# Patient Record
Sex: Male | Born: 1950
Health system: Southern US, Community
[De-identification: ages and names within clinical notes are randomized; demographics above are authoritative.]

## PROBLEM LIST (undated history)

## (undated) DIAGNOSIS — I517 Cardiomegaly: Secondary | ICD-10-CM

## (undated) DIAGNOSIS — T7840XA Allergy, unspecified, initial encounter: Secondary | ICD-10-CM

## (undated) DIAGNOSIS — I639 Cerebral infarction, unspecified: Secondary | ICD-10-CM

## (undated) DIAGNOSIS — I10 Essential (primary) hypertension: Secondary | ICD-10-CM

## (undated) DIAGNOSIS — G4700 Insomnia, unspecified: Secondary | ICD-10-CM

## (undated) HISTORY — DX: Essential (primary) hypertension: I10

## (undated) HISTORY — DX: Insomnia, unspecified: G47.00

## (undated) HISTORY — DX: Cerebral infarction, unspecified: I63.9

## (undated) HISTORY — PX: KNEE SURGERY: SHX244

## (undated) HISTORY — DX: Allergy, unspecified, initial encounter: T78.40XA

## (undated) HISTORY — PX: ELBOW SURGERY: SHX618

## (undated) HISTORY — PX: HERNIA REPAIR: SHX51

## (undated) HISTORY — DX: Cardiomegaly: I51.7

## (undated) HISTORY — PX: COLONOSCOPY: SHX174

---

## 2000-02-08 ENCOUNTER — Encounter: Payer: Self-pay | Admitting: Internal Medicine

## 2000-02-08 ENCOUNTER — Encounter: Admission: RE | Admit: 2000-02-08 | Discharge: 2000-02-08 | Payer: Self-pay | Admitting: Internal Medicine

## 2001-09-02 ENCOUNTER — Encounter: Admission: RE | Admit: 2001-09-02 | Discharge: 2001-12-01 | Payer: Self-pay | Admitting: Internal Medicine

## 2002-10-08 LAB — HM COLONOSCOPY

## 2006-08-07 ENCOUNTER — Ambulatory Visit: Payer: Self-pay

## 2007-11-10 ENCOUNTER — Ambulatory Visit: Payer: Self-pay | Admitting: Internal Medicine

## 2008-03-17 ENCOUNTER — Ambulatory Visit: Payer: Self-pay | Admitting: Internal Medicine

## 2008-07-19 ENCOUNTER — Ambulatory Visit: Payer: Self-pay | Admitting: Internal Medicine

## 2008-08-16 ENCOUNTER — Ambulatory Visit: Payer: Self-pay | Admitting: Internal Medicine

## 2008-12-20 ENCOUNTER — Ambulatory Visit: Payer: Self-pay | Admitting: Internal Medicine

## 2009-02-06 ENCOUNTER — Ambulatory Visit: Payer: Self-pay | Admitting: Internal Medicine

## 2009-06-02 ENCOUNTER — Ambulatory Visit: Payer: Self-pay | Admitting: Internal Medicine

## 2009-12-26 ENCOUNTER — Ambulatory Visit: Payer: Self-pay | Admitting: Internal Medicine

## 2010-03-23 ENCOUNTER — Ambulatory Visit: Payer: BC Managed Care – PPO | Admitting: Internal Medicine

## 2010-06-19 ENCOUNTER — Other Ambulatory Visit: Payer: Self-pay | Admitting: Internal Medicine

## 2010-06-19 DIAGNOSIS — I10 Essential (primary) hypertension: Secondary | ICD-10-CM

## 2010-06-29 ENCOUNTER — Encounter: Payer: Self-pay | Admitting: Internal Medicine

## 2010-06-29 ENCOUNTER — Ambulatory Visit (INDEPENDENT_AMBULATORY_CARE_PROVIDER_SITE_OTHER): Payer: PRIVATE HEALTH INSURANCE | Admitting: Internal Medicine

## 2010-06-29 VITALS — BP 142/84 | HR 64 | Temp 97.2°F | Ht 64.25 in | Wt 200.0 lb

## 2010-06-29 DIAGNOSIS — E119 Type 2 diabetes mellitus without complications: Secondary | ICD-10-CM | POA: Insufficient documentation

## 2010-06-29 DIAGNOSIS — I10 Essential (primary) hypertension: Secondary | ICD-10-CM | POA: Insufficient documentation

## 2010-06-29 LAB — HEMOGLOBIN A1C
Hgb A1c MFr Bld: 6.7 % — ABNORMAL HIGH (ref <5.7)
Mean Plasma Glucose: 146 mg/dL — ABNORMAL HIGH (ref <117)

## 2010-06-29 NOTE — Patient Instructions (Signed)
Take meds as directed. Return in 6 months. Diet and exercise

## 2010-06-29 NOTE — Progress Notes (Signed)
  Subjective:    Patient ID: Mike Parker, male    DOB: August 30, 1950, 60 y.o.   MRN: [id number]  HPI For 6 month follow up of AODM and HTN. Has lost 5 lbs in past year. 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 Eating better. Diabetic eye exam Nov.2011.    Review of Systems     Objective:   Physical Exam BP rechecked is 120/70. Chest clear, Cor:RRR, Ext without edema. Diabetic foot exam: No ulcers. Pulses normal.        Assessment & Plan:  1-HTN-continue same meds 2-AODM- HGB AIC pending. 3-Obesity- continue diet and exercise. RTC in 6 months for CPE.

## 2010-07-02 ENCOUNTER — Encounter: Payer: Self-pay | Admitting: Internal Medicine

## 2010-07-12 ENCOUNTER — Telehealth: Payer: Self-pay | Admitting: Internal Medicine

## 2010-07-13 ENCOUNTER — Other Ambulatory Visit: Payer: Self-pay

## 2010-07-13 MED ORDER — GLIPIZIDE 10 MG PO TABS: 10.0000 mg | ORAL_TABLET | Freq: Two times a day (BID) | ORAL | Status: DC

## 2010-07-13 NOTE — Telephone Encounter (Signed)
Please route these requests to [place] or [place]. The chart has to be pulled for me to do his written request.

## 2010-09-16 ENCOUNTER — Other Ambulatory Visit: Payer: Self-pay | Admitting: Internal Medicine

## 2010-09-16 DIAGNOSIS — I10 Essential (primary) hypertension: Secondary | ICD-10-CM

## 2011-01-01 ENCOUNTER — Ambulatory Visit (INDEPENDENT_AMBULATORY_CARE_PROVIDER_SITE_OTHER): Payer: PRIVATE HEALTH INSURANCE | Admitting: Internal Medicine

## 2011-01-01 ENCOUNTER — Encounter: Payer: Self-pay | Admitting: Internal Medicine

## 2011-01-01 DIAGNOSIS — I10 Essential (primary) hypertension: Secondary | ICD-10-CM

## 2011-01-01 DIAGNOSIS — E669 Obesity, unspecified: Secondary | ICD-10-CM

## 2011-01-01 DIAGNOSIS — Z0000 Encounter for general adult medical examination without abnormal findings: Secondary | ICD-10-CM

## 2011-01-01 LAB — LIPID PANEL
Cholesterol: 144 mg/dL (ref 0–200)
Total CHOL/HDL Ratio: 3.4 Ratio
Triglycerides: 107 mg/dL (ref <150)
VLDL: 21 mg/dL (ref 0–40)

## 2011-01-01 LAB — COMPREHENSIVE METABOLIC PANEL
ALT: 30 U/L (ref 0–53)
CO2: 30 mEq/L (ref 19–32)
Creat: 1.08 mg/dL (ref 0.50–1.35)
Glucose, Bld: 87 mg/dL (ref 70–99)
Total Bilirubin: 1.2 mg/dL (ref 0.3–1.2)

## 2011-01-01 LAB — CBC WITH DIFFERENTIAL/PLATELET
Eosinophils Absolute: 0.2 10*3/uL (ref 0.0–0.7)
Eosinophils Relative: 3 % (ref 0–5)
Lymphs Abs: 1.9 10*3/uL (ref 0.7–4.0)
MCH: 26.3 pg (ref 26.0–34.0)
MCHC: 32.6 g/dL (ref 30.0–36.0)
MCV: 80.6 fL (ref 78.0–100.0)
Platelets: 219 10*3/uL (ref 150–400)
RBC: 5.82 MIL/uL — ABNORMAL HIGH (ref 4.22–5.81)

## 2011-01-01 LAB — POCT URINALYSIS DIPSTICK
Bilirubin, UA: NEGATIVE
Blood, UA: NEGATIVE
Glucose, UA: NEGATIVE
Ketones, UA: NEGATIVE
Spec Grav, UA: 1.01

## 2011-01-02 DIAGNOSIS — E669 Obesity, unspecified: Secondary | ICD-10-CM | POA: Insufficient documentation

## 2011-01-02 NOTE — Patient Instructions (Signed)
We will make appointment for you to see urologist. Please continue to control diabetes with diet. Watch her diet. Continue taking same antihypertensive medication. Return in 6 months.

## 2011-01-02 NOTE — Progress Notes (Signed)
  Subjective:    Patient ID: Mike Parker, male    DOB: February 24, 1950, 60 y.o.   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 History of hypertension and diabetes. Is overweight. Has issues losing weight. Gets a fair amount of exercise with his work. Likes to snack at night and watch television. No other complaints or problems. Influenza immunization given today.  He is married. Has 2 adult daughters one of whom attends [employer] and the other one is attending [employer] school. She has a 53-year-old daughter and she and her daughter reside with Mike Parker and his Parker. Mike Parker works for  [employer]. Patient does not smoke or consume alcohol.    Review of Systems  Constitutional: Negative.   HENT: Negative.   Eyes: Negative.   Respiratory: Negative.   Cardiovascular: Negative.   Gastrointestinal: Negative.   Genitourinary: Negative.   Musculoskeletal: Negative.   Neurological: Negative.   Hematological: Negative.   Psychiatric/Behavioral: Negative.        Objective:   Physical Exam  Vitals reviewed. Constitutional: He is oriented to person, place, and time. He appears well-developed and well-nourished.  HENT:  Head: Normocephalic and atraumatic.  Right Ear: External ear normal.  Left Ear: External ear normal.  Mouth/Throat: Oropharynx is clear and moist.  Eyes: Conjunctivae and EOM are normal. Pupils are equal, round, and reactive to light. No scleral icterus.  Neck: Neck supple. No JVD present. No thyromegaly present.  Cardiovascular: Normal rate, regular rhythm, normal heart sounds and intact distal pulses.   No murmur heard. Pulmonary/Chest: Breath sounds normal. He has no wheezes. He has no rales.  Abdominal: Soft. Bowel sounds are normal. He exhibits no distension and no mass. There is no rebound and no guarding.  Genitourinary:       There is a slight ridge right lobe of prostate. No  frank nodule palpated. This seems to be a new finding.  Musculoskeletal: Normal range of motion. He exhibits no edema.       Diabetic foot exam: No ulcers pulses intact in feet  Lymphadenopathy:    He has no cervical adenopathy.  Neurological: He is alert and oriented to person, place, and time. He has normal reflexes. No cranial nerve deficit. Coordination normal.  Skin: Skin is warm and dry. No rash noted. He is not diaphoretic.  Psychiatric: He has a normal mood and affect. His behavior is normal.          Assessment & Plan:  Diet controlled diabetes mellitus  Hypertension  Obesity  New finding of ridge right lobe of prostate  Plan: Urology consult. Fasting labs drawn and are pending. Influenza immunization given. Further recommendations based on lab work findings. Return in 6 months at which time he'll have hemoglobin A1c and blood pressure check along with office visit.

## 2011-01-04 ENCOUNTER — Telehealth: Payer: Self-pay

## 2011-01-04 NOTE — Telephone Encounter (Signed)
Patient scheduled for an appointment with Dr. Retta Diones on February 15, 2011 at 1:30pm. Informed of this appointment.

## 2011-02-12 ENCOUNTER — Ambulatory Visit (INDEPENDENT_AMBULATORY_CARE_PROVIDER_SITE_OTHER): Payer: PRIVATE HEALTH INSURANCE

## 2011-02-12 DIAGNOSIS — H6500 Acute serous otitis media, unspecified ear: Secondary | ICD-10-CM

## 2011-03-18 ENCOUNTER — Ambulatory Visit (INDEPENDENT_AMBULATORY_CARE_PROVIDER_SITE_OTHER): Payer: PRIVATE HEALTH INSURANCE | Admitting: Internal Medicine

## 2011-03-18 ENCOUNTER — Encounter: Payer: Self-pay | Admitting: Internal Medicine

## 2011-03-18 VITALS — BP 162/86 | Temp 97.5°F | Wt 215.0 lb

## 2011-03-18 DIAGNOSIS — H6693 Otitis media, unspecified, bilateral: Secondary | ICD-10-CM

## 2011-03-18 DIAGNOSIS — I10 Essential (primary) hypertension: Secondary | ICD-10-CM

## 2011-03-18 DIAGNOSIS — E119 Type 2 diabetes mellitus without complications: Secondary | ICD-10-CM

## 2011-03-18 DIAGNOSIS — H6690 Otitis media, unspecified, unspecified ear: Secondary | ICD-10-CM

## 2011-03-18 DIAGNOSIS — J069 Acute upper respiratory infection, unspecified: Secondary | ICD-10-CM

## 2011-03-18 MED ORDER — SITAGLIPTIN PHOS-METFORMIN HCL 50-500 MG PO TABS: 1.0000 | ORAL_TABLET | Freq: Two times a day (BID) | ORAL | Status: DC

## 2011-03-20 ENCOUNTER — Encounter: Payer: Self-pay | Admitting: Internal Medicine

## 2011-03-20 NOTE — Patient Instructions (Addendum)
You have been provided with samples of Janumet. Mail order prescription has been filled out. For ear infection and respiratory infection take Biaxin 500 mg twice daily for 10 days. Call if not better in 10 days or sooner if worse.

## 2011-03-20 NOTE — Progress Notes (Signed)
  Subjective:    Patient ID: Mike Parker, male    DOB: Jul 10, 1950, 61 y.o.   MRN: [id number]  HPI  Patient came in today needing a 90 day prescription for Janumet. Complaining of right earache. Was seen on an acute basis. Patient has history of hypertension and diabetes mellitus. Has acute pain right ear. Says about a month ago, he was treated at an urgent care with amoxicillin but symptoms returned over the weekend. No fever or chills.    Review of Systems     Objective:   Physical Exam HEENT exam: Pharynx slightly injected; right TM is red; left TM dull and full; neck is supple without significant adenopathy; chest clear. Blood pressure is elevated presumably due to ear pain.        Assessment & Plan:  Bilateral otitis media  Upper respiratory infection  Hypertension  Diabetes mellitus  Plan: Samples of Janumet given today and prescription for mail order pharmacy provided. Prescribed Biaxin 500 mg by mouth twice daily for 10 days.

## 2011-04-11 ENCOUNTER — Ambulatory Visit (INDEPENDENT_AMBULATORY_CARE_PROVIDER_SITE_OTHER): Payer: PRIVATE HEALTH INSURANCE | Admitting: Family Medicine

## 2011-04-11 VITALS — BP 154/100 | HR 102 | Temp 97.7°F | Resp 18

## 2011-04-11 DIAGNOSIS — H6980 Other specified disorders of Eustachian tube, unspecified ear: Secondary | ICD-10-CM

## 2011-04-11 DIAGNOSIS — H9190 Unspecified hearing loss, unspecified ear: Secondary | ICD-10-CM

## 2011-04-11 DIAGNOSIS — H9120 Sudden idiopathic hearing loss, unspecified ear: Secondary | ICD-10-CM

## 2011-04-11 MED ORDER — FLUTICASONE PROPIONATE 50 MCG/ACT NA SUSP: 2.0000 | Freq: Every day | NASAL | Status: DC

## 2011-04-11 MED ORDER — PREDNISONE 20 MG PO TABS: ORAL_TABLET | ORAL | Status: AC

## 2011-04-11 NOTE — Progress Notes (Signed)
  Subjective:    Patient ID: Mike Parker, male    DOB: [birth year], 61 y.o.   MRN: [id number]  HPI 61 yo male with diabetes and hypertension with ear pain/pressure.  Had ear infection about 4-6 weeks ago.  Pain improved but never fully got hearing back.  Last 4-5 days seems more stopped up again.  Feeling some pressure but no real pain.  No fevers.  No cough or runny nose.   Diabetes under good control - 118 today and A1C 6.2  Review of Systems Negative except as per HPI     Objective:   Physical Exam  Constitutional: He appears well-developed. No distress.  HENT:  Right Ear: Tympanic membrane, external ear and ear canal normal. Tympanic membrane is not injected, not scarred, not perforated, not erythematous, not retracted and not bulging.  Left Ear: Tympanic membrane, external ear and ear canal normal. Tympanic membrane is not injected, not scarred, not perforated, not erythematous, not retracted and not bulging. Decreased hearing is noted.  Nose: No mucosal edema or rhinorrhea. Right sinus exhibits no maxillary sinus tenderness and no frontal sinus tenderness. Left sinus exhibits no maxillary sinus tenderness and no frontal sinus tenderness.  Mouth/Throat: Uvula is midline, oropharynx is clear and moist and mucous membranes are normal. No oropharyngeal exudate or tonsillar abscesses.  Cardiovascular: Normal rate, regular rhythm, normal heart sounds and intact distal pulses.   No murmur heard. Pulmonary/Chest: Effort normal and breath sounds normal. No respiratory distress. He has no wheezes. He has no rales.  Lymphadenopathy:       Head (right side): No submandibular and no preauricular adenopathy present.       Head (left side): No submandibular and no preauricular adenopathy present.       Right cervical: No superficial cervical and no posterior cervical adenopathy present.      Left cervical: No superficial cervical and no posterior cervical adenopathy present.       Right: No  supraclavicular adenopathy present.       Left: No supraclavicular adenopathy present.  Skin: Skin is warm and dry.   Left TM dull with fluid behind drum Right TM slight fluid, otherwise normal       Assessment & Plan:  Ear pain/decreased hearing/ETD pred taper flonase

## 2011-05-09 ENCOUNTER — Encounter: Payer: Self-pay | Admitting: Internal Medicine

## 2011-05-09 ENCOUNTER — Ambulatory Visit (INDEPENDENT_AMBULATORY_CARE_PROVIDER_SITE_OTHER): Payer: PRIVATE HEALTH INSURANCE | Admitting: Internal Medicine

## 2011-05-09 VITALS — BP 136/90 | HR 76 | Temp 98.4°F | Wt 210.0 lb

## 2011-05-09 DIAGNOSIS — H6592 Unspecified nonsuppurative otitis media, left ear: Secondary | ICD-10-CM

## 2011-05-09 DIAGNOSIS — J309 Allergic rhinitis, unspecified: Secondary | ICD-10-CM

## 2011-05-09 DIAGNOSIS — H6590 Unspecified nonsuppurative otitis media, unspecified ear: Secondary | ICD-10-CM

## 2011-05-09 NOTE — Patient Instructions (Addendum)
Take Biaxin 500 mg twice daily for 10 days. Take six-day prednisone tapering course as directed. When she finished prednisone course, start Zyrtec 10 mg at bedtime. Use Flonase nasal spray 2 sprays in each nostril daily. Call back if not better in 2-3 weeks

## 2011-05-09 NOTE — Progress Notes (Signed)
  Subjective:    Patient ID: Mike Parker, male    DOB: [birth year], 61 y.o.   MRN: [id number]  HPI 61 year old black male with history of diabetes mellitus and hypertension in today with recurrent left ear pain. In mid February he was here complaining of left ear pain having had been recently treated at urgent care with amoxicillin which did not clear his symptoms. He was treated here in mid February with Biaxin 500 mg twice daily for 10 days. He says this helped. In mid March he went back to urgent care with left ear pain. He was treated with a short course of prednisone in a dosepak. He said this did not help very much. He is in today complaining of left ear pain once again. 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 Feels that he does have some allergy symptoms. The pollen is very bad right now. No fever. No cough or sore throat. No UTIs.    Review of Systems     Objective:   Physical Exam HEENT exam: He has very boggy nasal mucosa bilaterally. Right TM is clear with good light reflex. Left TM is chronically scarred and dull. Pharynx is clear. Neck is supple without adenopathy. Chest clear.        Assessment & Plan:  Left otitis media  Allergic rhinitis  Hypertension  Diabetes mellitus  Plan: Sterapred DS 10 mg 6 day dosepak. Biaxin 500 mg twice daily for 10 days. Once he finishes Sterapred dosepak he is to start immediately on Zyrtec 10 mg at bedtime. Flonase nasal spray 2 sprays in each nostril daily through the pollen season in late May early June. If symptoms persist, he will be referred for allergy testing/ENT referral.

## 2011-06-03 ENCOUNTER — Telehealth: Payer: Self-pay

## 2011-06-03 NOTE — Telephone Encounter (Signed)
Appointment scheduled with Dr. Lazarus Salines for Tues 06/03/2011 at 10:30. Patient informed, notes faxed.

## 2011-06-03 NOTE — Telephone Encounter (Signed)
Please make appt for patient to see Dr. Lazarus Salines. In serous otitis media refractory to Prednisone and antibiotics. Please send notes in EPIC. There are several including last Hx and PE

## 2011-06-03 NOTE — Telephone Encounter (Signed)
States left ear is no better, would like ref. To ENT

## 2011-06-13 ENCOUNTER — Other Ambulatory Visit: Payer: Self-pay

## 2011-06-13 DIAGNOSIS — I10 Essential (primary) hypertension: Secondary | ICD-10-CM

## 2011-07-04 ENCOUNTER — Ambulatory Visit (INDEPENDENT_AMBULATORY_CARE_PROVIDER_SITE_OTHER): Payer: PRIVATE HEALTH INSURANCE | Admitting: Internal Medicine

## 2011-07-04 ENCOUNTER — Encounter: Payer: Self-pay | Admitting: Internal Medicine

## 2011-07-04 VITALS — BP 132/82 | HR 72 | Temp 97.9°F | Wt 212.0 lb

## 2011-07-04 DIAGNOSIS — H6590 Unspecified nonsuppurative otitis media, unspecified ear: Secondary | ICD-10-CM

## 2011-07-04 DIAGNOSIS — E119 Type 2 diabetes mellitus without complications: Secondary | ICD-10-CM

## 2011-07-04 DIAGNOSIS — I10 Essential (primary) hypertension: Secondary | ICD-10-CM

## 2011-07-04 LAB — BASIC METABOLIC PANEL
BUN: 15 mg/dL (ref 6–23)
Calcium: 9.6 mg/dL (ref 8.4–10.5)
Potassium: 4.1 mEq/L (ref 3.5–5.3)
Sodium: 142 mEq/L (ref 135–145)

## 2011-07-04 NOTE — Patient Instructions (Addendum)
Please check Accu-Cheks at least once daily if not twice daily before breakfast and before supper. Watch diet. Try to lose weight and exercise. Continue same antihypertensive medication. Return in 6 months for physical exam.

## 2011-07-04 NOTE — Progress Notes (Signed)
  Subjective:    Patient ID: Mike Parker, male    DOB: 07-21-1950, 61 y.o.   MRN: [id number]  HPI 61 year old black male with history of hypertension and diabetes for six-month recheck. Under some stress with car repairs for his daughters. He went to see ENT physician regarding otalgia. He may have to have a tube placed in his ear. He has appointment in the near future for followup on that. He's been advised to use Zyrtec and Flonase nasal spray and to clear his ears by holding his nose in blowing several times daily. He has not been checking his Accu-Cheks on her regular basis. Says he ran out of test strips. New prescription today for diabetic test strips with when necessary 1 year refill.    Review of Systems     Objective:   Physical Exam neck is supple without thyromegaly or JVD; chest clear; cardiac exam regular rate and rhythm; extremities without edema. Diabetic foot exam: no ulcers, pulses are normal  HEENT: Slight fullness in both ears      Assessment & Plan:  Type 2 diabetes mellitus-fairly well controlled but needs to be serious about checking Accu-Cheks on a regular basis  Allergic rhinitis-consider allergy testing  Hypertension-stable  Obesity-needs to diet and exercise  Serous otitis media-followup with ENT  Plan: Patient will return in 6 months for physical exam. Fasting labs drawn today and are pending. New prescription for diabetic test strips.

## 2011-07-05 LAB — HEMOGLOBIN A1C: Hgb A1c MFr Bld: 7.2 % — ABNORMAL HIGH (ref <5.7)

## 2011-08-25 ENCOUNTER — Other Ambulatory Visit: Payer: Self-pay | Admitting: Internal Medicine

## 2011-10-18 ENCOUNTER — Other Ambulatory Visit: Payer: Self-pay | Admitting: Internal Medicine

## 2012-01-06 ENCOUNTER — Other Ambulatory Visit: Payer: PRIVATE HEALTH INSURANCE | Admitting: Internal Medicine

## 2012-01-06 DIAGNOSIS — I10 Essential (primary) hypertension: Secondary | ICD-10-CM

## 2012-01-06 DIAGNOSIS — Z125 Encounter for screening for malignant neoplasm of prostate: Secondary | ICD-10-CM

## 2012-01-06 DIAGNOSIS — E119 Type 2 diabetes mellitus without complications: Secondary | ICD-10-CM

## 2012-01-06 LAB — COMPREHENSIVE METABOLIC PANEL
ALT: 36 U/L (ref 0–53)
AST: 21 U/L (ref 0–37)
Albumin: 4.3 g/dL (ref 3.5–5.2)
CO2: 28 mEq/L (ref 19–32)
Calcium: 9.7 mg/dL (ref 8.4–10.5)
Chloride: 102 mEq/L (ref 96–112)
Creat: 1.13 mg/dL (ref 0.50–1.35)
Potassium: 4.1 mEq/L (ref 3.5–5.3)
Sodium: 141 mEq/L (ref 135–145)
Total Protein: 6.8 g/dL (ref 6.0–8.3)

## 2012-01-06 LAB — HEMOGLOBIN A1C
Hgb A1c MFr Bld: 7.2 % — ABNORMAL HIGH (ref <5.7)
Mean Plasma Glucose: 160 mg/dL — ABNORMAL HIGH (ref <117)

## 2012-01-06 LAB — CBC WITH DIFFERENTIAL/PLATELET
Eosinophils Relative: 3 % (ref 0–5)
Lymphocytes Relative: 24 % (ref 12–46)
Lymphs Abs: 1.7 10*3/uL (ref 0.7–4.0)
MCV: 78.1 fL (ref 78.0–100.0)
Neutro Abs: 4.4 10*3/uL (ref 1.7–7.7)
Platelets: 213 10*3/uL (ref 150–400)
RBC: 5.71 MIL/uL (ref 4.22–5.81)
WBC: 7 10*3/uL (ref 4.0–10.5)

## 2012-01-06 LAB — PSA: PSA: 1.05 ng/mL (ref <=4.00)

## 2012-01-06 LAB — LIPID PANEL: Cholesterol: 142 mg/dL (ref 0–200)

## 2012-01-07 ENCOUNTER — Ambulatory Visit (INDEPENDENT_AMBULATORY_CARE_PROVIDER_SITE_OTHER): Payer: PRIVATE HEALTH INSURANCE | Admitting: Internal Medicine

## 2012-01-07 ENCOUNTER — Encounter: Payer: Self-pay | Admitting: Internal Medicine

## 2012-01-07 VITALS — BP 138/84 | HR 72 | Resp 12 | Ht 64.0 in | Wt 213.0 lb

## 2012-01-07 DIAGNOSIS — Z23 Encounter for immunization: Secondary | ICD-10-CM

## 2012-01-07 DIAGNOSIS — E669 Obesity, unspecified: Secondary | ICD-10-CM

## 2012-01-07 DIAGNOSIS — E119 Type 2 diabetes mellitus without complications: Secondary | ICD-10-CM

## 2012-01-07 DIAGNOSIS — I10 Essential (primary) hypertension: Secondary | ICD-10-CM

## 2012-01-07 LAB — POCT URINALYSIS DIPSTICK
Bilirubin, UA: NEGATIVE
Blood, UA: NEGATIVE
Leukocytes, UA: NEGATIVE
Nitrite, UA: NEGATIVE
Protein, UA: NEGATIVE
pH, UA: 6

## 2012-01-07 MED ORDER — GLIPIZIDE ER 10 MG PO TB24: 10.0000 mg | ORAL_TABLET | Freq: Every day | ORAL | Status: DC

## 2012-01-29 DIAGNOSIS — I639 Cerebral infarction, unspecified: Secondary | ICD-10-CM

## 2012-01-29 HISTORY — DX: Cerebral infarction, unspecified: I63.9

## 2012-03-10 ENCOUNTER — Ambulatory Visit (INDEPENDENT_AMBULATORY_CARE_PROVIDER_SITE_OTHER): Payer: PRIVATE HEALTH INSURANCE | Admitting: Emergency Medicine

## 2012-03-10 VITALS — BP 164/95 | HR 81 | Temp 98.5°F | Resp 18 | Wt 221.0 lb

## 2012-03-10 DIAGNOSIS — M67919 Unspecified disorder of synovium and tendon, unspecified shoulder: Secondary | ICD-10-CM

## 2012-03-10 DIAGNOSIS — M7550 Bursitis of unspecified shoulder: Secondary | ICD-10-CM

## 2012-03-10 DIAGNOSIS — M25519 Pain in unspecified shoulder: Secondary | ICD-10-CM

## 2012-03-10 DIAGNOSIS — S139XXA Sprain of joints and ligaments of unspecified parts of neck, initial encounter: Secondary | ICD-10-CM

## 2012-03-10 MED ORDER — CYCLOBENZAPRINE HCL 10 MG PO TABS: 10.0000 mg | ORAL_TABLET | Freq: Three times a day (TID) | ORAL | Status: DC | PRN

## 2012-03-10 MED ORDER — METHYLPREDNISOLONE ACETATE 80 MG/ML IJ SUSP: 80.0000 mg | Freq: Once | INTRAMUSCULAR | Status: DC

## 2012-03-10 MED ORDER — NAPROXEN SODIUM 550 MG PO TABS: 550.0000 mg | ORAL_TABLET | Freq: Two times a day (BID) | ORAL | Status: DC

## 2012-03-10 NOTE — Patient Instructions (Addendum)
Cervical Sprain A cervical sprain is an injury in the neck in which the ligaments are stretched or torn. The ligaments are the tissues that hold the bones of the neck (vertebrae) in place.Cervical sprains can range from very mild to very severe. Most cervical sprains get better in 1 to 3 weeks, but it depends on the cause and extent of the injury. Severe cervical sprains can cause the neck vertebrae to be unstable. This can lead to damage of the spinal cord and can result in serious nervous system problems. Your caregiver will determine whether your cervical sprain is mild or severe. CAUSES  Severe cervical sprains may be caused by:  Contact sport injuries (football, rugby, wrestling, hockey, auto racing, gymnastics, diving, martial arts, boxing).  Motor vehicle collisions.  Whiplash injuries. This means the neck is forcefully whipped backward and forward.  Falls. Mild cervical sprains may be caused by:   Awkward positions, such as cradling a telephone between your ear and shoulder.  Sitting in a chair that does not offer proper support.  Working at a poorly designed computer station.  Activities that require looking up or down for long periods of time. SYMPTOMS   Pain, soreness, stiffness, or a burning sensation in the front, back, or sides of the neck. This discomfort may develop immediately after injury or it may develop slowly and not begin for 24 hours or more after an injury.  Pain or tenderness directly in the middle of the back of the neck.  Shoulder or upper back pain.  Limited ability to move the neck.  Headache.  Dizziness.  Weakness, numbness, or tingling in the hands or arms.  Muscle spasms.  Difficulty swallowing or chewing.  Tenderness and swelling of the neck. DIAGNOSIS  Most of the time, your caregiver can diagnose this problem by taking your history and doing a physical exam. Your caregiver will ask about any known problems, such as arthritis in the neck  or a previous neck injury. X-rays may be taken to find out if there are any other problems, such as problems with the bones of the neck. However, an X-ray often does not reveal the full extent of a cervical sprain. Other tests such as a computed tomography (CT) scan or magnetic resonance imaging (MRI) may be needed. TREATMENT  Treatment depends on the severity of the cervical sprain. Mild sprains can be treated with rest, keeping the neck in place (immobilization), and pain medicines. Severe cervical sprains need immediate immobilization and an appointment with an orthopedist or neurosurgeon. Several treatment options are available to help with pain, muscle spasms, and other symptoms. Your caregiver may prescribe:  Medicines, such as pain relievers, numbing medicines, or muscle relaxants.  Physical therapy. This can include stretching exercises, strengthening exercises, and posture training. Exercises and improved posture can help stabilize the neck, strengthen muscles, and help stop symptoms from returning.  A neck collar to be worn for short periods of time. Often, these collars are worn for comfort. However, certain collars may be worn to protect the neck and prevent further worsening of a serious cervical sprain. HOME CARE INSTRUCTIONS   Put ice on the injured area.  Put ice in a plastic bag.  Place a towel between your skin and the bag.  Leave the ice on for 15 to 20 minutes, 3 to 4 times a day.  Only take over-the-counter or prescription medicines for pain, discomfort, or fever as directed by your caregiver.  Keep all follow-up appointments as directed by your   caregiver.  Keep all physical therapy appointments as directed by your caregiver.  If a neck collar is prescribed, wear it as directed by your caregiver.  Do not drive while wearing a neck collar.  Make any needed adjustments to your work station to promote good posture.  Avoid positions and activities that make your  symptoms worse.  Warm up and stretch before being active to help prevent problems. SEEK MEDICAL CARE IF:   Your pain is not controlled with medicine.  You are unable to decrease your pain medicine over time as planned.  Your activity level is not improving as expected. SEEK IMMEDIATE MEDICAL CARE IF:   You develop any bleeding, stomach upset, or signs of an allergic reaction to your medicine.  Your symptoms get worse.  You develop new, unexplained symptoms.  You have numbness, tingling, weakness, or paralysis in any part of your body. MAKE SURE YOU:   Understand these instructions.  Will watch your condition.  Will get help right away if you are not doing well or get worse. Document Released: 11/11/2006 Document Revised: 04/08/2011 Document Reviewed: 10/17/2010 [facility] Patient Information 2013 [place], [place]. Bursitis Bursitis is a swelling and soreness (inflammation) of a fluid-filled sac (bursa) that overlies and protects a joint. It can be caused by injury, overuse of the joint, arthritis or infection. The joints most likely to be affected are the elbows, shoulders, hips and knees. HOME CARE INSTRUCTIONS   Apply ice to the affected area for 15 to 20 minutes each hour while awake for 2 days. Put the ice in a plastic bag and place a towel between the bag of ice and your skin.  Rest the injured joint as much as possible, but continue to put the joint through a full range of motion, 4 times per day. (The shoulder joint especially becomes rapidly "frozen" if not used.) When the pain lessens, begin normal slow movements and usual activities.  Only take over-the-counter or prescription medicines for pain, discomfort or fever as directed by your caregiver.  Your caregiver may recommend draining the bursa and injecting medicine into the bursa. This may help the healing process.  Follow all instructions for follow-up with your caregiver. This includes any orthopedic referrals,  physical therapy and rehabilitation. Any delay in obtaining necessary care could result in a delay or failure of the bursitis to heal and chronic pain. SEEK IMMEDIATE MEDICAL CARE IF:   Your pain increases even during treatment.  You develop an oral temperature above 102 F (38.9 C) and have heat and inflammation over the involved bursa. MAKE SURE YOU:   Understand these instructions.  Will watch your condition.  Will get help right away if you are not doing well or get worse. Document Released: 01/12/2000 Document Revised: 04/08/2011 Document Reviewed: 12/16/2008 [facility] Patient Information 2013 [place], [place].

## 2012-03-10 NOTE — Progress Notes (Signed)
[facility] [address] [place] [address] [phone]  Date:  03/10/2012   Name:  Mike Parker   DOB:  01-07-1951   MRN:  [id number]  PCP:  Margaree Mackintosh, MD    Chief Complaint: Neck Pain and Shoulder Pain   History of Present Illness:  Mike Parker is a 62 y.o. 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  Says the package slipped and he was unable to hold it with his right arm.  Has persistently been painful.  Also has pain in neck when he awoke a week ago.  Has pain in neck.  No radiation of pain or numbness, weakness or tingling in arm or leg.  No improvement with NSAID.  Can't turn his head to the left.  Patient Active Problem List  Diagnosis  . Hypertension  . Diabetes mellitus  . Obesity  . Serous otitis media    Past Medical History  Diagnosis Date  . Hypertension   . Allergy   . Diabetes mellitus   . Insomnia     Past Surgical History  Procedure Laterality Date  . Hernia repair      left inguinal  . Knee surgery      rt and left  . Elbow surgery      rt elbow    History  Substance Use Topics  . Smoking status: Never Smoker   . Smokeless tobacco: Not on file  . Alcohol Use: No    Family History  Problem Relation Age of Onset  . Stroke Mother   . Hypertension Brother     No Known Allergies  Medication list has been reviewed and updated.  Current Outpatient Prescriptions on File Prior to Visit  Medication Sig Dispense Refill  . amLODipine (NORVASC) 5 MG tablet TAKE ONE TABLET BY MOUTH EVERY DAY  90 tablet  3  . atenolol (TENORMIN) 50 MG tablet Take 50 mg by mouth daily.        Marland Kitchen FREESTYLE LITE test strip       . glipiZIDE (GLUCOTROL XL) 10 MG 24 hr tablet Take 1 tablet (10 mg total) by mouth daily.  90 tablet  3  . Lancets (FREESTYLE) lancets       . lisinopril-hydrochlorothiazide  (PRINZIDE,ZESTORETIC) 20-25 MG per tablet TAKE ONE TABLET BY MOUTH EVERY DAY  90 tablet  3  . sitaGLIPtan-metformin (JANUMET) 50-500 MG per tablet Take 1 tablet by mouth 2 (two) times daily with a meal.  180 tablet  3  . cetirizine (ZYRTEC) 10 MG tablet Take 10 mg by mouth daily as needed.        . fluticasone (FLONASE) 50 MCG/ACT nasal spray Place 2 sprays into the nose daily.  16 g  6   No current facility-administered medications on file prior to visit.    Review of Systems:  As per HPI, otherwise negative.    Physical Examination: Filed Vitals:   03/10/12 0759  BP: 164/95  Pulse: 81  Temp: 98.5 F (36.9 C)  Resp: 18   Filed Vitals:   03/10/12 0759  Weight: 221 lb (100.245 kg)   Body mass index is 37.92 kg/(m^2). Ideal Body Weight:     GEN: WDWN, NAD, Non-toxic, Alert & Oriented x 3 HEENT: Atraumatic, Normocephalic.  Ears and Nose: No external deformity. EXTR: No clubbing/cyanosis/edema NEURO:  Normal gait.  PSYCH: Normally interactive. Conversant. Not depressed or anxious appearing.  Calm demeanor.  Neck:  Tender right trapezius with spasm. RIGHT shoulder:  Tender anterior shoulder bursa.  Limited elevation of arm.  Can't raise overhead  Assessment and Plan: Shoulder bursitis Cervical strain Inject shoulder bursa with 1 ml depo medrol and 1 ml marcaine 2% Anaprox Flexeril Local heat  [name], MD

## 2012-03-20 ENCOUNTER — Other Ambulatory Visit: Payer: Self-pay

## 2012-03-20 DIAGNOSIS — E119 Type 2 diabetes mellitus without complications: Secondary | ICD-10-CM

## 2012-03-20 MED ORDER — ATENOLOL 50 MG PO TABS: 50.0000 mg | ORAL_TABLET | Freq: Every day | ORAL | Status: DC

## 2012-03-20 MED ORDER — GLIPIZIDE ER 10 MG PO TB24: 10.0000 mg | ORAL_TABLET | Freq: Every day | ORAL | Status: DC

## 2012-04-26 NOTE — Patient Instructions (Addendum)
Continue same meds and return in 6 months Try to diet, exercise and lose weight.

## 2012-04-26 NOTE — Progress Notes (Signed)
  Subjective:    Patient ID: Mike Parker, male    DOB: [birth year], 62 y.o.   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 History of HTN and AODM. Is overweight and does not stick to strict diabetic diet. 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 Does not smoke or consume alcohol. Married with 2 adult daughters one of whom attends [employer]. One granddaughter.    Review of Systems  Constitutional: Negative.   HENT: Negative.   Eyes: Negative.   Respiratory: Negative.   Cardiovascular:       HTN  Gastrointestinal: Negative.   Endocrine:       Snacks at night and on weekends- likes potato chips  Allergic/Immunologic: Negative.   Neurological: Negative.   Hematological: Negative.   Psychiatric/Behavioral: Negative.        Objective:   Physical Exam  Vitals reviewed. Constitutional: He is oriented to person, place, and time. He appears well-developed and well-nourished. No distress.  HENT:  Head: Normocephalic and atraumatic.  Right Ear: External ear normal.  Left Ear: External ear normal.  Mouth/Throat: Oropharynx is clear and moist. No oropharyngeal exudate.  Eyes: Conjunctivae and EOM are normal. Pupils are equal, round, and reactive to light. Right eye exhibits no discharge. Left eye exhibits no discharge. No scleral icterus.  Neck: Neck supple. No JVD present.  Cardiovascular: Normal rate, regular rhythm, normal heart sounds and intact distal pulses.   No murmur heard. Pulmonary/Chest: Effort normal and breath sounds normal. No respiratory distress. He has no rales.  Abdominal: Soft. Bowel sounds are normal. He exhibits no distension and no mass. There is no tenderness. There is no rebound and no guarding.  Genitourinary: Prostate normal.  Musculoskeletal: Normal range of motion. He exhibits no edema.  Lymphadenopathy:    He has no cervical adenopathy.  Neurological:  He is alert and oriented to person, place, and time. He has normal reflexes. He displays normal reflexes. No cranial nerve deficit. He exhibits normal muscle tone. Coordination normal.  Skin: No rash noted. He is not diaphoretic.  Psychiatric: He has a normal mood and affect. His behavior is normal. Judgment and thought content normal.          Assessment & Plan:  HTN AODM Obesity Plan: Encourage diet and exercise once again. Needs to lose weight. Return in 6 months

## 2012-05-11 ENCOUNTER — Other Ambulatory Visit: Payer: Self-pay | Admitting: Internal Medicine

## 2012-07-10 ENCOUNTER — Ambulatory Visit (INDEPENDENT_AMBULATORY_CARE_PROVIDER_SITE_OTHER): Payer: PRIVATE HEALTH INSURANCE | Admitting: Internal Medicine

## 2012-07-10 ENCOUNTER — Encounter: Payer: Self-pay | Admitting: Internal Medicine

## 2012-07-10 VITALS — BP 160/84 | HR 82 | Temp 97.7°F | Wt 213.0 lb

## 2012-07-10 DIAGNOSIS — E119 Type 2 diabetes mellitus without complications: Secondary | ICD-10-CM

## 2012-07-10 DIAGNOSIS — Z8673 Personal history of transient ischemic attack (TIA), and cerebral infarction without residual deficits: Secondary | ICD-10-CM

## 2012-07-10 DIAGNOSIS — I10 Essential (primary) hypertension: Secondary | ICD-10-CM

## 2012-07-11 ENCOUNTER — Other Ambulatory Visit: Payer: Self-pay | Admitting: Internal Medicine

## 2012-07-14 ENCOUNTER — Observation Stay (HOSPITAL_COMMUNITY): Payer: 59

## 2012-07-14 ENCOUNTER — Emergency Department (HOSPITAL_COMMUNITY): Payer: 59

## 2012-07-14 ENCOUNTER — Encounter (HOSPITAL_COMMUNITY): Payer: Self-pay | Admitting: *Deleted

## 2012-07-14 ENCOUNTER — Inpatient Hospital Stay (HOSPITAL_COMMUNITY)
Admission: EM | Admit: 2012-07-14 | Discharge: 2012-07-15 | DRG: 066 | Disposition: A | Discharge status: Home / Self Care | Payer: 59 | Attending: Internal Medicine | Admitting: Internal Medicine

## 2012-07-14 DIAGNOSIS — E669 Obesity, unspecified: Secondary | ICD-10-CM

## 2012-07-14 DIAGNOSIS — I10 Essential (primary) hypertension: Secondary | ICD-10-CM | POA: Diagnosis present

## 2012-07-14 DIAGNOSIS — G459 Transient cerebral ischemic attack, unspecified: Secondary | ICD-10-CM

## 2012-07-14 DIAGNOSIS — E119 Type 2 diabetes mellitus without complications: Secondary | ICD-10-CM | POA: Diagnosis present

## 2012-07-14 DIAGNOSIS — I6350 Cerebral infarction due to unspecified occlusion or stenosis of unspecified cerebral artery: Principal | ICD-10-CM | POA: Diagnosis present

## 2012-07-14 DIAGNOSIS — R200 Anesthesia of skin: Secondary | ICD-10-CM | POA: Diagnosis present

## 2012-07-14 DIAGNOSIS — Z79899 Other long term (current) drug therapy: Secondary | ICD-10-CM

## 2012-07-14 DIAGNOSIS — I639 Cerebral infarction, unspecified: Secondary | ICD-10-CM | POA: Diagnosis present

## 2012-07-14 DIAGNOSIS — R209 Unspecified disturbances of skin sensation: Secondary | ICD-10-CM

## 2012-07-14 DIAGNOSIS — I517 Cardiomegaly: Secondary | ICD-10-CM

## 2012-07-14 DIAGNOSIS — R202 Paresthesia of skin: Secondary | ICD-10-CM | POA: Diagnosis present

## 2012-07-14 LAB — CBC WITH DIFFERENTIAL/PLATELET
Basophils Absolute: 0 10*3/uL (ref 0.0–0.1)
Basophils Relative: 0 % (ref 0–1)
Eosinophils Absolute: 0.1 10*3/uL (ref 0.0–0.7)
Eosinophils Relative: 2 % (ref 0–5)
HCT: 46.9 % (ref 39.0–52.0)
Hemoglobin: 15.8 g/dL (ref 13.0–17.0)
Lymphocytes Relative: 20 % (ref 12–46)
Lymphs Abs: 1.4 10*3/uL (ref 0.7–4.0)
MCH: 26.6 pg (ref 26.0–34.0)
MCHC: 33.7 g/dL (ref 30.0–36.0)
MCV: 79 fL (ref 78.0–100.0)
Monocytes Absolute: 0.5 10*3/uL (ref 0.1–1.0)
Monocytes Relative: 7 % (ref 3–12)
Neutro Abs: 5 10*3/uL (ref 1.7–7.7)
Neutrophils Relative %: 71 % (ref 43–77)
Platelets: 201 10*3/uL (ref 150–400)
RBC: 5.94 MIL/uL — ABNORMAL HIGH (ref 4.22–5.81)
RDW: 13.7 % (ref 11.5–15.5)
WBC: 7.1 10*3/uL (ref 4.0–10.5)

## 2012-07-14 LAB — BASIC METABOLIC PANEL
BUN: 19 mg/dL (ref 6–23)
CO2: 22 mEq/L (ref 19–32)
Calcium: 9.7 mg/dL (ref 8.4–10.5)
Chloride: 100 mEq/L (ref 96–112)
Creatinine, Ser: 0.91 mg/dL (ref 0.50–1.35)
GFR calc Af Amer: >90 mL/min (ref >90)
GFR calc non Af Amer: >90 mL/min (ref >90)
Glucose, Bld: 256 mg/dL — ABNORMAL HIGH (ref 70–99)
Potassium: 3.9 mEq/L (ref 3.5–5.1)
Sodium: 135 mEq/L (ref 135–145)

## 2012-07-14 LAB — RAPID URINE DRUG SCREEN, HOSP PERFORMED
Amphetamines: NOT DETECTED
Barbiturates: NOT DETECTED
Benzodiazepines: NOT DETECTED
Cocaine: NOT DETECTED
Opiates: NOT DETECTED
Tetrahydrocannabinol: NOT DETECTED

## 2012-07-14 LAB — GLUCOSE, CAPILLARY
Glucose-Capillary: 125 mg/dL — ABNORMAL HIGH (ref 70–99)
Glucose-Capillary: 197 mg/dL — ABNORMAL HIGH (ref 70–99)

## 2012-07-14 LAB — HEMOGLOBIN A1C
Hgb A1c MFr Bld: 7.5 % — ABNORMAL HIGH (ref <5.7)
Mean Plasma Glucose: 169 mg/dL — ABNORMAL HIGH (ref <117)

## 2012-07-14 LAB — MAGNESIUM: Magnesium: 2 mg/dL (ref 1.5–2.5)

## 2012-07-14 MED ORDER — INSULIN ASPART 100 UNIT/ML ~~LOC~~ SOLN: 0.0000 [IU] | Freq: Every day | SUBCUTANEOUS | Status: DC

## 2012-07-14 MED ORDER — INSULIN ASPART 100 UNIT/ML ~~LOC~~ SOLN
0.0000 [IU] | Freq: Three times a day (TID) | SUBCUTANEOUS | Status: DC
  Administered 2012-07-14: 2 [IU] via SUBCUTANEOUS
  Administered 2012-07-14 – 2012-07-15 (×3): 3 [IU] via SUBCUTANEOUS

## 2012-07-14 MED ORDER — ASPIRIN 325 MG PO TABS
325.0000 mg | ORAL_TABLET | Freq: Every day | ORAL | Status: DC
  Filled 2012-07-14: qty 1

## 2012-07-14 MED ORDER — GLIPIZIDE ER 10 MG PO TB24: 10.0000 mg | ORAL_TABLET | Freq: Every day | ORAL | Status: DC

## 2012-07-14 MED ORDER — ASPIRIN EC 81 MG PO TBEC
81.0000 mg | DELAYED_RELEASE_TABLET | Freq: Every day | ORAL | Status: DC
  Administered 2012-07-14 – 2012-07-15 (×2): 81 mg via ORAL
  Filled 2012-07-14 (×2): qty 1

## 2012-07-14 MED ORDER — ENOXAPARIN SODIUM 40 MG/0.4ML ~~LOC~~ SOLN
40.0000 mg | SUBCUTANEOUS | Status: DC
  Administered 2012-07-14 – 2012-07-15 (×2): 40 mg via SUBCUTANEOUS
  Filled 2012-07-14 (×2): qty 0.4

## 2012-07-14 MED ORDER — ATENOLOL 50 MG PO TABS
50.0000 mg | ORAL_TABLET | Freq: Every day | ORAL | Status: DC
  Administered 2012-07-14 – 2012-07-15 (×2): 50 mg via ORAL
  Filled 2012-07-14 (×2): qty 1

## 2012-07-14 MED ORDER — GLIPIZIDE ER 10 MG PO TB24
10.0000 mg | ORAL_TABLET | Freq: Every day | ORAL | Status: DC
  Administered 2012-07-14 – 2012-07-15 (×2): 10 mg via ORAL
  Filled 2012-07-14 (×3): qty 1

## 2012-07-14 NOTE — Progress Notes (Signed)
*  PRELIMINARY RESULTS* Vascular Ultrasound Carotid Duplex (Doppler) has been completed. There is no obvious evidence of hemodynamically significant internal carotid artery stenosis. Vertebral arteries are patent with antegrade flow.  07/14/2012 12:09 PM Gertie Fey, RVT, RDCS, RDMS

## 2012-07-14 NOTE — Progress Notes (Signed)
Report taken, pt in MRI, pt will come to [id number] when MRI complete.

## 2012-07-14 NOTE — ED Notes (Signed)
Attempted to call report. Receiving nurse with another patient. 

## 2012-07-14 NOTE — Progress Notes (Signed)
  Echocardiogram 2D Echocardiogram has been performed.  Devarious Pavek, [facility] 07/14/2012, 11:58 AM

## 2012-07-14 NOTE — Progress Notes (Signed)
UR completed 

## 2012-07-14 NOTE — ED Notes (Signed)
Pt c/o right sided numbness from face to feet; states symptoms started noon on Sunday; no change in gait; no other symptoms; equal facial grimacing; equal strength

## 2012-07-14 NOTE — ED Provider Notes (Signed)
History    61yM with R sided numbness. Onset Sunday and then resolved. Same symptoms again last night and have been persistent since. Numbness primarily in extremities and lesser extent in face. Denies weakness, visual changes or problems with gait/coordination. No confusion or changed speech per wife. No hx of similar symptoms. Past medical hx significant for HTN and DM.  CSN: [id number]  Arrival date & time 07/14/12  [id number]   First MD Initiated Contact with Patient 07/14/12 [phone]      Chief Complaint  Patient presents with  . Numbness    (Consider location/radiation/quality/duration/timing/severity/associated sxs/prior treatment) HPI  Past Medical History  Diagnosis Date  . Hypertension   . Allergy   . Diabetes mellitus   . Insomnia     Past Surgical History  Procedure Laterality Date  . Hernia repair      left inguinal  . Knee surgery      rt and left  . Elbow surgery      rt elbow    Family History  Problem Relation Age of Onset  . Stroke Mother   . Hypertension Brother     History  Substance Use Topics  . Smoking status: Never Smoker   . Smokeless tobacco: Not on file  . Alcohol Use: No      Review of Systems  All systems reviewed and negative, other than as noted in HPI.   Allergies  Review of patient's allergies indicates no known allergies.  Home Medications   Current Outpatient Rx  Name  Route  Sig  Dispense  Refill  . amLODipine (NORVASC) 5 MG tablet      TAKE 1 TABLET DAILY   90 tablet   3   . atenolol (TENORMIN) 50 MG tablet   Oral   Take 1 tablet (50 mg total) by mouth daily.   90 tablet   3   . cetirizine (ZYRTEC) 10 MG tablet   Oral   Take 10 mg by mouth daily as needed.           . cyclobenzaprine (FLEXERIL) 10 MG tablet   Oral   Take 1 tablet (10 mg total) by mouth 3 (three) times daily as needed for muscle spasms.   30 tablet   0   . FREESTYLE LITE test strip               . glipiZIDE (GLUCOTROL XL) 10 MG 24  hr tablet   Oral   Take 1 tablet (10 mg total) by mouth daily.   90 tablet   3   . JANUMET 50-500 MG per tablet      TAKE 1 TABLET TWICE A DAY   180 tablet   2     Dispense as written.   . Lancets (FREESTYLE) lancets               . lisinopril-hydrochlorothiazide (PRINZIDE,ZESTORETIC) 20-25 MG per tablet      TAKE ONE TABLET BY MOUTH EVERY DAY   90 tablet   3   . naproxen sodium (ANAPROX DS) 550 MG tablet   Oral   Take 1 tablet (550 mg total) by mouth 2 (two) times daily with a meal.   40 tablet   0     BP 176/88  Temp(Src) 97.9 F (36.6 C) (Oral)  Resp 10  Ht 5\' 5"  (1.651 m)  Wt 212 lb (96.163 kg)  BMI 35.28 kg/m2  SpO2 100%  Physical Exam  Nursing note and vitals reviewed.  Constitutional: He appears well-developed and well-nourished. No distress.  HENT:  Head: Normocephalic and atraumatic.  Eyes: Conjunctivae and EOM are normal. Pupils are equal, round, and reactive to light. Right eye exhibits no discharge. Left eye exhibits no discharge.  Neck: Neck supple.  Cardiovascular: Normal rate, regular rhythm and normal heart sounds.  Exam reveals no gallop and no friction rub.   No murmur heard. Pulmonary/Chest: Effort normal and breath sounds normal. No respiratory distress.  Abdominal: Soft. He exhibits no distension. There is no tenderness.  Musculoskeletal: He exhibits no edema and no tenderness.  Neurological: He is alert. No cranial nerve deficit. He exhibits normal muscle tone. Coordination normal.  Speech clear. Content appropriate. Good finger to nose b/l.   Skin: Skin is warm and dry.  Psychiatric: He has a normal mood and affect. His behavior is normal. Thought content normal.    ED Course  Procedures (including critical care time)  Labs Reviewed  BASIC METABOLIC PANEL - Abnormal; Notable for the following:    Glucose, Bld 256 (*)    All other components within normal limits  CBC WITH DIFFERENTIAL - Abnormal; Notable for the following:     RBC 5.94 (*)    All other components within normal limits  MAGNESIUM   Ct Head Wo Contrast  07/14/2012   *RADIOLOGY REPORT*  Clinical Data: Right-sided numbness.  CT HEAD WITHOUT CONTRAST  Technique:  Contiguous axial images were obtained from the base of the skull through the vertex without contrast.  Comparison: None.  Findings: The ventricles are normal.  No extra-axial fluid collections are seen.  The brainstem and cerebellum are unremarkable.  No acute intracranial findings such as infarction or hemorrhage.  No mass lesions.  The bony calvarium is intact.  The visualized paranasal sinuses and mastoid air cells are clear except for a small left mastoid effusion inferiorly.  IMPRESSION: No acute intracranial findings or mass lesions.   Original Report Authenticated By: Rudie Meyer, M.D.    EKG:  Rhythm: normal sinus Vent. rate 80 BPM PR interval 180 ms QRS duration 76 ms QT/QTc 384/443 ms ST segments: NS ST changes. Mild t wave flattening lateral precordial leads  Diagnosis: 1. Numbness 2. Hyperglycemia   MDM  61yM with stroke like symptoms. Plan EKG, basic labs, CT head. Likely admit.         Raeford Razor, MD 07/14/12 [phone]

## 2012-07-14 NOTE — Consult Note (Signed)
Referring Physician: Benjamine Mola    Chief Complaint: Stroke  HPI:                                                                                                                                         Mike Parker is an 62 y.o. male who presented to the ER after multiple episodes of right arm tingling.  Initially it started on Sunday afternoon at 12:45. His arm was tingling but quickly dissipated. This again occurred on Sunday night but this time included his leg and again resolved prior to going to bed.  He woke up on Monday feeling normal.  That night he noted right face, arm and leg tingling which did not go away and he called his insurance 9-1-1 number.  He continue to feel right face, arm and leg numbness but no weakness or other accompanying symptoms. Healso notes over the last week his blood pressure has been higher than normal--he usually is 130's/80 but has been running 160-170/90.     Date last known well: 6.15.14 Time last known well: 12:45 tPA Given: No: out of window  Past Medical History  Diagnosis Date  . Hypertension   . Allergy   . Diabetes mellitus   . Insomnia     Past Surgical History  Procedure Laterality Date  . Hernia repair      left inguinal  . Knee surgery      rt and left  . Elbow surgery      rt elbow    Family History  Problem Relation Age of Onset  . Stroke Mother   . Hypertension Brother    Social History:  reports that he has never smoked. He has never used smokeless tobacco. He reports that he does not drink alcohol or use illicit drugs.  Allergies: No Known Allergies  Medications:                                                                                                                           Prior to Admission:  Facility-administered medications prior to admission  Medication Dose Route Frequency Provider Last Rate Last Dose  . methylPREDNISolone acetate (DEPO-MEDROL) injection 80 mg  80 mg Intramuscular Once Phillips Odor, MD        Prescriptions prior to admission  Medication Sig Dispense Refill  . amLODipine (NORVASC) 5 MG tablet TAKE  1 TABLET DAILY  90 tablet  3  . atenolol (TENORMIN) 50 MG tablet Take 1 tablet (50 mg total) by mouth daily.  90 tablet  3  . cetirizine (ZYRTEC) 10 MG tablet Take 10 mg by mouth daily as needed.        Marland Kitchen FREESTYLE LITE test strip       . glipiZIDE (GLUCOTROL XL) 10 MG 24 hr tablet Take 1 tablet (10 mg total) by mouth daily.  90 tablet  3  . JANUMET 50-500 MG per tablet TAKE 1 TABLET TWICE A DAY  180 tablet  2  . Lancets (FREESTYLE) lancets       . lisinopril-hydrochlorothiazide (PRINZIDE,ZESTORETIC) 20-25 MG per tablet TAKE ONE TABLET BY MOUTH EVERY DAY  90 tablet  3   Scheduled: . aspirin  325 mg Oral Daily  . atenolol  50 mg Oral Daily  . enoxaparin (LOVENOX) injection  40 mg Subcutaneous Q24H  . glipiZIDE  10 mg Oral Q breakfast  . insulin aspart  0-15 Units Subcutaneous TID WC  . insulin aspart  0-5 Units Subcutaneous QHS    ROS:                                                                                                                                       History obtained from the patient  General ROS: negative for - chills, fatigue, fever, night sweats, weight gain or weight loss Psychological ROS: negative for - behavioral disorder, hallucinations, memory difficulties, mood swings or suicidal ideation Ophthalmic ROS: negative for - blurry vision, double vision, eye pain or loss of vision ENT ROS: negative for - epistaxis, nasal discharge, oral lesions, sore throat, tinnitus or vertigo Allergy and Immunology ROS: negative for - hives or itchy/watery eyes Hematological and Lymphatic ROS: negative for - bleeding problems, bruising or swollen lymph nodes Endocrine ROS: negative for - galactorrhea, hair pattern changes, polydipsia/polyuria or temperature intolerance Respiratory ROS: negative for - cough, hemoptysis, shortness of breath or wheezing Cardiovascular ROS:  negative for - chest pain, dyspnea on exertion, edema or irregular heartbeat Gastrointestinal ROS: negative for - abdominal pain, diarrhea, hematemesis, nausea/vomiting or stool incontinence Genito-Urinary ROS: negative for - dysuria, hematuria, incontinence or urinary frequency/urgency Musculoskeletal ROS: negative for - joint swelling or muscular weakness Neurological ROS: as noted in HPI Dermatological ROS: negative for rash and skin lesion changes  Neurologic Examination:  Blood pressure 179/83, pulse 106, temperature 98.4 F (36.9 C), temperature source Oral, resp. rate 18, height 5\' 5"  (1.651 m), weight 94.6 kg (208 lb 8.9 oz), SpO2 100.00%.  Mental Status: Alert, oriented, thought content appropriate.  Speech fluent without evidence of aphasia.  Able to follow 3 step commands without difficulty. Cranial Nerves: II: Discs flat bilaterally; Visual fields grossly normal, pupils equal, round, reactive to light and accommodation III,IV, VI: ptosis not present, extra-ocular motions intact bilaterally V,VII: smile symmetric, facial light touch sensation decreased on the right  IX,X: gag reflex present XI: bilateral shoulder shrug XII: midline tongue extension Motor: Right : Upper extremity   5/5    Left:     Upper extremity   5/5  Lower extremity   5/5     Lower extremity   5/5 Tone and bulk:normal tone throughout; no atrophy noted Sensory: Pinprick and light touch intact but decreased over the right arm and leg Plantars: Right: downgoing   Left: downgoing Cerebellar: normal finger-to-nose,  normal heel-to-shin test Gait: normal CV: pulses palpable throughout    Results for orders placed during the hospital encounter of 07/14/12 (from the past 48 hour(s))  BASIC METABOLIC PANEL     Status: Abnormal   Collection Time    07/14/12  7:25 AM      Result Value Range   Sodium 135  135 -  145 mEq/L   Potassium 3.9  3.5 - 5.1 mEq/L   Chloride 100  96 - 112 mEq/L   CO2 22  19 - 32 mEq/L   Glucose, Bld 256 (*) 70 - 99 mg/dL   BUN 19  6 - 23 mg/dL   Creatinine, Ser 2.13  0.50 - 1.35 mg/dL   Calcium 9.7  8.4 - 08.6 mg/dL   GFR calc non Af Amer >90  >90 mL/min   GFR calc Af Amer >90  >90 mL/min   Comment:            The eGFR has been calculated     using the CKD EPI equation.     This calculation has not been     validated in all clinical     situations.     eGFR's persistently     <90 mL/min signify     possible Chronic Kidney Disease.  CBC WITH DIFFERENTIAL     Status: Abnormal   Collection Time    07/14/12  7:25 AM      Result Value Range   WBC 7.1  4.0 - 10.5 K/uL   RBC 5.94 (*) 4.22 - 5.81 MIL/uL   Hemoglobin 15.8  13.0 - 17.0 g/dL   HCT 57.8  46.9 - 62.9 %   MCV 79.0  78.0 - 100.0 fL   MCH 26.6  26.0 - 34.0 pg   MCHC 33.7  30.0 - 36.0 g/dL   RDW 52.8  41.3 - 24.4 %   Platelets 201  150 - 400 K/uL   Neutrophils Relative % 71  43 - 77 %   Neutro Abs 5.0  1.7 - 7.7 K/uL   Lymphocytes Relative 20  12 - 46 %   Lymphs Abs 1.4  0.7 - 4.0 K/uL   Monocytes Relative 7  3 - 12 %   Monocytes Absolute 0.5  0.1 - 1.0 K/uL   Eosinophils Relative 2  0 - 5 %   Eosinophils Absolute 0.1  0.0 - 0.7 K/uL   Basophils Relative 0  0 - 1 %  Basophils Absolute 0.0  0.0 - 0.1 K/uL  MAGNESIUM     Status: None   Collection Time    07/14/12  7:25 AM      Result Value Range   Magnesium 2.0  1.5 - 2.5 mg/dL  HEMOGLOBIN Z6X     Status: Abnormal   Collection Time    07/14/12  7:25 AM      Result Value Range   Hemoglobin A1C 7.5 (*) <5.7 %   Comment: (NOTE)                                                                               According to the ADA Clinical Practice Recommendations for 2011, when     HbA1c is used as a screening test:      >=6.5%   Diagnostic of Diabetes Mellitus               (if abnormal result is confirmed)     5.7-6.4%   Increased risk of developing  Diabetes Mellitus     References:Diagnosis and Classification of Diabetes Mellitus,Diabetes     Care,2011,34(Suppl 1):S62-S69 and Standards of Medical Care in             Diabetes - 2011,Diabetes Care,2011,34 (Suppl 1):S11-S61.   Mean Plasma Glucose 169 (*) <117 mg/dL  GLUCOSE, CAPILLARY     Status: Abnormal   Collection Time    07/14/12 12:09 PM      Result Value Range   Glucose-Capillary 125 (*) 70 - 99 mg/dL  URINE RAPID DRUG SCREEN (HOSP PERFORMED)     Status: None   Collection Time    07/14/12  1:04 PM      Result Value Range   Opiates NONE DETECTED  NONE DETECTED   Cocaine NONE DETECTED  NONE DETECTED   Benzodiazepines NONE DETECTED  NONE DETECTED   Amphetamines NONE DETECTED  NONE DETECTED   Tetrahydrocannabinol NONE DETECTED  NONE DETECTED   Barbiturates NONE DETECTED  NONE DETECTED   Comment:            DRUG SCREEN FOR MEDICAL PURPOSES     ONLY.  IF CONFIRMATION IS NEEDED     FOR ANY PURPOSE, NOTIFY LAB     WITHIN 5 DAYS.                LOWEST DETECTABLE LIMITS     FOR URINE DRUG SCREEN     Drug Class       Cutoff (ng/mL)     Amphetamine      1000     Barbiturate      200     Benzodiazepine   200     Tricyclics       300     Opiates          300     Cocaine          300     THC              50   Ct Head Wo Contrast  07/14/2012   *RADIOLOGY REPORT*  Clinical Data: Right-sided numbness.  CT HEAD WITHOUT CONTRAST  Technique:  Contiguous axial images were obtained from  the base of the skull through the vertex without contrast.  Comparison: None.  Findings: The ventricles are normal.  No extra-axial fluid collections are seen.  The brainstem and cerebellum are unremarkable.  No acute intracranial findings such as infarction or hemorrhage.  No mass lesions.  The bony calvarium is intact.  The visualized paranasal sinuses and mastoid air cells are clear except for a small left mastoid effusion inferiorly.  IMPRESSION: No acute intracranial findings or mass lesions.   Original  Report Authenticated By: Rudie Meyer, M.D.     Mri Brain Without Contrast  07/14/2012   *RADIOLOGY REPORT*  Clinical Data:  Stroke and diabetes  MRI HEAD WITHOUT CONTRAST MRA HEAD WITHOUT CONTRAST  Technique:  Multiplanar, multiecho pulse sequences of the brain and surrounding structures were obtained without intravenous contrast. Angiographic images of the head were obtained using MRA technique without contrast.  Comparison:  CT 07/14/2012  MRI HEAD  Findings:  Acute infarct left lateral thalamus measuring 1 cm.  No other acute infarct.  Small hyperintensities in the frontal white matter and parietal white matter bilaterally consistent with chronic microvascular ischemia.  No cortical infarct.  Negative for hemorrhage or mass lesion.  Ventricle size is normal and there is no shift to the midline structures.  Mild left mastoid sinus effusion.  Negative for nasal pharyngeal mass.  IMPRESSION: Acute infarct left thalamus.  Mild chronic microvascular ischemic change in the white matter.  MRA HEAD  Findings: Both vertebral arteries are patent to the basilar.  PICA patent bilaterally.  Basilar is widely patent.  The superior cerebellar arteries are patent bilaterally.  Posterior cerebral arteries are patent bilaterally.  There is irregularity and mild to moderate disease in the left posterior cerebral artery.  Right PCA is patent.  Right cavernous carotid is patent with mild stenosis.  Right A1 segment is hypoplastic without significant contribution to the anterior cerebral artery.  Right middle cerebral artery widely patent.  Left cavernous carotid is widely patent.  Left anterior and middle cerebral arteries widely patent.  Both anterior cerebral arteries are supplied from the left A1 segment.  Negative for cerebral aneurysm.  IMPRESSION: Mild to moderate disease in the left posterior cerebral artery.  No large vessel occlusion.   Original Report Authenticated By: Janeece Riggers, M.D.   Vascular Ultrasound   Carotid Duplex (Doppler) has been completed.  There is no obvious evidence of hemodynamically significant internal carotid artery stenosis. Vertebral arteries are patent with antegrade flow.   2 D echo: Study Conclusions  - Left ventricle: The cavity size was normal. There was moderate concentric hypertrophy. Systolic function was normal. The estimated ejection fraction was in the range of 60% to 65%. Wall motion was normal; there were no regional wall motion abnormalities. - Left atrium: The atrium was mildly dilated. Transthoracic echocardiography.  Assessment and plan discussed with with attending physician and they are in agreement.    Felicie Morn PA-C [facility] [phone]  07/14/2012, 2:57 PM   Assessment: 62 y.o. male with acute left thalamic infarct of small vessel etiology. Patient was out of window for tPA at time of arrival to the ED.  Work up thus far has shown no large vessel occlusion, normal carotid doppler, elevated A1c of 7.5 and elevated BP.   Stroke Risk Factors - diabetes mellitus and hypertension  Plan: 1) FLP (goal LDL <100)    2. Prophylactic therapy-Antiplatelet med: Aspirin - dose 81 mg daily 3. Risk factor modification (BP control, Diabetes control, cholesterol) 4. Telemetry  monitoring 5. Frequent neuro checks 6. PT/OT   Felicie Morn PA-C [facility] [phone]  I personally participate in this patient's evaluation and management including a proven the above clinical assessment and management recommendations.  Venetia Maxon M.D. [facility] [phone]

## 2012-07-14 NOTE — ED Notes (Signed)
Patient transported to CT 

## 2012-07-14 NOTE — H&P (Addendum)
[facility] History and Physical  [name] ZOX:[id number] DOB: [birth year] DOA: 07/14/2012  Referring physician: er PCP: Mike Mackintosh, MD  Specialists:   Chief Complaint: numbness/tingling on right side of face/arm/leg  HPI: Mike Parker is a 63 y.o. male  With DM/HTN who comes in with R sided numbness that started Sunday- includes entire face, right arm and right leg.  The symptoms lessened but came back again last night.  His hearing increased when the symptoms started.  No dizziness, no gait problems, no visual changes.  Patient was up walking in room with no problems.  Never had an episode like this before DM and HTN well controlled at home No new medications No sick contacts  In the Er, his CT scan was negative and labs unremarkable minus an elevated glucose.  Hopsitalist were called for admission    Review of Systems: all systems reviewed by me, negative unless stated above    Past Medical History  Diagnosis Date  . Hypertension   . Allergy   . Diabetes mellitus   . Insomnia    Past Surgical History  Procedure Laterality Date  . Hernia repair      left inguinal  . Knee surgery      rt and left  . Elbow surgery      rt elbow   Social History:  reports that he has never smoked. He does not have any smokeless tobacco history on file. He reports that he does not drink alcohol or use illicit drugs.   No Known Allergies  Family History  Problem Relation Age of Onset  . Stroke Mother   . Hypertension Brother     Prior to Admission medications   Medication Sig Start Date End Date Taking? Authorizing Provider  amLODipine (NORVASC) 5 MG tablet TAKE 1 TABLET DAILY 07/11/12  Yes Mike Mackintosh, MD  atenolol (TENORMIN) 50 MG tablet Take 1 tablet (50 mg total) by mouth daily. 03/20/12  Yes Mike Mackintosh, MD  cetirizine (ZYRTEC) 10 MG tablet Take 10 mg by mouth daily as needed.     Yes Historical Provider, MD  FREESTYLE LITE test strip  11/23/11  Yes Historical  Provider, MD  glipiZIDE (GLUCOTROL XL) 10 MG 24 hr tablet Take 1 tablet (10 mg total) by mouth daily. 03/20/12 03/20/13 Yes Mike Mackintosh, MD  JANUMET 50-500 MG per tablet TAKE 1 TABLET TWICE A DAY 05/11/12  Yes Mike Mackintosh, MD  Lancets (FREESTYLE) lancets  11/23/11  Yes Historical Provider, MD  lisinopril-hydrochlorothiazide (PRINZIDE,ZESTORETIC) 20-25 MG per tablet TAKE ONE TABLET BY MOUTH EVERY DAY 10/18/11  Yes Mike Mackintosh, MD   Physical Exam: Filed Vitals:   07/14/12 0702 07/14/12 0736  BP: 176/88 161/88  Temp: 97.9 F (36.6 C)   TempSrc: Oral   Resp: 10 16  Height: 5\' 5"  (1.651 m)   Weight: 96.163 kg (212 lb)   SpO2: 100%      General:  A+Ox3, NAD  Eyes: wnl  ENT: wnl  Neck: supple  Cardiovascular: rrr  Respiratory: clear, no wheezing  Abdomen: +BS, soft, NT  Skin: no rashes or lesions  Musculoskeletal: moves all 4 ext  Psychiatric: normal mood/affect  Neurologic: CN 2-12 intact  Labs on Admission:  Basic Metabolic Panel:  Recent Labs Lab 07/14/12 0725  NA 135  K 3.9  CL 100  CO2 22  GLUCOSE 256*  BUN 19  CREATININE 0.91  CALCIUM 9.7  MG 2.0   Liver Function Tests:  No results found for this basename: AST, ALT, ALKPHOS, BILITOT, PROT, ALBUMIN,  in the last 168 hours No results found for this basename: LIPASE, AMYLASE,  in the last 168 hours No results found for this basename: AMMONIA,  in the last 168 hours CBC:  Recent Labs Lab 07/14/12 0725  WBC 7.1  NEUTROABS 5.0  HGB 15.8  HCT 46.9  MCV 79.0  PLT 201   Cardiac Enzymes: No results found for this basename: CKTOTAL, CKMB, CKMBINDEX, TROPONINI,  in the last 168 hours  BNP (last 3 results) No results found for this basename: PROBNP,  in the last 8760 hours CBG: No results found for this basename: GLUCAP,  in the last 168 hours  Radiological Exams on Admission: Ct Head Wo Contrast  07/14/2012   *RADIOLOGY REPORT*  Clinical Data: Right-sided numbness.  CT HEAD WITHOUT CONTRAST   Technique:  Contiguous axial images were obtained from the base of the skull through the vertex without contrast.  Comparison: None.  Findings: The ventricles are normal.  No extra-axial fluid collections are seen.  The brainstem and cerebellum are unremarkable.  No acute intracranial findings such as infarction or hemorrhage.  No mass lesions.  The bony calvarium is intact.  The visualized paranasal sinuses and mastoid air cells are clear except for a small left mastoid effusion inferiorly.  IMPRESSION: No acute intracranial findings or mass lesions.   Original Report Authenticated By: Rudie Meyer, M.D.      Assessment/Plan Active Problems:   Hypertension   Diabetes mellitus   Numbness and tingling   1. ?TIA- MRI/MRA, echo, carotids- FLP in AM; distribution is atypical but await MRI, no need for PT- patient up walking around, hold on neuro consult unless + results 2. DM- SSI, hold metformin, check HgbA1C 3. HTN- permissive HTN for now- resume some home meds-     Code Status: full Family Communication: wife at bedside Disposition Plan: tele obs  Time spent:  Alonnie Bieker [facility] Pager [phone]  If 7PM-7AM, please contact night-coverage [email or web address] Password [facility] 07/14/2012, 8:56 AM     Addendum: MRI was + for CVA- ASA started, work up in progress  [employer]

## 2012-07-14 NOTE — ED Notes (Signed)
Patient transported to MRI 

## 2012-07-14 NOTE — Progress Notes (Signed)
Called ED for report Misty Stanley states Silvio Pate is busy and will call unit back.

## 2012-07-15 DIAGNOSIS — I6350 Cerebral infarction due to unspecified occlusion or stenosis of unspecified cerebral artery: Principal | ICD-10-CM

## 2012-07-15 LAB — BASIC METABOLIC PANEL
BUN: 16 mg/dL (ref 6–23)
CO2: 30 mEq/L (ref 19–32)
Calcium: 10.3 mg/dL (ref 8.4–10.5)
GFR calc non Af Amer: 70 mL/min — ABNORMAL LOW (ref >90)
Glucose, Bld: 188 mg/dL — ABNORMAL HIGH (ref 70–99)

## 2012-07-15 LAB — CBC
HCT: 48.2 % (ref 39.0–52.0)
Hemoglobin: 15.7 g/dL (ref 13.0–17.0)
MCH: 25.7 pg — ABNORMAL LOW (ref 26.0–34.0)
MCHC: 32.6 g/dL (ref 30.0–36.0)
MCV: 78.8 fL (ref 78.0–100.0)

## 2012-07-15 LAB — LIPID PANEL
Cholesterol: 175 mg/dL (ref 0–200)
Triglycerides: 201 mg/dL — ABNORMAL HIGH (ref <150)

## 2012-07-15 LAB — GLUCOSE, CAPILLARY

## 2012-07-15 MED ORDER — GEMFIBROZIL 600 MG PO TABS: 600.0000 mg | ORAL_TABLET | Freq: Two times a day (BID) | ORAL | Status: DC

## 2012-07-15 MED ORDER — METFORMIN HCL 500 MG PO TABS: 500.0000 mg | ORAL_TABLET | Freq: Two times a day (BID) | ORAL | Status: DC

## 2012-07-15 MED ORDER — JANUMET 50-500 MG PO TABS: 2.0000 | ORAL_TABLET | Freq: Two times a day (BID) | ORAL | Status: DC

## 2012-07-15 MED ORDER — ASPIRIN 81 MG PO TBEC: 81.0000 mg | DELAYED_RELEASE_TABLET | Freq: Every day | ORAL | Status: AC

## 2012-07-15 NOTE — Plan of Care (Signed)
Problem: Phase I Progression Outcomes Goal: Strict NPO til swallow screen done Outcome: Completed/Met Date Met:  07/15/12 Passed swallow evaluation

## 2012-07-15 NOTE — Discharge Summary (Signed)
Physician Discharge Summary  Mike Parker ZOX:[id number] DOB: 12-23-1950 DOA: 07/14/2012  PCP: Margaree Mackintosh, MD  Admit date: 07/14/2012 Discharge date: 07/15/2012  Time spent: 40 minutes  Recommendations for Outpatient Follow-up:  1. Home with outpt PCP and neurology follow up  Discharge Diagnoses:   Principal Problem:   CVA (cerebral infarction)  Active Problems:   Hypertension   Diabetes mellitus   Numbness and tingling   Discharge Condition: fair  Diet recommendation: diabetic  Filed Weights   07/14/12 0702 07/14/12 1058  Weight: 96.163 kg (212 lb) 94.6 kg (208 lb 8.9 oz)    History of present illness:  Please refer to admission H&P for details but in brief, 62 y/o male with hx fo HTN, DM who presented with rt sided numbness involving the face, rt arm and leg for past 2 days. He did not have any weakness, dizziness, gait disturbances, chest pain, palpitations, nausea, vomiting or change in mental status. A CT head done in ED was unremarkable. Patient admitted for possible TIA/ stroke.   Hospital Course:  Acute CVA Patient admitted to medical floor on telemetry. Given full dose aspirin initially and placed on low-dose aspirin. Allowed permissive hypertension.  MRI of the brain showed acute left thalamic infarct measuring 1 cm. 2-D echo and carotid upper was normal. Continued by neurology and appreciate recommendations.  Patient counseled on the lifestyle modifications, adherence to diet and exercise. Lipid panel was normal except for a mildly deviated triglycerides. Hemoglobin A1c was deviated to 7.5. Patient's right-sided numbness has not resolved except for a persistent right face and numbness. He does not have any residual weakness and can be discharged home on baby aspirin.  Hypertension Blood pressure is intubated and the line for permissive hypertension. I will hold his blood pressure medications upon discharge which he can resume his next 3 days.  Diabetes  mellitus Patient on glipizide and Janumet. A1c 7.5. I will double the dose of his Janumet upon discharge.     Procedures:  None  Consultations:  Neurology  Discharge Exam: Filed Vitals:   07/14/12 1058 07/14/12 1349 07/14/12 2100 07/15/12 0550  BP: 172/92 179/83 173/85 177/92  Pulse:  106 60 63  Temp: 97.8 F (36.6 C) 98.4 F (36.9 C) 98.1 F (36.7 C) 98.3 F (36.8 C)  TempSrc:  Oral Oral Oral  Resp: 18 18 16 12   Height: 5\' 5"  (1.651 m)     Weight: 94.6 kg (208 lb 8.9 oz)     SpO2: 100% 100% 99% 98%    General: Middle aged male in no acute distress HEENT: No pallor, moist oral mucosa Chest: Clear to auscultation bilaterally, no added sounds CVS: Normal S1 and S2, no murmurs rub or gallop Abdomen: Soft, nontender, nondistended, bowel sounds present Extremities: Warm, no edema CNS: AAO x3,  no focal deficit   Discharge Instructions   Future Appointments Provider Department Dept Phone   08/11/2012 9:05 AM Margaree Mackintosh, MD Sharlet Salina, MD [phone]   08/13/2012 4:30 PM Margaree Mackintosh, MD Sharlet Salina, MD [phone]       Medication List    TAKE these medications       amLODipine 5 MG tablet  Commonly known as:  NORVASC  TAKE 1 TABLET DAILY ( resume from 6/21)     aspirin 81 MG EC tablet  Take 1 tablet (81 mg total) by mouth daily.     atenolol 50 MG tablet  Commonly known as:  TENORMIN  Take 1 tablet (  50 mg total) by mouth daily.( resume from 6/21)     cetirizine 10 MG tablet  Commonly known as:  ZYRTEC  Take 10 mg by mouth daily as needed.     freestyle lancets     FREESTYLE LITE test strip  Generic drug:  glucose blood     glipiZIDE 10 MG 24 hr tablet  Commonly known as:  GLUCOTROL XL  Take 1 tablet (10 mg total) by mouth daily.     JANUMET 50-500 MG per tablet  Generic drug:  sitaGLIPtan-metformin  Take 2 tablets by mouth 2 (two) times daily with a meal.     lisinopril-hydrochlorothiazide 20-25 MG per tablet  Commonly known  as:  PRINZIDE,ZESTORETIC  TAKE ONE TABLET BY MOUTH EVERY DAY ( resume from 6/21)       No Known Allergies     Follow-up Information   Follow up with Margaree Mackintosh, MD In 1 week.   Contact information:   [address] [place] [address] [phone]       Follow up with [facility]. Schedule an appointment as soon as possible for a visit in 2 months.   Contact information:   [address] [place] [address] [phone]       The results of significant diagnostics from this hospitalization (including imaging, microbiology, ancillary and laboratory) are listed below for reference.    Significant Diagnostic Studies: Ct Head Wo Contrast  07/14/2012   *RADIOLOGY REPORT*  Clinical Data: Right-sided numbness.  CT HEAD WITHOUT CONTRAST  Technique:  Contiguous axial images were obtained from the base of the skull through the vertex without contrast.  Comparison: None.  Findings: The ventricles are normal.  No extra-axial fluid collections are seen.  The brainstem and cerebellum are unremarkable.  No acute intracranial findings such as infarction or hemorrhage.  No mass lesions.  The bony calvarium is intact.  The visualized paranasal sinuses and mastoid air cells are clear except for a small left mastoid effusion inferiorly.  IMPRESSION: No acute intracranial findings or mass lesions.   Original Report Authenticated By: Rudie Meyer, M.D.   [name] Head Wo Contrast  07/14/2012   *RADIOLOGY REPORT*  Clinical Data:  Stroke and diabetes  MRI HEAD WITHOUT CONTRAST MRA HEAD WITHOUT CONTRAST  Technique:  Multiplanar, multiecho pulse sequences of the brain and surrounding structures were obtained without intravenous contrast. Angiographic images of the head were obtained using MRA technique without contrast.  Comparison:  CT 07/14/2012  MRI HEAD  Findings:  Acute infarct left lateral thalamus measuring 1 cm.  No other acute infarct.  Small hyperintensities  in the frontal white matter and parietal white matter bilaterally consistent with chronic microvascular ischemia.  No cortical infarct.  Negative for hemorrhage or mass lesion.  Ventricle size is normal and there is no shift to the midline structures.  Mild left mastoid sinus effusion.  Negative for nasal pharyngeal mass.  IMPRESSION: Acute infarct left thalamus.  Mild chronic microvascular ischemic change in the white matter.  MRA HEAD  Findings: Both vertebral arteries are patent to the basilar.  PICA patent bilaterally.  Basilar is widely patent.  The superior cerebellar arteries are patent bilaterally.  Posterior cerebral arteries are patent bilaterally.  There is irregularity and mild to moderate disease in the left posterior cerebral artery.  Right PCA is patent.  Right cavernous carotid is patent with mild stenosis.  Right A1 segment is hypoplastic without significant contribution to the anterior cerebral artery.  Right  middle cerebral artery widely patent.  Left cavernous carotid is widely patent.  Left anterior and middle cerebral arteries widely patent.  Both anterior cerebral arteries are supplied from the left A1 segment.  Negative for cerebral aneurysm.  IMPRESSION: Mild to moderate disease in the left posterior cerebral artery.  No large vessel occlusion.   Original Report Authenticated By: Janeece Riggers, M.D.   Mri Brain Without Contrast  07/14/2012   *RADIOLOGY REPORT*  Clinical Data:  Stroke and diabetes  MRI HEAD WITHOUT CONTRAST MRA HEAD WITHOUT CONTRAST  Technique:  Multiplanar, multiecho pulse sequences of the brain and surrounding structures were obtained without intravenous contrast. Angiographic images of the head were obtained using MRA technique without contrast.  Comparison:  CT 07/14/2012  MRI HEAD  Findings:  Acute infarct left lateral thalamus measuring 1 cm.  No other acute infarct.  Small hyperintensities in the frontal white matter and parietal white matter bilaterally consistent  with chronic microvascular ischemia.  No cortical infarct.  Negative for hemorrhage or mass lesion.  Ventricle size is normal and there is no shift to the midline structures.  Mild left mastoid sinus effusion.  Negative for nasal pharyngeal mass.  IMPRESSION: Acute infarct left thalamus.  Mild chronic microvascular ischemic change in the white matter.  MRA HEAD  Findings: Both vertebral arteries are patent to the basilar.  PICA patent bilaterally.  Basilar is widely patent.  The superior cerebellar arteries are patent bilaterally.  Posterior cerebral arteries are patent bilaterally.  There is irregularity and mild to moderate disease in the left posterior cerebral artery.  Right PCA is patent.  Right cavernous carotid is patent with mild stenosis.  Right A1 segment is hypoplastic without significant contribution to the anterior cerebral artery.  Right middle cerebral artery widely patent.  Left cavernous carotid is widely patent.  Left anterior and middle cerebral arteries widely patent.  Both anterior cerebral arteries are supplied from the left A1 segment.  Negative for cerebral aneurysm.  IMPRESSION: Mild to moderate disease in the left posterior cerebral artery.  No large vessel occlusion.   Original Report Authenticated By: Janeece Riggers, M.D.    Microbiology: No results found for this or any previous visit (from the past 240 hour(s)).   Labs: Basic Metabolic Panel:  Recent Labs Lab 07/14/12 0725 07/15/12 0525  NA 135 137  K 3.9 3.7  CL 100 99  CO2 22 30  GLUCOSE 256* 188*  BUN 19 16  CREATININE 0.91 1.11  CALCIUM 9.7 10.3  MG 2.0  --    Liver Function Tests: No results found for this basename: AST, ALT, ALKPHOS, BILITOT, PROT, ALBUMIN,  in the last 168 hours No results found for this basename: LIPASE, AMYLASE,  in the last 168 hours No results found for this basename: AMMONIA,  in the last 168 hours CBC:  Recent Labs Lab 07/14/12 0725 07/15/12 0525  WBC 7.1 7.9  NEUTROABS 5.0   --   HGB 15.8 15.7  HCT 46.9 48.2  MCV 79.0 78.8  PLT 201 220   Cardiac Enzymes: No results found for this basename: CKTOTAL, CKMB, CKMBINDEX, TROPONINI,  in the last 168 hours BNP: BNP (last 3 results) No results found for this basename: PROBNP,  in the last 8760 hours CBG:  Recent Labs Lab 07/14/12 1209 07/14/12 1640 07/14/12 2114 07/15/12 0726  GLUCAP 125* 197* 172* 172*       Signed:  Heavin Sebree  [facility] 07/15/2012, 10:38 AM

## 2012-07-23 ENCOUNTER — Other Ambulatory Visit: Payer: PRIVATE HEALTH INSURANCE | Admitting: Internal Medicine

## 2012-07-23 DIAGNOSIS — Z79899 Other long term (current) drug therapy: Secondary | ICD-10-CM

## 2012-07-23 DIAGNOSIS — E785 Hyperlipidemia, unspecified: Secondary | ICD-10-CM

## 2012-07-23 LAB — LIPID PANEL
Cholesterol: 148 mg/dL (ref 0–200)
Total CHOL/HDL Ratio: 3.9 Ratio
Triglycerides: 101 mg/dL (ref <150)
VLDL: 20 mg/dL (ref 0–40)

## 2012-07-23 LAB — HEPATIC FUNCTION PANEL
ALT: 36 U/L (ref 0–53)
AST: 21 U/L (ref 0–37)
Indirect Bilirubin: 0.6 mg/dL (ref 0.0–0.9)
Total Protein: 6.7 g/dL (ref 6.0–8.3)

## 2012-07-24 ENCOUNTER — Ambulatory Visit (INDEPENDENT_AMBULATORY_CARE_PROVIDER_SITE_OTHER): Payer: PRIVATE HEALTH INSURANCE | Admitting: Internal Medicine

## 2012-07-24 VITALS — BP 150/84 | HR 76 | Temp 97.2°F | Wt 209.0 lb

## 2012-07-24 DIAGNOSIS — I6350 Cerebral infarction due to unspecified occlusion or stenosis of unspecified cerebral artery: Secondary | ICD-10-CM

## 2012-07-24 DIAGNOSIS — I639 Cerebral infarction, unspecified: Secondary | ICD-10-CM

## 2012-07-24 DIAGNOSIS — I10 Essential (primary) hypertension: Secondary | ICD-10-CM

## 2012-07-24 DIAGNOSIS — E119 Type 2 diabetes mellitus without complications: Secondary | ICD-10-CM

## 2012-07-25 LAB — MICROALBUMIN, URINE: Microalb, Ur: 6.22 mg/dL — ABNORMAL HIGH (ref 0.00–1.89)

## 2012-07-26 ENCOUNTER — Encounter: Payer: Self-pay | Admitting: Internal Medicine

## 2012-07-26 NOTE — Patient Instructions (Addendum)
Return in 4 weeks for hemoglobin A1c, blood pressure check and office visit

## 2012-07-26 NOTE — Progress Notes (Signed)
  Subjective:    Patient ID: Mike Parker, male    DOB: 02-27-1950, 62 y.o.   MRN: [id number]  HPI He is in today for six-month recheck on diabetes and hypertension. His blood pressure is elevated today. Says he has "not been doing right". Suspect he's not been following a diet. He works as a Retail banker. Long-standing history of hypertension. Is on Glucotrol and Janumet for diabetes. Is on Zestoretic 20/25 and amlodipine for hypertension. Hemoglobin A1c in December 2013 was 7.2%. Fasting glucose was 133. Lipid panel was normal. Says he is a bit stressed because he doesn't really one of the results of his lab work. We have decided to defer lab work today and give him a month to get on a better track. He's been having some issues with shoulder pain.    Review of Systems     Objective:   Physical Exam neck is supple without JVD thyromegaly or carotid bruits. Chest clear to auscultation. Cardiac exam regular rate and rhythm normal S1 and S2.        Assessment & Plan:  Hypertension-blood pressure is elevated today. Not sure if he has run out of some this medication her has not been taking it correctly. Return in 4 weeks for repeat blood pressure check.  Controlled type 2 diabetes mellitus with medication-defer hemoglobin A1c for 4 weeks.  Plan: Return in 4 weeks for hemoglobin A1c and blood pressure check and office visit.

## 2012-07-27 ENCOUNTER — Encounter: Payer: Self-pay | Admitting: Internal Medicine

## 2012-07-27 NOTE — Patient Instructions (Addendum)
Return next week for blood pressure check on new regimen

## 2012-07-27 NOTE — Progress Notes (Addendum)
  Subjective:    Patient ID: Mike Parker, male    DOB: 1951-01-04, 62 y.o.   MRN: [id number]  HPI Recently admitted on Father's Day with a left thalamic ischemic stroke with apparent disease in left posterior cerebral artery. Patient is experiencing slowly resolving paresthesias right face right arm right leg. He tried to return to work this week but found it very fatiguing.  Patient says blood pressure is remaining high. Says he's been on a strict diet since being discharged from hospital. Says his daughters and his wife are watching him carefully. Is having issues with insomnia. Unable to sleep well. I think he is worried about his health. We need to get him an appointment to followup with stroke specialist. We need to make sure his diabetes is under strict control as well as his blood pressure. Now, he needs to be a lipid-lowering medication although his lipids have always been normal.    Review of Systems     Objective:   Physical Exam skin is warm and dry. No decreased muscle strength in right upper and lower extremities. No facial weakness. Alert and oriented x3. Affect is slightly depressed. Chest clear to auscultation. Cardiac exam regular rate and rhythm.        Assessment & Plan:  Insomnia-try Ativan 1 mg at bedtime  Left thalamic stroke-currently on aspirin daily. Also needs to start statin medication in view of diabetes and stroke.  Hypertension-needs better control. Return in one week for blood pressure check on the regimen  Type 2 diabetes mellitus  controlled with medication-needs better control  Plan: New regimen of blood pressure control as follows:  Lasix 40 mg daily. Amlodipine 5 mg daily. Bystolic 5 mg daily. Cozaar 100 mg daily. Discontinue Tenormin. Discontinue Zestoretic. Patient will monitor blood pressure at home and call me if any problems develop before next visit. Will need b- met at next visit.  Time spent with patient 25 minutes  FMLA form completed.  Patient will be out of work for 6 weeks. He will see dietitian regarding diabetic diet.

## 2012-07-30 ENCOUNTER — Encounter: Payer: Self-pay | Admitting: Internal Medicine

## 2012-07-30 ENCOUNTER — Ambulatory Visit (INDEPENDENT_AMBULATORY_CARE_PROVIDER_SITE_OTHER): Payer: PRIVATE HEALTH INSURANCE | Admitting: Internal Medicine

## 2012-07-30 VITALS — BP 118/76 | HR 76 | Temp 97.7°F | Wt 209.0 lb

## 2012-07-30 DIAGNOSIS — I63212 Cerebral infarction due to unspecified occlusion or stenosis of left vertebral arteries: Secondary | ICD-10-CM

## 2012-07-30 DIAGNOSIS — I10 Essential (primary) hypertension: Secondary | ICD-10-CM

## 2012-07-30 DIAGNOSIS — E119 Type 2 diabetes mellitus without complications: Secondary | ICD-10-CM

## 2012-07-30 DIAGNOSIS — I6350 Cerebral infarction due to unspecified occlusion or stenosis of unspecified cerebral artery: Secondary | ICD-10-CM

## 2012-07-31 LAB — BASIC METABOLIC PANEL
CO2: 27 mEq/L (ref 19–32)
Calcium: 9.5 mg/dL (ref 8.4–10.5)
Creat: 1.22 mg/dL (ref 0.50–1.35)
Glucose, Bld: 93 mg/dL (ref 70–99)
Sodium: 140 mEq/L (ref 135–145)

## 2012-08-11 ENCOUNTER — Other Ambulatory Visit: Payer: PRIVATE HEALTH INSURANCE | Admitting: Internal Medicine

## 2012-08-13 ENCOUNTER — Ambulatory Visit: Payer: PRIVATE HEALTH INSURANCE | Admitting: Internal Medicine

## 2012-08-18 ENCOUNTER — Other Ambulatory Visit: Payer: Self-pay | Admitting: Internal Medicine

## 2012-08-31 ENCOUNTER — Other Ambulatory Visit: Payer: PRIVATE HEALTH INSURANCE | Admitting: Internal Medicine

## 2012-08-31 DIAGNOSIS — E119 Type 2 diabetes mellitus without complications: Secondary | ICD-10-CM

## 2012-08-31 DIAGNOSIS — E785 Hyperlipidemia, unspecified: Secondary | ICD-10-CM

## 2012-08-31 DIAGNOSIS — Z79899 Other long term (current) drug therapy: Secondary | ICD-10-CM

## 2012-08-31 LAB — LIPID PANEL
Cholesterol: 84 mg/dL (ref 0–200)
Total CHOL/HDL Ratio: 2.2 Ratio

## 2012-08-31 LAB — HEPATIC FUNCTION PANEL
ALT: 24 U/L (ref 0–53)
AST: 14 U/L (ref 0–37)
Alkaline Phosphatase: 53 U/L (ref 39–117)
Bilirubin, Direct: 0.2 mg/dL (ref 0.0–0.3)
Total Bilirubin: 0.7 mg/dL (ref 0.3–1.2)

## 2012-08-31 LAB — HEMOGLOBIN A1C: Hgb A1c MFr Bld: 7.1 % — ABNORMAL HIGH (ref <5.7)

## 2012-09-01 ENCOUNTER — Encounter: Payer: Self-pay | Admitting: Internal Medicine

## 2012-09-01 ENCOUNTER — Ambulatory Visit (INDEPENDENT_AMBULATORY_CARE_PROVIDER_SITE_OTHER): Payer: PRIVATE HEALTH INSURANCE | Admitting: Internal Medicine

## 2012-09-01 VITALS — BP 128/84 | HR 74 | Temp 97.0°F | Wt 201.0 lb

## 2012-09-01 DIAGNOSIS — I6350 Cerebral infarction due to unspecified occlusion or stenosis of unspecified cerebral artery: Secondary | ICD-10-CM

## 2012-09-01 DIAGNOSIS — E119 Type 2 diabetes mellitus without complications: Secondary | ICD-10-CM

## 2012-09-01 DIAGNOSIS — I63212 Cerebral infarction due to unspecified occlusion or stenosis of left vertebral arteries: Secondary | ICD-10-CM

## 2012-09-01 DIAGNOSIS — E669 Obesity, unspecified: Secondary | ICD-10-CM

## 2012-09-01 DIAGNOSIS — I10 Essential (primary) hypertension: Secondary | ICD-10-CM

## 2012-09-04 ENCOUNTER — Other Ambulatory Visit: Payer: PRIVATE HEALTH INSURANCE | Admitting: Internal Medicine

## 2012-09-14 ENCOUNTER — Other Ambulatory Visit: Payer: Self-pay

## 2012-09-14 MED ORDER — FUROSEMIDE 40 MG PO TABS: 40.0000 mg | ORAL_TABLET | Freq: Every day | ORAL | Status: DC

## 2012-09-14 MED ORDER — LOSARTAN POTASSIUM 100 MG PO TABS: 100.0000 mg | ORAL_TABLET | Freq: Every day | ORAL | Status: DC

## 2012-09-27 NOTE — Patient Instructions (Addendum)
Continue same medications. Return to work part time.

## 2012-09-27 NOTE — Progress Notes (Signed)
  Subjective:    Patient ID: Mike Parker, male    DOB: 08/25/1950, 62 y.o.   MRN: [id number]  HPI Was taken out of work for 6 weeks on June 27 after having suffered a left thalamic stroke affecting his right face right leg and right arm on Father's Day June 2014 and was discharged from hospital June 18.  He tried to return to work full-time but found it very fatiguing. He's been out of work since that time and is now ready to return to work part time. His blood pressure is under excellent control. He is on statin medication. Says his diabetes is doing much better.    Review of Systems     Objective:   Physical Exam chest clear to auscultation. Cardiac exam regular rate and rhythm normal S1 and S2. Extremities without edema. Still has paresthesias right arm and right leg which have not resolved. He is alert and oriented x3        Assessment & Plan:  History of left thalamic stroke June 2014. Patient now on statin therapy. Lipid panel liver functions are within normal limits.  Hypertension-stable on current regimen.  Obesity-needs to diet and exercise  Adult onset diabetes mellitus-well controlled-hemoglobin A1c improved from 7.5% in June to 7.1%  Plan: Form completed for him to return to work part-time.  Time spent with patient including completing returned to work form-25 minutes

## 2012-09-29 ENCOUNTER — Other Ambulatory Visit: Payer: Self-pay

## 2012-09-29 MED ORDER — NEBIVOLOL HCL 5 MG PO TABS: 5.0000 mg | ORAL_TABLET | Freq: Every day | ORAL | Status: DC

## 2012-11-23 ENCOUNTER — Ambulatory Visit (INDEPENDENT_AMBULATORY_CARE_PROVIDER_SITE_OTHER): Payer: PRIVATE HEALTH INSURANCE | Admitting: Neurology

## 2012-11-23 ENCOUNTER — Encounter: Payer: Self-pay | Admitting: Neurology

## 2012-11-23 VITALS — BP 178/91 | HR 75 | Temp 98.0°F | Ht 66.0 in | Wt 212.0 lb

## 2012-11-23 DIAGNOSIS — I6381 Other cerebral infarction due to occlusion or stenosis of small artery: Secondary | ICD-10-CM

## 2012-11-23 DIAGNOSIS — I6350 Cerebral infarction due to unspecified occlusion or stenosis of unspecified cerebral artery: Secondary | ICD-10-CM

## 2012-11-23 NOTE — Patient Instructions (Signed)
Continue aspirin for stroke prevention and strict control of diabetes with hemoglobin A1c goal below 6.5%, hypertension with blood pressure goal below 120/80 and lipids with LDL cholesterol goal below 70 mg percent. I encouraged him to exercise regularly, watch his diet and lose weight. I suggested getting a sleep study for sleep apnoea but patient is refusing it at the present time. Return for followup in 2 months with Heide Guile, NP. call earlier if necessary.

## 2012-11-23 NOTE — Progress Notes (Signed)
[facility] [address] [place]. [place] [address] [phone]       OFFICE CONSULT NOTE  [name] Date of Birth:  09-Jan-1951 Medical Record Number:  [id number]   Referring MD:  Elizbeth Squires  Reason for Referral:  Stroke  HPI: Mr Mike Parker is a 62 year male who presented on 07/14/12  With 2 day history of right face, arm and leg tingling and numbness. He denied any focal weakness, dizziness, gait disturbance, headache, vertigo or vision problems. CT scan of the head on admission was unremarkable. MRI scan of the brain showed a small 1 cm left thalamic acute infarct. Transthoracic echo and carotid ultrasound were normal. Hemoglobin A1c was elevated at 7.5. Lipid profile was normal. MRA of the brain showed no large vessel occlusion. There is mild acoustical changes of the left posterior cerebral artery. He was started on aspirin for stroke prevention and advise aggressive control of hypertension and diabetes. He states it done well since discharge but still has some intermittent he denies any new focal neurological symptoms. She states her blood pressure at home is in the 130s but today it is elevated at 178/91 in office. He had followup hemoglobin A1c checked which was down to 7.1 now. He does snore a little but denies excessive daytime sleepiness tiredness and does not want to be evaluated with a sleep study for sleep apnea at the present time. There is a family history of stroke in a sister. He is currently not on any special diet or exercising regularly to lose weight.  ROS:   14 system review of systems is positive for numbness and tingling only PMH:  Past Medical History  Diagnosis Date  . Hypertension   . Allergy   . Diabetes mellitus   . Insomnia     Social History:  History   Social History  . Marital Status: Married    Spouse Name: N/A    Number of Children: 2  . Years of Education: BS   Occupational History  . [employer]    Social History Main  Topics  . Smoking status: Never Smoker   . Smokeless tobacco: Never Used  . Alcohol Use: No  . Drug Use: No  . Sexual Activity: Yes   Other Topics Concern  . Not on file   Social History Narrative  . No narrative on file    Medications:   Current Outpatient Prescriptions on File Prior to Visit  Medication Sig Dispense Refill  . amLODipine (NORVASC) 5 MG tablet TAKE 1 TABLET DAILY  90 tablet  3  . aspirin EC 81 MG EC tablet Take 1 tablet (81 mg total) by mouth daily.  30 tablet  3  . atorvastatin (LIPITOR) 10 MG tablet       . cetirizine (ZYRTEC) 10 MG tablet Take 10 mg by mouth daily as needed.        Marland Kitchen FREESTYLE LITE test strip       . furosemide (LASIX) 40 MG tablet Take 1 tablet (40 mg total) by mouth daily.  90 tablet  3  . glipiZIDE (GLUCOTROL XL) 10 MG 24 hr tablet Take 1 tablet (10 mg total) by mouth daily.  90 tablet  3  . JANUMET 50-500 MG per tablet Take 2 tablets by mouth 2 (two) times daily with a meal.  180 tablet  2  . Lancets (FREESTYLE) lancets       . LORazepam (ATIVAN) 1 MG tablet       .  losartan (COZAAR) 100 MG tablet Take 1 tablet (100 mg total) by mouth daily.  90 tablet  3  . nebivolol (BYSTOLIC) 5 MG tablet Take 1 tablet (5 mg total) by mouth daily.  90 tablet  4   No current facility-administered medications on file prior to visit.    Allergies:  No Known Allergies  Physical Exam General: Mildly obese middle-age Caucasian male, seated, in no evident distress Head: head normocephalic and atraumatic. Orohparynx benign Neck: supple with no carotid or supraclavicular bruits Cardiovascular: regular rate and rhythm, no murmurs Musculoskeletal: no deformity Skin:  no rash/petichiae Vascular:  Normal pulses all extremities Filed Vitals:   11/23/12 1415  BP: 178/91  Pulse: 75  Temp: 98 F (36.7 C)    Neurologic Exam Mental Status: Awake and fully alert. Oriented to place and time. Recent and remote memory intact. Attention span, concentration and  fund of knowledge appropriate. Mood and affect appropriate.  Cranial Nerves: Fundoscopic exam reveals sharp disc margins. Pupils equal, briskly reactive to light. Extraocular movements full without nystagmus. Visual fields full to confrontation. Hearing intact. Facial sensation intact. Face, tongue, palate moves normally and symmetrically.  Motor: Normal bulk and tone. Normal strength in all tested extremity muscles. Sensory.: intact to touch and pinprick and vibratory sensation but mild subjective paresthesias in the right arm..  Coordination: Rapid alternating movements normal in all extremities. Finger-to-nose and heel-to-shin performed accurately bilaterally. Gait and Station: Arises from chair without difficulty. Stance is normal. Gait demonstrates normal stride length and balance . Able to heel, toe and tandem walk without difficulty.  Reflexes: 1+ and symmetric. Toes downgoing.   NIHSS  0 Modified Rankin  1   ASSESSMENT: 62 year male with left thalamic infarct in June 2014 secondary to small vessel disease with vascular risk factors of diabetes, hypertension and mild obesity.    PLAN: I had a long discussion the patient with regards to his stroke, mechanism, treatment options, risks factors and reduction and answered questions Continue aspirin for stroke prevention and strict control of diabetes with hemoglobin A1c goal below 6.5%, hypertension with blood pressure goal below 120/80 and lipids with LDL cholesterol goal below 70 mg percent. I encouraged him to exercise regularly, watch his diet and lose weight. I suggested getting a sleep study for sleep apnoea but patient is refusing it at the present time. Return for followup in 2 months with Heide Guile, NP. call earlier if necessary.

## 2012-12-03 ENCOUNTER — Other Ambulatory Visit: Payer: Self-pay

## 2013-01-04 ENCOUNTER — Other Ambulatory Visit: Payer: PRIVATE HEALTH INSURANCE | Admitting: Internal Medicine

## 2013-01-04 DIAGNOSIS — I10 Essential (primary) hypertension: Secondary | ICD-10-CM

## 2013-01-04 DIAGNOSIS — Z79899 Other long term (current) drug therapy: Secondary | ICD-10-CM

## 2013-01-04 DIAGNOSIS — E119 Type 2 diabetes mellitus without complications: Secondary | ICD-10-CM

## 2013-01-04 LAB — HEPATIC FUNCTION PANEL
Albumin: 4.4 g/dL (ref 3.5–5.2)
Total Bilirubin: 1 mg/dL (ref 0.3–1.2)
Total Protein: 6.9 g/dL (ref 6.0–8.3)

## 2013-01-04 LAB — LIPID PANEL
HDL: 36 mg/dL — ABNORMAL LOW (ref >39)
LDL Cholesterol: 59 mg/dL (ref 0–99)
Total CHOL/HDL Ratio: 3.1 Ratio
Triglycerides: 78 mg/dL (ref <150)
VLDL: 16 mg/dL (ref 0–40)

## 2013-01-04 LAB — HEMOGLOBIN A1C
Hgb A1c MFr Bld: 6.9 % — ABNORMAL HIGH (ref <5.7)
Mean Plasma Glucose: 151 mg/dL — ABNORMAL HIGH (ref <117)

## 2013-01-05 ENCOUNTER — Encounter: Payer: Self-pay | Admitting: Internal Medicine

## 2013-01-05 ENCOUNTER — Ambulatory Visit (INDEPENDENT_AMBULATORY_CARE_PROVIDER_SITE_OTHER): Payer: PRIVATE HEALTH INSURANCE | Admitting: Internal Medicine

## 2013-01-05 VITALS — BP 176/98 | HR 64 | Temp 97.5°F | Ht 65.25 in | Wt 205.0 lb

## 2013-01-05 DIAGNOSIS — I10 Essential (primary) hypertension: Secondary | ICD-10-CM

## 2013-01-05 DIAGNOSIS — I63212 Cerebral infarction due to unspecified occlusion or stenosis of left vertebral arteries: Secondary | ICD-10-CM

## 2013-01-05 DIAGNOSIS — Z23 Encounter for immunization: Secondary | ICD-10-CM

## 2013-01-05 DIAGNOSIS — E119 Type 2 diabetes mellitus without complications: Secondary | ICD-10-CM

## 2013-01-05 DIAGNOSIS — I6350 Cerebral infarction due to unspecified occlusion or stenosis of unspecified cerebral artery: Secondary | ICD-10-CM

## 2013-01-05 MED ORDER — NEBIVOLOL HCL 10 MG PO TABS: 10.0000 mg | ORAL_TABLET | Freq: Every day | ORAL | Status: DC

## 2013-01-05 MED ORDER — FLUTICASONE PROPIONATE 50 MCG/ACT NA SUSP: 2.0000 | Freq: Every day | NASAL | Status: DC

## 2013-01-14 ENCOUNTER — Encounter: Payer: Self-pay | Admitting: Internal Medicine

## 2013-01-14 ENCOUNTER — Ambulatory Visit (INDEPENDENT_AMBULATORY_CARE_PROVIDER_SITE_OTHER): Payer: PRIVATE HEALTH INSURANCE | Admitting: Internal Medicine

## 2013-01-14 VITALS — BP 140/82 | HR 72 | Temp 98.1°F | Ht 65.25 in | Wt 205.0 lb

## 2013-01-14 DIAGNOSIS — I10 Essential (primary) hypertension: Secondary | ICD-10-CM

## 2013-01-14 NOTE — Patient Instructions (Signed)
Take medications earlier in the day and returned just after Christmas for followup.

## 2013-01-14 NOTE — Progress Notes (Addendum)
   Subjective:    Patient ID: Mike Parker, male    DOB: 1950-04-17, 62 y.o.   MRN: [id number]  HPI Pt here today for followup of hypertension. At last visit, had increased diastolic from 5-10 mg daily. Generally he's not been taking that until noon and it is now noon and he hasn't had that medication. He takes amlodipine around 10 AM and losartan and Lasix at 8 AM. I want him to start taking amlodipine, Lasix, losartan at 8 AM and diastolic at 10 AM. He had a cortisone injection yesterday by Dr. Teressa Senter for frozen shoulder and will be see receiving physical therapy.    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       Assessment & Plan:  Plan: Patient is to take blood pressure medications earlier in the day and return after Christmas:  Hypertension  History of stroke  Left frozen shoulder-see above

## 2013-01-24 ENCOUNTER — Encounter: Payer: Self-pay | Admitting: Internal Medicine

## 2013-01-24 NOTE — Progress Notes (Signed)
   Subjective:    Patient ID: Mike Parker, male    DOB: 09/06/1950, 62 y.o.   MRN: [id number]  HPI For followup of hypertension, diabetes mellitus and left thalamic stroke which occurred mid June. Unfortunately his blood pressure is elevated today. I think he did not take his medication until just coming to the office. 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 Currently working full-time. Says blood pressure has generally been running pretty well. He is surprised that is high today. Had been very good at last office visit. We had changed his regimen around.    Review of Systems     Objective:   Physical Exam  Chest clear to auscultation, Cardiac exam: regular rate and rhythm normal S1 and S2, extremities without edema        Assessment & Plan:  Hypertension  Diabetes mellitus  Left thalamic stroke-June 2014  Plan: Increase Bystolic to 10 mg daily and return in 2 weeks. Continue other blood pressure medications as previously prescribed.

## 2013-01-24 NOTE — Patient Instructions (Signed)
Continue current blood pressure regimen. Return in 4 weeks. Remain out of work.

## 2013-01-24 NOTE — Patient Instructions (Signed)
Increase Bystolic to 10 mg daily and return in 2 weeks

## 2013-01-24 NOTE — Progress Notes (Signed)
   Subjective:    Patient ID: BROEDY OSBOURNE, male    DOB: Mar 04, 1950, 62 y.o.   MRN: [id number]  HPI Patient hospitalized on June 17th with an acute left thalamic ischemic stroke. At last visit here June 27 blood pressure regimen was altered. He is now on Lasix 40 mg daily, amlodipine 5 mg daily, diastolic 5 mg daily, Cozaar 100 mg daily. Tenormin was discontinued. He is pleased that his blood pressure has improved. He remains out of work. Patient still fatigued.    Review of Systems     Objective:   Physical Exam Neck is supple without JVD thyromegaly or carotid bruits. Chest clear to auscultation. Cardiac exam regular rate and rhythm. Extremities without edema.       Assessment & Plan:  Hypertension-blood pressure control much better on the regimen basic metabolic panel drawn  Left thalamic ischemic stroke  History of diabetes mellitus  Plan: Patient to remain out of work and return in 4 weeks.  25 minutes spent with patient

## 2013-01-29 ENCOUNTER — Encounter: Payer: Self-pay | Admitting: Internal Medicine

## 2013-01-29 ENCOUNTER — Ambulatory Visit (INDEPENDENT_AMBULATORY_CARE_PROVIDER_SITE_OTHER): Payer: PRIVATE HEALTH INSURANCE | Admitting: Internal Medicine

## 2013-01-29 VITALS — BP 150/82 | Temp 97.7°F | Wt 205.0 lb

## 2013-01-29 DIAGNOSIS — I10 Essential (primary) hypertension: Secondary | ICD-10-CM

## 2013-01-29 DIAGNOSIS — M7502 Adhesive capsulitis of left shoulder: Secondary | ICD-10-CM

## 2013-01-29 DIAGNOSIS — M7500 Adhesive capsulitis of unspecified shoulder: Secondary | ICD-10-CM

## 2013-01-29 DIAGNOSIS — E119 Type 2 diabetes mellitus without complications: Secondary | ICD-10-CM

## 2013-01-29 DIAGNOSIS — Z8673 Personal history of transient ischemic attack (TIA), and cerebral infarction without residual deficits: Secondary | ICD-10-CM

## 2013-01-29 NOTE — Patient Instructions (Signed)
Appointment made for you to see cardiologist Monday, January 5 regarding blood pressure control. Out of work until seen by cardiologist.

## 2013-01-29 NOTE — Progress Notes (Signed)
   Subjective:    Patient ID: Mike Parker, male    DOB: [birth year], 63 y.o.   MRN: [id number]  HPI 63 year old male with long-standing history of hypertension and type 2 diabetes mellitus. He had a left thalamic stroke June 2014. This made him quite anxious and concerned. His blood pressures been elevated since that time surrounding the stroke. We have worked with several different blood pressure medications trying to control his blood pressure. His wife is out of town and he got anxious last evening when his blood pressure was elevated at 150 systolically. We have checked it twice today and is 150 systolically each time. He is on Bystolic, Lasix, losartan, and amlodipine. He was so anxious about his blood pressure that he did not go to work today. 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    Review of Systems     Objective:   Physical Exam neck is supple without JVD thyromegaly or carotid bruits. Chest clear to auscultation. Cardiac exam regular rate and rhythm. Extremities without edema.        Assessment & Plan:  Difficult to control hypertension  Plan: Appointment with cardiologist for evaluation 02/26/2013. Out of work until seen by cardiologist.

## 2013-02-01 ENCOUNTER — Encounter: Payer: Self-pay | Admitting: Cardiology

## 2013-02-01 ENCOUNTER — Ambulatory Visit (INDEPENDENT_AMBULATORY_CARE_PROVIDER_SITE_OTHER): Payer: 59 | Admitting: Cardiology

## 2013-02-01 VITALS — BP 142/99 | HR 83 | Ht 65.25 in | Wt 206.0 lb

## 2013-02-01 DIAGNOSIS — I10 Essential (primary) hypertension: Secondary | ICD-10-CM

## 2013-02-01 MED ORDER — AMLODIPINE BESYLATE 10 MG PO TABS: 10.0000 mg | ORAL_TABLET | Freq: Every day | ORAL | Status: DC

## 2013-02-01 NOTE — Progress Notes (Signed)
Patient ID: Mike LeitzDon K Ludwig, male   DOB: [birth year], 63 y.o.   MRN: [id number]    Patient Name: Mike Parker Date of Encounter: 02/01/2013  Primary Care Provider:  Margaree MackintoshBAXLEY,MARY J, MD Primary Cardiologist:  Tobias AlexanderNELSON, Kenshin Splawn, H  Problem List   Past Medical History  Diagnosis Date  . Hypertension   . Allergy   . Diabetes mellitus   . Insomnia    Past Surgical History  Procedure Laterality Date  . Hernia repair      left inguinal  . Knee surgery      rt and left  . Elbow surgery      rt elbow    Allergies  No Known Allergies  HPI  HPI 63 year old male with long-standing history of hypertension and type 2 diabetes mellitus. He had a left thalamic stroke June 2014 with almost complete recovery. His blood pressures been elevated since that time surrounding the stroke.  He is on multiple BP medication regimen, recently his Atenolol was changed to Bystolic (nebivolol), lasix and amlodipine were added. He continues having elevated P. He has been feeling fatigued since the last Friday and he attributes it to his hypertension. He denies chest pain or shortness of breath. 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 No palpitations, orthopnea or PND.  Home Medications  Prior to Admission medications   Medication Sig Start Date End Date Taking? Authorizing Provider  amLODipine (NORVASC) 5 MG tablet TAKE 1 TABLET DAILY 07/11/12  Yes Margaree MackintoshMary J Baxley, MD  aspirin EC 81 MG EC tablet Take 1 tablet (81 mg total) by mouth daily. 07/15/12  Yes Nishant Dhungel, MD  atorvastatin (LIPITOR) 10 MG tablet  07/24/12  Yes Historical Provider, MD  fluticasone (FLONASE) 50 MCG/ACT nasal spray Place 2 sprays into both nostrils daily. 01/05/13  Yes Margaree MackintoshMary J Baxley, MD  FREESTYLE LITE test strip  11/23/11  Yes Historical Provider, MD  furosemide (LASIX) 40 MG tablet Take 1 tablet (40 mg total) by mouth daily. 09/14/12  Yes Margaree MackintoshMary J Baxley, MD  glipiZIDE (GLUCOTROL XL) 10 MG 24 hr tablet Take 1  tablet (10 mg total) by mouth daily. 03/20/12 03/20/13 Yes Margaree MackintoshMary J Baxley, MD  JANUMET 50-500 MG per tablet Take 2 tablets by mouth 2 (two) times daily with a meal. 07/15/12  Yes Nishant Dhungel, MD  Lancets (FREESTYLE) lancets  11/23/11  Yes Historical Provider, MD  LORazepam (ATIVAN) 1 MG tablet  07/24/12  Yes Historical Provider, MD  losartan (COZAAR) 100 MG tablet Take 1 tablet (100 mg total) by mouth daily. 09/14/12  Yes Margaree MackintoshMary J Baxley, MD  nebivolol (BYSTOLIC) 10 MG tablet Take 1 tablet (10 mg total) by mouth daily. 01/05/13  Yes Margaree MackintoshMary J Baxley, MD    Family History  Family History  Problem Relation Age of Onset  . Stroke Mother   . Hypertension Brother     Social History  History   Social History  . Marital Status: Married    Spouse Name: N/A    Number of Children: 2  . Years of Education: BS   Occupational History  . [employer]    Social History Main Topics  . Smoking status: Never Smoker   . Smokeless tobacco: Never Used  . Alcohol Use: No  . Drug Use: No  . Sexual Activity: Yes   Other Topics Concern  . Not on file   Social History Narrative  . No narrative on file     Review of Systems,  as per HPI, otherwise negative General:  No chills, fever, night sweats or weight changes.  Cardiovascular:  No chest pain, dyspnea on exertion, edema, orthopnea, palpitations, paroxysmal nocturnal dyspnea. Dermatological: No rash, lesions/masses Respiratory: No cough, dyspnea Urologic: No hematuria, dysuria Abdominal:   No nausea, vomiting, diarrhea, bright red blood per rectum, melena, or hematemesis Neurologic:  No visual changes, wkns, changes in mental status. All other systems reviewed and are otherwise negative except as noted above.  Physical Exam  Blood pressure 142/99, pulse 83, height 5' 5.25" (1.657 m), weight 206 lb (93.441 kg).  General: Pleasant, NAD Psych: Normal affect. Neuro: Alert and oriented X 3. Moves all extremities spontaneously. HEENT: Normal  Neck:  Supple without bruits or JVD. Lungs:  Resp regular and unlabored, CTA. Heart: RRR no s3, s4, or murmurs. Abdomen: Soft, non-tender, non-distended, BS + x 4.  Extremities: No clubbing, cyanosis or edema. DP/PT/Radials 2+ and equal bilaterally.  Labs:  No results found for this basename: CKTOTAL, CKMB, TROPONINI,  in the last 72 hours Lab Results  Component Value Date   WBC 7.9 07/15/2012   HGB 15.7 07/15/2012   HCT 48.2 07/15/2012   MCV 78.8 07/15/2012   PLT 220 07/15/2012   No results found for this basename: NA, K, CL, CO2, BUN, CREATININE, CALCIUM, LABALBU, PROT, BILITOT, ALKPHOS, ALT, AST, GLUCOSE,  in the last 168 hours Lab Results  Component Value Date   CHOL 111 01/04/2013   HDL 36* 01/04/2013   LDLCALC 59 01/04/2013   TRIG 78 01/04/2013   No results found for this basename: DDIMER   No components found with this basename: POCBNP,   Accessory Clinical Findings  echocardiogram  ECG - SR, normal ECG  07/14/2012 Study Conclusions  - Left ventricle: The cavity size was normal. There was moderate concentric hypertrophy. Systolic function was normal. The estimated ejection fraction was in the range of 60% to 65%. Wall motion was normal; there were no regional wall motion abnormalities. - Left atrium: The atrium was mildly dilated.    Assessment & Plan  A pleasant 63 year old male with h/o NIDDM and thalamic stroke in June 2014  1. Resistant hypertension - with h/o stroke and an evidence of moderate LVH with diastolic dysfunction on echocardiogram.  We would recommend to continue the current regimen and increase amlodipine to 10 mg po daily. We will also oredr renal arterial duplex to rule out stenosis.  2. Hyperlipidemia - followed by PCP, last checked 1 month ago, at goal  3. DM - HbA1c 6.9%  Follow up in 2 weeks with BP diary (mornings and evenings).    Lars Masson, MD, [facility] 02/01/2013, 3:13 PM

## 2013-02-01 NOTE — Patient Instructions (Signed)
Your physician has recommended you make the following change in your medication:  1) Increase Amlodipine to 10mg  daily  Your physician has requested that you have a renal artery duplex. During this test, an ultrasound is used to evaluate blood flow to the kidneys. Allow one hour for this exam. Do not eat after midnight the day before and avoid carbonated beverages. Take your medications as you usually do.  You have a follow up appt scheduled for 02/17/13 @ 2:15pm

## 2013-02-02 ENCOUNTER — Encounter: Payer: Self-pay | Admitting: Cardiology

## 2013-02-02 ENCOUNTER — Ambulatory Visit (HOSPITAL_COMMUNITY): Payer: 59 | Attending: Cardiology

## 2013-02-02 DIAGNOSIS — E119 Type 2 diabetes mellitus without complications: Secondary | ICD-10-CM | POA: Insufficient documentation

## 2013-02-02 DIAGNOSIS — I10 Essential (primary) hypertension: Secondary | ICD-10-CM | POA: Insufficient documentation

## 2013-02-02 DIAGNOSIS — Z8673 Personal history of transient ischemic attack (TIA), and cerebral infarction without residual deficits: Secondary | ICD-10-CM | POA: Insufficient documentation

## 2013-02-02 DIAGNOSIS — E785 Hyperlipidemia, unspecified: Secondary | ICD-10-CM | POA: Insufficient documentation

## 2013-02-04 NOTE — Progress Notes (Signed)
Quick Note:  Normal renal Duplex, no need to call, per Dr. Delton SeeNelson. ______

## 2013-02-12 ENCOUNTER — Ambulatory Visit: Payer: PRIVATE HEALTH INSURANCE | Admitting: Nurse Practitioner

## 2013-02-15 ENCOUNTER — Encounter: Payer: Self-pay | Admitting: Pulmonary Disease

## 2013-02-15 ENCOUNTER — Ambulatory Visit (INDEPENDENT_AMBULATORY_CARE_PROVIDER_SITE_OTHER): Payer: 59 | Admitting: Pulmonary Disease

## 2013-02-15 VITALS — BP 136/88 | HR 80 | Temp 98.3°F | Ht 66.0 in | Wt 208.8 lb

## 2013-02-15 DIAGNOSIS — R0989 Other specified symptoms and signs involving the circulatory and respiratory systems: Secondary | ICD-10-CM

## 2013-02-15 DIAGNOSIS — R0609 Other forms of dyspnea: Secondary | ICD-10-CM

## 2013-02-15 DIAGNOSIS — R0683 Snoring: Secondary | ICD-10-CM | POA: Insufficient documentation

## 2013-02-15 NOTE — Progress Notes (Signed)
Subjective:    Patient ID: Mike Parker, male    DOB: [birth year], 63 y.o.   MRN: [id number]  HPI The patient is a 63 year old male who I've been asked to see for possible obstructive sleep apnea. The issue has been raised because of difficult to control hypertension, and also a history of CVA. The patient is unsure if he snores, but states that his wife has never complained. She has never mentioned witnessed apneas, and the patient denies choking arousals. He only awakens 1 time a night, and he believes that he is well rested in the mornings upon arising. He feels that his alertness during the day is completely normal, but after lunch he will feel mildly sluggish for a period of time. However, he adamantly denies sleep pressure with inactivity. He has no difficulties watching television or movies in the evening, as long as it is before 10 PM. He has only occasional sleep pressure driving very long distances. His weight has been neutral over the last 2 years, and his Epworth score today is normal at 7.   Sleep Questionnaire What time do you typically go to bed?( Between what hours) 10-11pm 10-11pm at 1612 on 02/15/13 by Maisie FusAshtyn M Green, CMA How long does it take you to fall asleep? 15mins 15mins at 1612 on 02/15/13 by Maisie FusAshtyn M Green, CMA How many times during the night do you wake up? 1 1 at 1612 on 02/15/13 by Maisie FusAshtyn M Green, CMA What time do you get out of bed to start your day? 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 (In pounds) 10 lb (4.536 kg) 10 lb (4.536 kg) at 1612 on 02/15/13 by Maisie FusAshtyn M Green, CMA Have you ever had a sleep study before? No No at 1612 on 02/15/13 by Maisie FusAshtyn M Green, CMA Do you currently use CPAP? No No at 1612 on 02/15/13 by Maisie FusAshtyn M Green, CMA Do  you wear oxygen at any time? No No at 1612 on 02/15/13 by Maisie FusAshtyn M Green, CMA   Review of Systems  Constitutional: Negative for fever and unexpected weight change.  HENT: Negative for congestion, dental problem, ear pain, nosebleeds, postnasal drip, rhinorrhea, sinus pressure, sneezing, sore throat and trouble swallowing.   Eyes: Negative for redness and itching.  Respiratory: Negative for cough, chest tightness, shortness of breath and wheezing.   Cardiovascular: Negative for palpitations and leg swelling.  Gastrointestinal: Negative for nausea and vomiting.  Genitourinary: Negative for dysuria.  Musculoskeletal: Negative for joint swelling.  Skin: Negative for rash.  Neurological: Negative for headaches.  Hematological: Does not bruise/bleed easily.  Psychiatric/Behavioral: Negative for dysphoric mood. The patient is not nervous/anxious.        Objective:   Physical Exam Constitutional:  Obese male, no acute distress  HENT:  Nares patent without discharge, but large turbinates.  Oropharynx without exudate, uvula normal, palate thick and elongated, small space posteriorly   Eyes:  Perrla, eomi, no scleral icterus  Neck:  No JVD, no TMG  Cardiovascular:  Normal rate, regular rhythm, no rubs or gallops.  No murmurs        Intact distal pulses  Pulmonary :  Normal breath sounds, no stridor or respiratory distress   No rales, rhonchi, or wheezing  Abdominal:  Soft, nondistended, bowel  sounds present.  No tenderness noted.   Musculoskeletal:  No significant lower extremity edema noted.  Lymph Nodes:  No cervical lymphadenopathy noted  Skin:  No cyanosis noted  Neurologic:  Alert, appropriate, moves all 4 extremities without obvious deficit.         Assessment & Plan:

## 2013-02-15 NOTE — Patient Instructions (Signed)
Work on weight reduction aggressively Go home and ask wife directly if she has heard snoring or an abnormal breathing pattern during sleep Think about the questions we have discussed today, and let me know if you feel you are more symptomatic than initially thought.  followup with me as needed.

## 2013-02-15 NOTE — Assessment & Plan Note (Signed)
The patient has a history of difficult to control hypertension as well as a CVA, but his history today is not overly impressive for sleep disordered breathing. He is obese with a large neck, and has abnormal upper airway anatomy. However, he denies all of the history that would strongly suggest to me sleep apnea. I have asked him to go home and discuss this with his wife, and make sure that his historical information is accurate. I have also asked him to think about the questions that we have discussed during the visit, and he is to call me if he believes the answers today were not completely accurate.  I've asked him to work aggressively on weight loss, and to call me if he has any worsening symptoms.

## 2013-02-17 ENCOUNTER — Ambulatory Visit (INDEPENDENT_AMBULATORY_CARE_PROVIDER_SITE_OTHER): Payer: 59 | Admitting: Cardiology

## 2013-02-17 ENCOUNTER — Encounter: Payer: Self-pay | Admitting: Cardiology

## 2013-02-17 VITALS — BP 132/78 | HR 72 | Ht 66.0 in | Wt 206.0 lb

## 2013-02-17 DIAGNOSIS — I10 Essential (primary) hypertension: Secondary | ICD-10-CM

## 2013-02-17 DIAGNOSIS — E669 Obesity, unspecified: Secondary | ICD-10-CM

## 2013-02-17 NOTE — Progress Notes (Signed)
Patient ID: Mike LeitzDon K Rodelo, male   DOB: 31-Mar-1950, 63 y.o.   MRN: [id number]    Patient Name: Mike Parker Date of Encounter: 02/17/2013  Primary Care Provider:  Margaree MackintoshBAXLEY,MARY J, MD Primary Cardiologist:  Tobias AlexanderNELSON, Osha Rane, H  Problem List   Past Medical History  Diagnosis Date  . Hypertension   . Allergy   . Diabetes mellitus   . Insomnia    Past Surgical History  Procedure Laterality Date  . Hernia repair      left inguinal  . Knee surgery      rt and left  . Elbow surgery      rt elbow    Allergies  No Known Allergies  HPI  HPI 63 year old male with long-standing history of hypertension and type 2 diabetes mellitus. He had a left thalamic stroke June 2014 with almost complete recovery. His blood pressures been elevated since that time surrounding the stroke.  He is on multiple BP medication regimen, recently his Atenolol was changed to Bystolic (nebivolol), lasix and amlodipine were added. He continues having elevated BP. He has been feeling fatigued since the last Friday and he attributes it to his hypertension. He denies chest pain or shortness of breath. 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 No palpitations, orthopnea or PND.  He is coming after 2 weeks with BP diary, he feels better and has no complains.   Home Medications  Prior to Admission medications   Medication Sig Start Date End Date Taking? Authorizing Provider  amLODipine (NORVASC) 5 MG tablet TAKE 1 TABLET DAILY 07/11/12  Yes Margaree MackintoshMary J Baxley, MD  aspirin EC 81 MG EC tablet Take 1 tablet (81 mg total) by mouth daily. 07/15/12  Yes Nishant Dhungel, MD  atorvastatin (LIPITOR) 10 MG tablet  07/24/12  Yes Historical Provider, MD  fluticasone (FLONASE) 50 MCG/ACT nasal spray Place 2 sprays into both nostrils daily. 01/05/13  Yes Margaree MackintoshMary J Baxley, MD  FREESTYLE LITE test strip  11/23/11  Yes Historical Provider, MD  furosemide (LASIX) 40 MG tablet Take 1 tablet (40 mg total) by mouth daily.  09/14/12  Yes Margaree MackintoshMary J Baxley, MD  glipiZIDE (GLUCOTROL XL) 10 MG 24 hr tablet Take 1 tablet (10 mg total) by mouth daily. 03/20/12 03/20/13 Yes Margaree MackintoshMary J Baxley, MD  JANUMET 50-500 MG per tablet Take 2 tablets by mouth 2 (two) times daily with a meal. 07/15/12  Yes Nishant Dhungel, MD  Lancets (FREESTYLE) lancets  11/23/11  Yes Historical Provider, MD  LORazepam (ATIVAN) 1 MG tablet  07/24/12  Yes Historical Provider, MD  losartan (COZAAR) 100 MG tablet Take 1 tablet (100 mg total) by mouth daily. 09/14/12  Yes Margaree MackintoshMary J Baxley, MD  nebivolol (BYSTOLIC) 10 MG tablet Take 1 tablet (10 mg total) by mouth daily. 01/05/13  Yes Margaree MackintoshMary J Baxley, MD    Family History  Family History  Problem Relation Age of Onset  . Stroke Mother   . Hypertension Brother     Social History  History   Social History  . Marital Status: Married    Spouse Name: N/A    Number of Children: 2  . 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 Smoking status: Never Smoker   . Smokeless tobacco: Never Used  . Alcohol Use: No  . Drug Use: No  . Sexual Activity: Yes   Other Topics Concern  . Not on  file   Social History Narrative  . No narrative on file     Review of Systems, as per HPI, otherwise negative General:  No chills, fever, night sweats or weight changes.  Cardiovascular:  No chest pain, dyspnea on exertion, edema, orthopnea, palpitations, paroxysmal nocturnal dyspnea. Dermatological: No rash, lesions/masses Respiratory: No cough, dyspnea Urologic: No hematuria, dysuria Abdominal:   No nausea, vomiting, diarrhea, bright red blood per rectum, melena, or hematemesis Neurologic:  No visual changes, wkns, changes in mental status. All other systems reviewed and are otherwise negative except as noted above.  Physical Exam  Blood pressure 132/78, pulse 72, height 5\' 6"  (1.676 m), weight 206 lb (93.441 kg).  General: Pleasant, NAD Psych: Normal  affect. Neuro: Alert and oriented X 3. Moves all extremities spontaneously. HEENT: Normal  Neck: Supple without bruits or JVD. Lungs:  Resp regular and unlabored, CTA. Heart: RRR no s3, s4, or murmurs. Abdomen: Soft, non-tender, non-distended, BS + x 4.  Extremities: No clubbing, cyanosis or edema. DP/PT/Radials 2+ and equal bilaterally.  Labs:  No results found for this basename: CKTOTAL, CKMB, TROPONINI,  in the last 72 hours Lab Results  Component Value Date   WBC 7.9 07/15/2012   HGB 15.7 07/15/2012   HCT 48.2 07/15/2012   MCV 78.8 07/15/2012   PLT 220 07/15/2012   No results found for this basename: NA, K, CL, CO2, BUN, CREATININE, CALCIUM, LABALBU, PROT, BILITOT, ALKPHOS, ALT, AST, GLUCOSE,  in the last 168 hours Lab Results  Component Value Date   CHOL 111 01/04/2013   HDL 36* 01/04/2013   LDLCALC 59 01/04/2013   TRIG 78 01/04/2013   Accessory Clinical Findings  ECG - SR, normal ECG  Echocardiogram 07/11/2012  - Left ventricle: The cavity size was normal. There was moderate concentric hypertrophy. Systolic function was normal. The estimated ejection fraction was in the range of 60% to 65%. Wall motion was normal; there were no regional wall motion abnormalities. - Left atrium: The atrium was mildly dilated.   Assessment & Plan  A pleasant 63 year old male with h/o NIDDM and thalamic stroke in June 2014  1. Resistant hypertension - with h/o stroke and an evidence of moderate LVH with diastolic dysfunction on echocardiogram. His BP is controlled after increasing his amlodipine to 10 mg po daily. Majority of his BP measurements were normal with exeption of early mornings ones. He takes all his meds in the morning.  A renal arterial duplex was negative for stenosis, both kidneys are normal size.  He was advised to take amlodipine in the evening and all other meds in the am to cover night and early morning hours better. He is also encourage to start walking for 30 minutes  3x/week that should also help with obesity.He is motivated to do so.  2. Hyperlipidemia - followed by PCP, last checked 1 month ago, at goal  3. DM - HbA1c 6.9%Follow up in 2 weeks with BP diary (mornings and evenings).    Follow up in 3 months with BP diary for 7 days prior to the next visit.    Lars Masson, MD, [facility]  02/17/2013, 2:48 PM

## 2013-02-17 NOTE — Patient Instructions (Signed)
Your physician recommends that you continue on your current medications as directed. Please refer to the Current Medication list given to you today.  Your physician wants you to follow-up in: 3 months. You will receive a reminder letter in the mail two months in advance. If you Orlin't receive a letter, please call our office to schedule the follow-up appointment.  

## 2013-02-26 ENCOUNTER — Other Ambulatory Visit: Payer: Self-pay

## 2013-02-26 MED ORDER — AMLODIPINE BESYLATE 10 MG PO TABS: 10.0000 mg | ORAL_TABLET | Freq: Every day | ORAL | Status: DC

## 2013-03-07 ENCOUNTER — Other Ambulatory Visit: Payer: Self-pay | Admitting: Internal Medicine

## 2013-03-10 ENCOUNTER — Other Ambulatory Visit: Payer: Self-pay | Admitting: Internal Medicine

## 2013-05-19 ENCOUNTER — Encounter: Payer: Self-pay | Admitting: Cardiology

## 2013-05-19 ENCOUNTER — Ambulatory Visit (INDEPENDENT_AMBULATORY_CARE_PROVIDER_SITE_OTHER): Payer: 59 | Admitting: Cardiology

## 2013-05-19 VITALS — BP 152/92 | HR 68 | Ht 66.0 in | Wt 202.4 lb

## 2013-05-19 DIAGNOSIS — I10 Essential (primary) hypertension: Secondary | ICD-10-CM

## 2013-05-19 MED ORDER — CLONIDINE HCL 0.1 MG PO TABS: 0.1000 mg | ORAL_TABLET | Freq: Two times a day (BID) | ORAL | Status: DC

## 2013-05-19 NOTE — Patient Instructions (Signed)
COME IN 3 WEEKS FOR A BP CHECK   Your physician wants you to follow-up in:  6 MONTHS WITH DR Johnell ComingsNELSON  You will receive a reminder letter in the mail two months in advance. If you Ewin't receive a letter, please call our office to schedule the follow-up appointment.  START TAKING CLONIDINE 0.1 MG TWO TIMES DAILY

## 2013-05-19 NOTE — Addendum Note (Signed)
Addended by: Loa SocksMARTIN, Kimarion Chery M on: 05/19/2013 09:01 AM   Modules accepted: Orders

## 2013-05-19 NOTE — Progress Notes (Signed)
Patient ID: Mike Parker, male   DOB: February 12, 1950, 63 y.o.   MRN: [id number]    Patient Name: Mike Parker Date of Encounter: 05/19/2013  Primary Care Provider:  Margaree MackintoshBAXLEY,MARY J, MD Primary Cardiologist:  Lars MassonKatarina H Mahlet Jergens  Problem List   Past Medical History  Diagnosis Date  . Hypertension   . Allergy   . Diabetes mellitus   . Insomnia    Past Surgical History  Procedure Laterality Date  . Hernia repair      left inguinal  . Knee surgery      rt and left  . Elbow surgery      rt elbow    Allergies  No Known Allergies  HPI  HPI 63 year old male with long-standing history of hypertension and type 2 diabetes mellitus. He had a left thalamic stroke June 2014 with almost complete recovery. His blood pressures been elevated since that time surrounding the stroke.  He is on multiple BP medication regimen, recently his Atenolol was changed to Bystolic (nebivolol), lasix and amlodipine were added. He continues having elevated BP. He has been feeling fatigued since the last Friday and he attributes it to his hypertension. He denies chest pain or shortness of breath. 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 No palpitations, orthopnea or PND.  He is coming after 3 months with BP diary, all BP in 150-160 mmHg. He otherwise has no complains, denies DOE, CP, dizziness, palpitations or LE edema, but feels stressed about his BP readings.  Home Medications  Prior to Admission medications   Medication Sig Start Date End Date Taking? Authorizing Provider  amLODipine (NORVASC) 5 MG tablet TAKE 1 TABLET DAILY 07/11/12  Yes Margaree MackintoshMary J Baxley, MD  aspirin EC 81 MG EC tablet Take 1 tablet (81 mg total) by mouth daily. 07/15/12  Yes Nishant Dhungel, MD  atorvastatin (LIPITOR) 10 MG tablet  07/24/12  Yes Historical Provider, MD  fluticasone (FLONASE) 50 MCG/ACT nasal spray Place 2 sprays into both nostrils daily. 01/05/13  Yes Margaree MackintoshMary J Baxley, MD  FREESTYLE LITE test strip  11/23/11   Yes Historical Provider, MD  furosemide (LASIX) 40 MG tablet Take 1 tablet (40 mg total) by mouth daily. 09/14/12  Yes Margaree MackintoshMary J Baxley, MD  glipiZIDE (GLUCOTROL XL) 10 MG 24 hr tablet Take 1 tablet (10 mg total) by mouth daily. 03/20/12 03/20/13 Yes Margaree MackintoshMary J Baxley, MD  JANUMET 50-500 MG per tablet Take 2 tablets by mouth 2 (two) times daily with a meal. 07/15/12  Yes Nishant Dhungel, MD  Lancets (FREESTYLE) lancets  11/23/11  Yes Historical Provider, MD  LORazepam (ATIVAN) 1 MG tablet  07/24/12  Yes Historical Provider, MD  losartan (COZAAR) 100 MG tablet Take 1 tablet (100 mg total) by mouth daily. 09/14/12  Yes Margaree MackintoshMary J Baxley, MD  nebivolol (BYSTOLIC) 10 MG tablet Take 1 tablet (10 mg total) by mouth daily. 01/05/13  Yes Margaree MackintoshMary J Baxley, MD    Family History  Family History  Problem Relation Age of Onset  . Stroke Mother   . Hypertension Brother     Social History  History   Social History  . Marital Status: Married    Spouse Name: N/A    Number of Children: 2  . 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 Smoking status: Never Smoker   . Smokeless tobacco: Never Used  . Alcohol Use: No  .  Drug Use: No  . Sexual Activity: Yes   Other Topics Concern  . Not on file   Social History Narrative  . No narrative on file     Review of Systems, as per HPI, otherwise negative General:  No chills, fever, night sweats or weight changes.  Cardiovascular:  No chest pain, dyspnea on exertion, edema, orthopnea, palpitations, paroxysmal nocturnal dyspnea. Dermatological: No rash, lesions/masses Respiratory: No cough, dyspnea Urologic: No hematuria, dysuria Abdominal:   No nausea, vomiting, diarrhea, bright red blood per rectum, melena, or hematemesis Neurologic:  No visual changes, wkns, changes in mental status. All other systems reviewed and are otherwise negative except as noted above.  Physical Exam  Blood pressure 152/92,  pulse 68, height 5\' 6"  (1.676 m), weight 202 lb 6.4 oz (91.808 kg).  General: Pleasant, NAD Psych: Normal affect. Neuro: Alert and oriented X 3. Moves all extremities spontaneously. HEENT: Normal  Neck: Supple without bruits or JVD. Lungs:  Resp regular and unlabored, CTA. Heart: RRR no s3, s4, or murmurs. Abdomen: Soft, non-tender, non-distended, BS + x 4.  Extremities: No clubbing, cyanosis or edema. DP/PT/Radials 2+ and equal bilaterally.  Labs:  No results found for this basename: CKTOTAL, CKMB, TROPONINI,  in the last 72 hours Lab Results  Component Value Date   WBC 7.9 07/15/2012   HGB 15.7 07/15/2012   HCT 48.2 07/15/2012   MCV 78.8 07/15/2012   PLT 220 07/15/2012   No results found for this basename: NA, K, CL, CO2, BUN, CREATININE, CALCIUM, LABALBU, PROT, BILITOT, ALKPHOS, ALT, AST, GLUCOSE,  in the last 168 hours Lab Results  Component Value Date   CHOL 111 01/04/2013   HDL 36* 01/04/2013   LDLCALC 59 01/04/2013   TRIG 78 01/04/2013   Accessory Clinical Findings  ECG - SR, normal ECG  Echocardiogram 07/11/2012  - Left ventricle: The cavity size was normal. There was moderate concentric hypertrophy. Systolic function was normal. The estimated ejection fraction was in the range of 60% to 65%. Wall motion was normal; there were no regional wall motion abnormalities. - Left atrium: The atrium was mildly dilated.   Assessment & Plan  A pleasant 63 year old male with h/o NIDDM and thalamic stroke in June 2014  1. Resistant hypertension - with h/o stroke and an evidence of moderate LVH with diastolic dysfunction on echocardiogram. His BP is controlled after increasing his amlodipine to 10 mg po daily. Majority of his BP measurements were normal with exeption of early mornings ones. He takes all his meds in the morning.  A renal arterial duplex was negative for stenosis, both kidneys are normal size. He was advised to take amlodipine in the evening and all other meds in the  am to cover night and early morning hours better. He is also encourage to start walking for 30 minutes 3x/week that should also help with obesity.He is motivated to do so.  Today we will add Clonidine 0.1 mg PO BID and follow in 2-3 weeks for a nurse visit to check BP. If still uncontrolled we can increase to 0.2 mg PO BID.  2. Hyperlipidemia - followed by PCP, last checked 1 month ago, at goal  3. DM - HbA1c 6.9%. Follow up in 2 weeks with BP diary (mornings and evenings).    Follow up in 3 months with BP diary for 7 days prior to the next visit.    Lars MassonKatarina H Santasia Rew, MD, [facility]  05/19/2013, 8:37 AM

## 2013-06-06 ENCOUNTER — Other Ambulatory Visit: Payer: Self-pay | Admitting: Internal Medicine

## 2013-06-07 ENCOUNTER — Ambulatory Visit (INDEPENDENT_AMBULATORY_CARE_PROVIDER_SITE_OTHER): Payer: 59 | Admitting: *Deleted

## 2013-06-07 VITALS — BP 136/84 | Ht 66.0 in | Wt 202.0 lb

## 2013-06-07 DIAGNOSIS — I10 Essential (primary) hypertension: Secondary | ICD-10-CM

## 2013-06-07 NOTE — Patient Instructions (Signed)
Your physician recommends that you continue on your current medications as directed. Please refer to the Current Medication list given to you today.     

## 2013-06-11 ENCOUNTER — Other Ambulatory Visit: Payer: Self-pay

## 2013-06-11 MED ORDER — CLONIDINE HCL 0.1 MG PO TABS: 0.1000 mg | ORAL_TABLET | Freq: Two times a day (BID) | ORAL | Status: DC

## 2013-07-15 ENCOUNTER — Other Ambulatory Visit: Payer: Self-pay | Admitting: Internal Medicine

## 2013-08-23 ENCOUNTER — Other Ambulatory Visit: Payer: PRIVATE HEALTH INSURANCE | Admitting: Internal Medicine

## 2013-08-24 ENCOUNTER — Encounter: Payer: PRIVATE HEALTH INSURANCE | Admitting: Internal Medicine

## 2013-08-27 ENCOUNTER — Other Ambulatory Visit: Payer: PRIVATE HEALTH INSURANCE | Admitting: Internal Medicine

## 2013-08-30 ENCOUNTER — Ambulatory Visit (INDEPENDENT_AMBULATORY_CARE_PROVIDER_SITE_OTHER): Payer: PRIVATE HEALTH INSURANCE | Admitting: Internal Medicine

## 2013-08-30 ENCOUNTER — Encounter: Payer: Self-pay | Admitting: Internal Medicine

## 2013-08-30 ENCOUNTER — Other Ambulatory Visit: Payer: PRIVATE HEALTH INSURANCE | Admitting: Internal Medicine

## 2013-08-30 ENCOUNTER — Other Ambulatory Visit: Payer: Self-pay | Admitting: Internal Medicine

## 2013-08-30 VITALS — BP 138/82 | HR 68 | Temp 97.7°F | Ht 65.0 in | Wt 200.0 lb

## 2013-08-30 VITALS — BP 138/82 | HR 68 | Temp 97.7°F | Resp 12 | Ht 65.0 in | Wt 200.0 lb

## 2013-08-30 DIAGNOSIS — R0609 Other forms of dyspnea: Secondary | ICD-10-CM

## 2013-08-30 DIAGNOSIS — I6350 Cerebral infarction due to unspecified occlusion or stenosis of unspecified cerebral artery: Secondary | ICD-10-CM

## 2013-08-30 DIAGNOSIS — E669 Obesity, unspecified: Secondary | ICD-10-CM

## 2013-08-30 DIAGNOSIS — Z0000 Encounter for general adult medical examination without abnormal findings: Secondary | ICD-10-CM

## 2013-08-30 DIAGNOSIS — I63212 Cerebral infarction due to unspecified occlusion or stenosis of left vertebral arteries: Secondary | ICD-10-CM

## 2013-08-30 DIAGNOSIS — E785 Hyperlipidemia, unspecified: Secondary | ICD-10-CM

## 2013-08-30 DIAGNOSIS — I6381 Other cerebral infarction due to occlusion or stenosis of small artery: Secondary | ICD-10-CM

## 2013-08-30 DIAGNOSIS — G4700 Insomnia, unspecified: Secondary | ICD-10-CM

## 2013-08-30 DIAGNOSIS — I10 Essential (primary) hypertension: Secondary | ICD-10-CM

## 2013-08-30 DIAGNOSIS — Z125 Encounter for screening for malignant neoplasm of prostate: Secondary | ICD-10-CM

## 2013-08-30 DIAGNOSIS — I639 Cerebral infarction, unspecified: Secondary | ICD-10-CM

## 2013-08-30 DIAGNOSIS — Z79899 Other long term (current) drug therapy: Secondary | ICD-10-CM

## 2013-08-30 DIAGNOSIS — E8881 Metabolic syndrome: Secondary | ICD-10-CM

## 2013-08-30 DIAGNOSIS — J309 Allergic rhinitis, unspecified: Secondary | ICD-10-CM

## 2013-08-30 DIAGNOSIS — R0683 Snoring: Secondary | ICD-10-CM

## 2013-08-30 DIAGNOSIS — E119 Type 2 diabetes mellitus without complications: Secondary | ICD-10-CM

## 2013-08-30 DIAGNOSIS — I6322 Cerebral infarction due to unspecified occlusion or stenosis of basilar arteries: Secondary | ICD-10-CM

## 2013-08-30 DIAGNOSIS — R0989 Other specified symptoms and signs involving the circulatory and respiratory systems: Secondary | ICD-10-CM

## 2013-08-30 LAB — POCT URINALYSIS DIPSTICK
BILIRUBIN UA: NEGATIVE
GLUCOSE UA: NEGATIVE
Ketones, UA: NEGATIVE
LEUKOCYTES UA: NEGATIVE
Nitrite, UA: NEGATIVE
PH UA: 6.5
Protein, UA: NEGATIVE
RBC UA: NEGATIVE
Spec Grav, UA: <=1.005
Urobilinogen, UA: NEGATIVE

## 2013-08-30 LAB — CBC WITH DIFFERENTIAL/PLATELET
BASOS ABS: 0.1 10*3/uL (ref 0.0–0.1)
Basophils Relative: 1 % (ref 0–1)
EOS ABS: 0.1 10*3/uL (ref 0.0–0.7)
EOS PCT: 2 % (ref 0–5)
HCT: 43.7 % (ref 39.0–52.0)
Hemoglobin: 14.4 g/dL (ref 13.0–17.0)
LYMPHS PCT: 26 % (ref 12–46)
Lymphs Abs: 1.5 10*3/uL (ref 0.7–4.0)
MCH: 25.9 pg — AB (ref 26.0–34.0)
MCHC: 33 g/dL (ref 30.0–36.0)
MCV: 78.6 fL (ref 78.0–100.0)
Monocytes Absolute: 0.5 10*3/uL (ref 0.1–1.0)
Monocytes Relative: 9 % (ref 3–12)
Neutro Abs: 3.5 10*3/uL (ref 1.7–7.7)
Neutrophils Relative %: 62 % (ref 43–77)
PLATELETS: 219 10*3/uL (ref 150–400)
RBC: 5.56 MIL/uL (ref 4.22–5.81)
RDW: 15.2 % (ref 11.5–15.5)
WBC: 5.6 10*3/uL (ref 4.0–10.5)

## 2013-08-30 LAB — HEMOGLOBIN A1C
HEMOGLOBIN A1C: 6.7 % — AB (ref <5.7)
Mean Plasma Glucose: 146 mg/dL — ABNORMAL HIGH (ref <117)

## 2013-08-30 NOTE — Addendum Note (Signed)
Addended by: Judy PimpleEILAND, Elli Groesbeck M on: 08/30/2013 04:06 PM   Modules accepted: Orders

## 2013-08-31 LAB — COMPREHENSIVE METABOLIC PANEL
ALT: 27 U/L (ref 0–53)
AST: 21 U/L (ref 0–37)
Albumin: 4.5 g/dL (ref 3.5–5.2)
Alkaline Phosphatase: 58 U/L (ref 39–117)
BUN: 13 mg/dL (ref 6–23)
CO2: 27 mEq/L (ref 19–32)
CREATININE: 1.01 mg/dL (ref 0.50–1.35)
Calcium: 9.4 mg/dL (ref 8.4–10.5)
Chloride: 102 mEq/L (ref 96–112)
Glucose, Bld: 109 mg/dL — ABNORMAL HIGH (ref 70–99)
Potassium: 4.2 mEq/L (ref 3.5–5.3)
SODIUM: 140 meq/L (ref 135–145)
TOTAL PROTEIN: 7.1 g/dL (ref 6.0–8.3)
Total Bilirubin: 0.8 mg/dL (ref 0.2–1.2)

## 2013-08-31 LAB — LIPID PANEL
CHOL/HDL RATIO: 3 ratio
Cholesterol: 111 mg/dL (ref 0–200)
HDL: 37 mg/dL — ABNORMAL LOW (ref >39)
LDL CALC: 58 mg/dL (ref 0–99)
Triglycerides: 80 mg/dL (ref <150)
VLDL: 16 mg/dL (ref 0–40)

## 2013-08-31 LAB — MICROALBUMIN, URINE: Microalb, Ur: 0.5 mg/dL (ref 0.00–1.89)

## 2013-08-31 LAB — PSA: PSA: 1.01 ng/mL (ref <=4.00)

## 2013-09-02 ENCOUNTER — Ambulatory Visit (HOSPITAL_COMMUNITY): Payer: 59

## 2013-09-04 ENCOUNTER — Other Ambulatory Visit: Payer: Self-pay | Admitting: Internal Medicine

## 2013-09-09 ENCOUNTER — Other Ambulatory Visit: Payer: Self-pay | Admitting: Internal Medicine

## 2013-09-10 ENCOUNTER — Other Ambulatory Visit: Payer: Self-pay | Admitting: Internal Medicine

## 2013-09-10 NOTE — Telephone Encounter (Signed)
Refill x 6 months 

## 2013-09-21 ENCOUNTER — Encounter: Payer: Self-pay | Admitting: Internal Medicine

## 2013-09-21 NOTE — Progress Notes (Addendum)
Subjective:    Patient ID: Mike Parker, male    DOB: Apr 12, 1950, 63 y.o.   MRN: [id number]  HPI  63 year old Black male in today for health maintenance exam and evaluation of medical issues. History of hypertension and controlled type 2 diabetes mellitus. Mild obesity. History of left thalamic CVA in June 2014. Still has residual numbness in right upper extremity from CVA. Has been swallow by cardiology for hypertension. Blood pressure now well controlled since increasing amlodipine last year to 10 mg daily in addition to other medications. 2-D echocardiogram done by cardiologist showed moderate LVH and diastolic dysfunction. Renal artery duplex scan was negative for renal artery stenosis. He was evaluated by Dr. Shelle Iron for possible sleep apnea with snoring Dr. Astrid Divine that he just needed to lose weight. Sleep study was not done.  Patient is maintained on glipizide in January it for diabetes. He takes Cozaar amlodipine Lasix clonidine and diastolic for hypertension. He is on Lipitor for hyperlipidemia. Takes Ativan for sleep. Issues with insomnia started after stroke which frightened him quite a bit. Says mother died of stroke and that is disconcerting to him.  Social history: He is married with 2 adult daughters. Does not smoke or consume alcohol. Works for the [employer]. Is thinking about retiring around January 2016.  Family history: Mother with history of stroke  No known drug allergies  History of frozen shoulder treated by Dr. Teressa Senter.  Gets annual influenza immunization. Had colonoscopy 2004. Tetanus immunization 2011.    Review of Systems  Constitutional: Negative.   Eyes:       Reminded about annual diabetic eye exam  Respiratory: Negative.   Cardiovascular: Negative.   Gastrointestinal: Negative.   Endocrine: Negative.   Genitourinary: Negative.   Allergic/Immunologic: Positive for environmental allergies.  Neurological:       Residual right arm numbness from stroke    Hematological: Negative.   Psychiatric/Behavioral:       History of insomnia status post stroke       Objective:   Physical Exam  Vitals reviewed. Constitutional: He is oriented to person, place, and time. He appears well-developed and well-nourished. No distress.  HENT:  Head: Normocephalic and atraumatic.  Right Ear: External ear normal.  Left Ear: External ear normal.  Nose: Nose normal.  Mouth/Throat: Oropharynx is clear and moist. No oropharyngeal exudate.  Eyes: Conjunctivae and EOM are normal. Pupils are equal, round, and reactive to light. Right eye exhibits no discharge. Left eye exhibits no discharge. No scleral icterus.  Neck: Neck supple. No JVD present. No thyromegaly present.  Cardiovascular: Normal rate, regular rhythm, normal heart sounds and intact distal pulses.   No murmur heard. Pulmonary/Chest: Effort normal and breath sounds normal. No respiratory distress. He has no wheezes. He has no rales. He exhibits no tenderness.  Abdominal: Soft. Bowel sounds are normal. He exhibits no distension and no mass. There is no tenderness. There is no rebound and no guarding.  Genitourinary: Prostate normal.  Musculoskeletal: Normal range of motion. He exhibits no edema.  Lymphadenopathy:    He has no cervical adenopathy.  Neurological: He is alert and oriented to person, place, and time. He has normal reflexes. He displays normal reflexes. No cranial nerve deficit. Coordination normal.  Skin: Skin is warm and dry. No rash noted. He is not diaphoretic.  Psychiatric: He has a normal mood and affect. His behavior is normal. Judgment and thought content normal.          Assessment & Plan:  Normal health maintenance exam  History of 1 cm left thalamic stroke June 2014 with residual right arm numbness but good range of motion and normal strength. Treated with aspirin. Had seen Dr. Pearlean Brownie  Resistant hypertension-treated him about her cardiology and is stable and followed by  them  Controlled type 2 diabetes mellitus  Obesity-needs to lose weight  History of snoring-these lose weight  Hyperlipidemia-treated with statin  History of frozen shoulder-resolved with physical therapy  History of allergic rhinitis-treated with steroid nasal spray  History of insomnia treated with lorazepam  Plan: Reminded about colonoscopy. Order given for Zostavax vaccine if pharmacy. Reminded about annual diabetic eye exam. Return in 6 months. Flu vaccine today given Fall 2015.

## 2013-09-22 ENCOUNTER — Encounter: Payer: Self-pay | Admitting: Internal Medicine

## 2013-09-22 NOTE — Progress Notes (Deleted)
   Subjective:    Patient ID: Mike Parker, male    DOB: 1951-01-03, 63 y.o.   MRN: [id number]  HPI    Review of Systems     Objective:   Physical Exam        Assessment & Plan:

## 2013-09-22 NOTE — Patient Instructions (Signed)
Please follow strict diabetic diet, exercise daily and lose weight. Return in 6 months. Flu vaccine to be given in the fall. Reminded about diabetic eye exam and colonoscopy

## 2013-09-22 NOTE — Progress Notes (Signed)
   Subjective:    Patient ID: Mike Parker, male    DOB: 11/21/1950, 62 y.o.   MRN: [id number]  HPI    Review of Systems     Objective:   Physical Exam        Assessment & Plan:   

## 2013-10-27 ENCOUNTER — Other Ambulatory Visit: Payer: Self-pay

## 2013-10-27 MED ORDER — ATORVASTATIN CALCIUM 10 MG PO TABS: ORAL_TABLET | ORAL | Status: DC

## 2013-11-07 ENCOUNTER — Other Ambulatory Visit: Payer: Self-pay | Admitting: Internal Medicine

## 2013-11-10 ENCOUNTER — Other Ambulatory Visit: Payer: Self-pay | Admitting: Cardiology

## 2013-11-12 IMAGING — CT CT HEAD W/O CM
2 series · 16 of 30 positions shown, 20 images · non-contrast
Comparison: None.

CLINICAL DATA: Right-sided numbness.

CT HEAD WITHOUT CONTRAST
TECHNIQUE: Contiguous axial images were obtained from the base of
the skull through the vertex without contrast.

[Series 2: head w/o · axial · non-contrast · 0.43mm/px · z∈[-93,+27]mm · 13 of 30 slices shown, 17 images]
[im 3/30  brain]
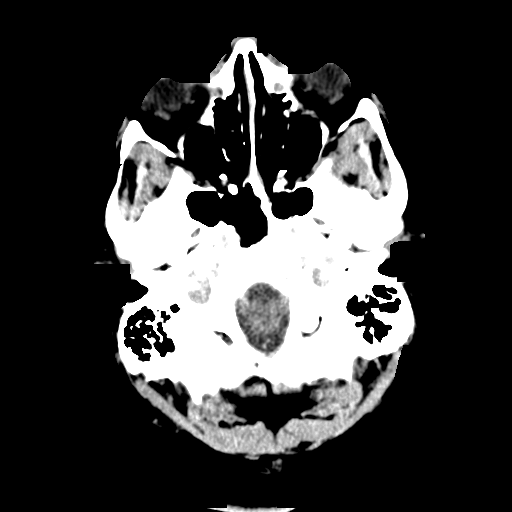
[im 3/30  bone]
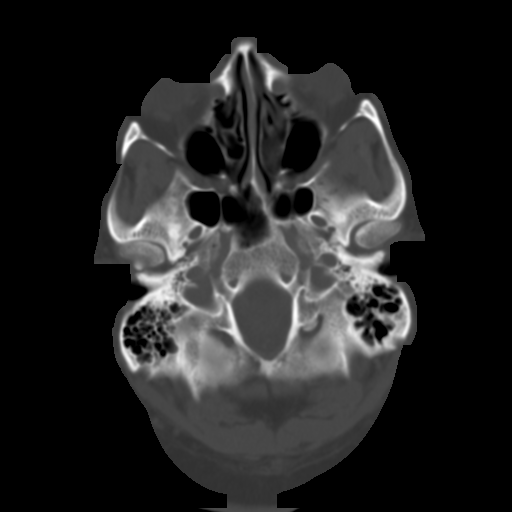
[im 5/30  brain]
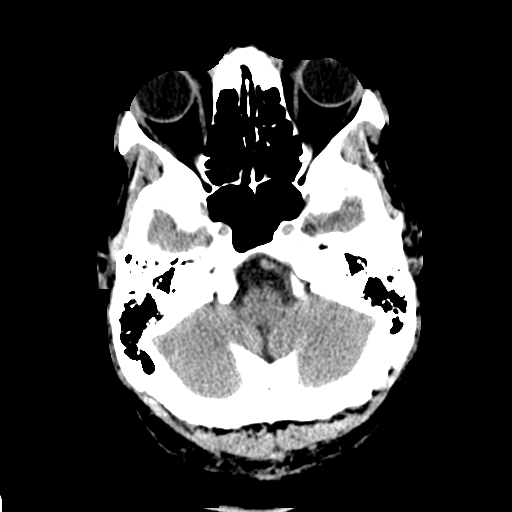
[im 7/30  brain]
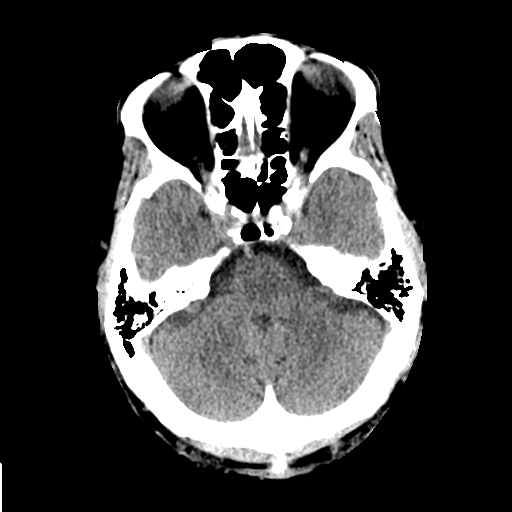
[im 9/30  brain]
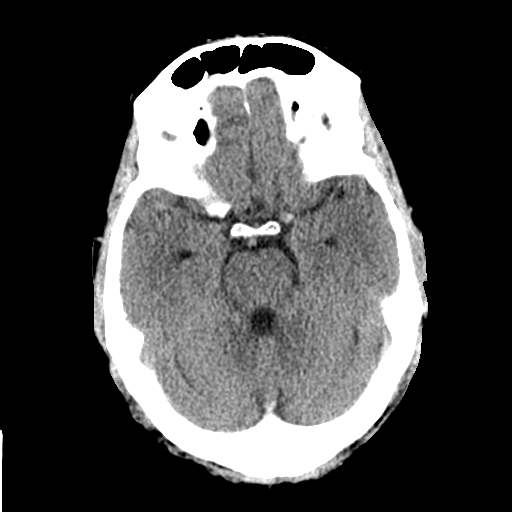
[im 11/30  brain]
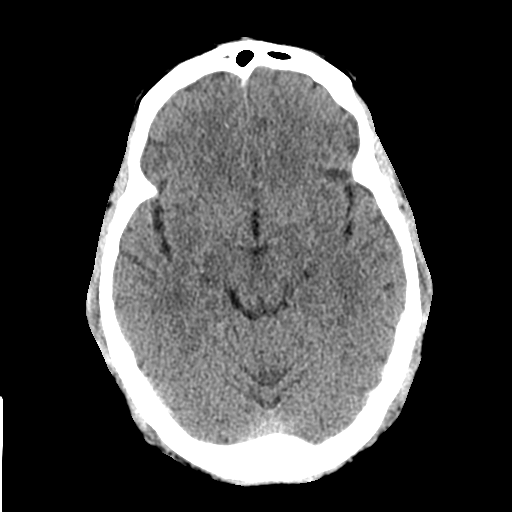
[im 11/30  bone]
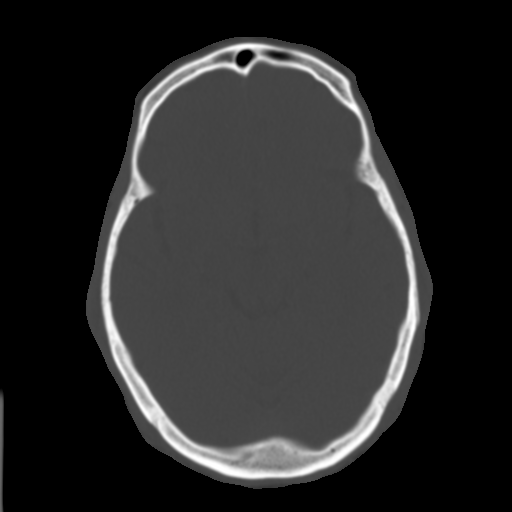
[im 13/30  brain]
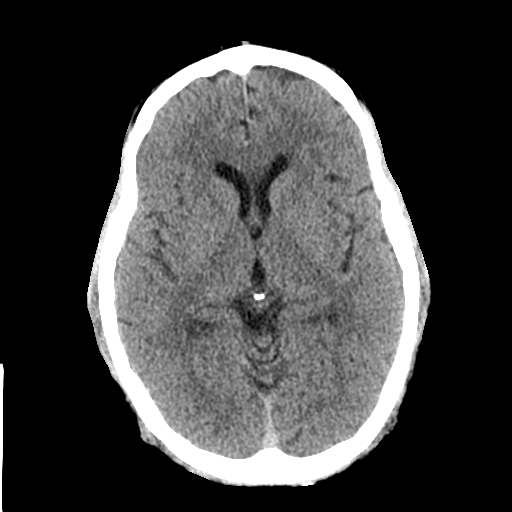
[im 15/30  brain]
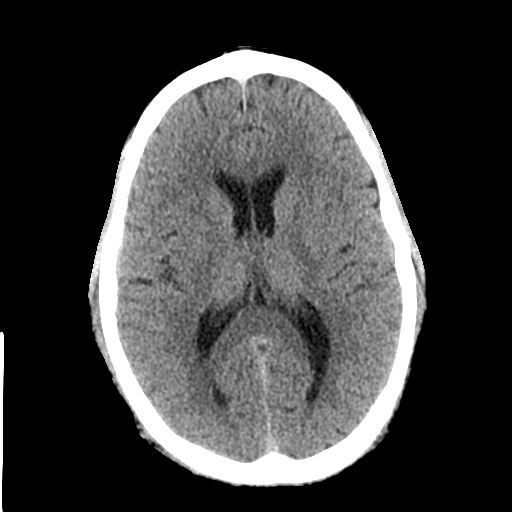
[im 17/30  brain]
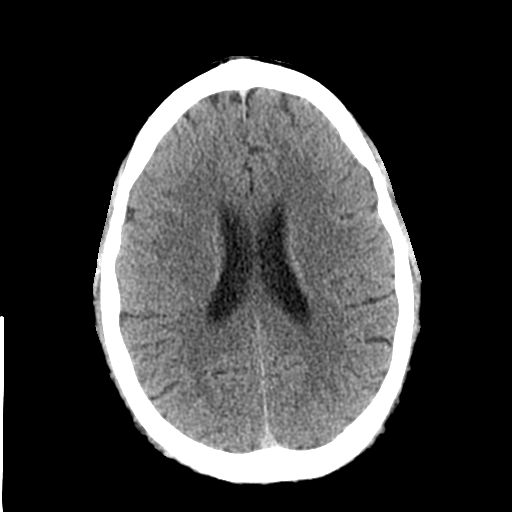
[im 19/30  brain]
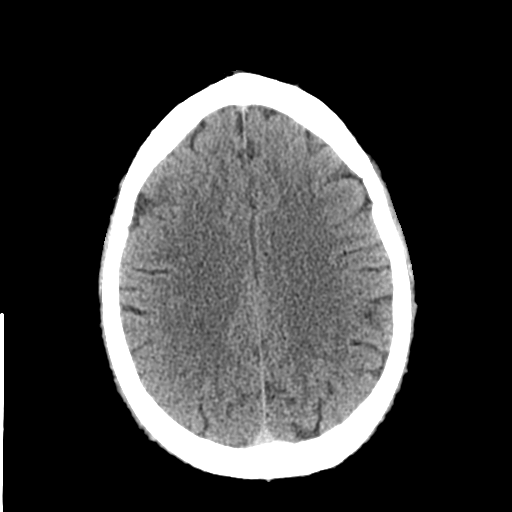
[im 19/30  bone]
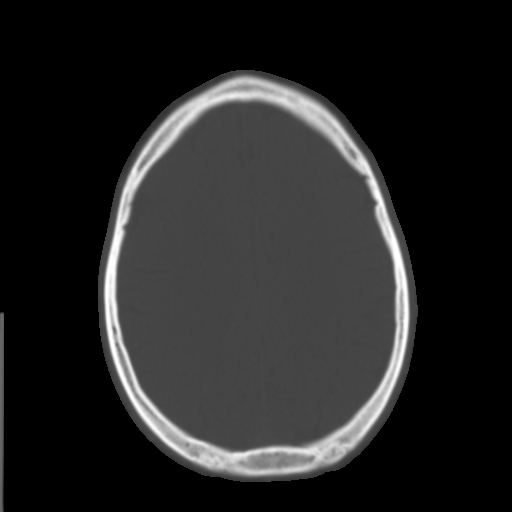
[im 21/30  brain]
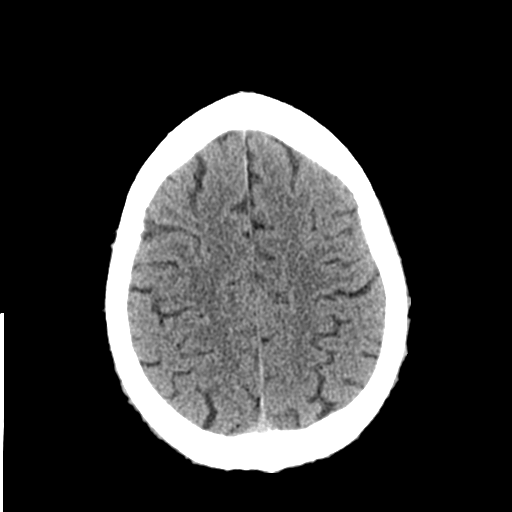
[im 23/30  brain]
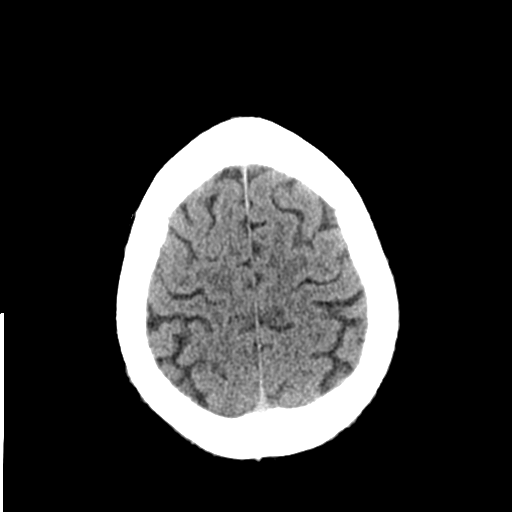
[im 25/30  brain]
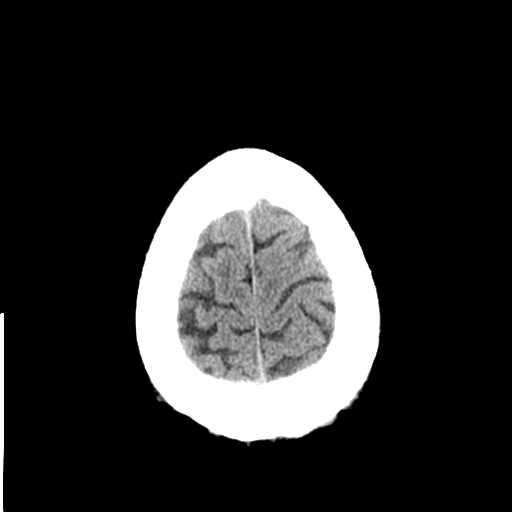
[im 27/30  brain]
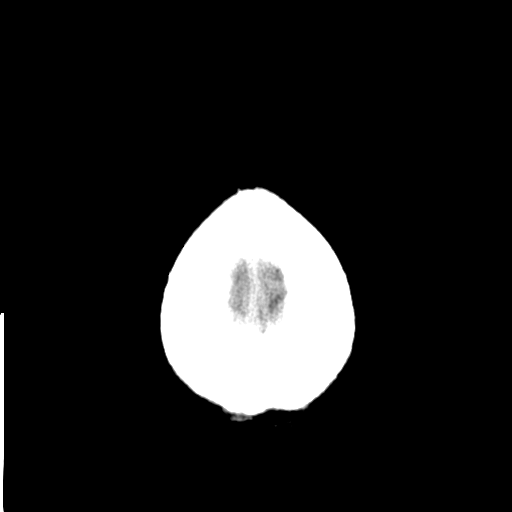
[im 27/30  bone]
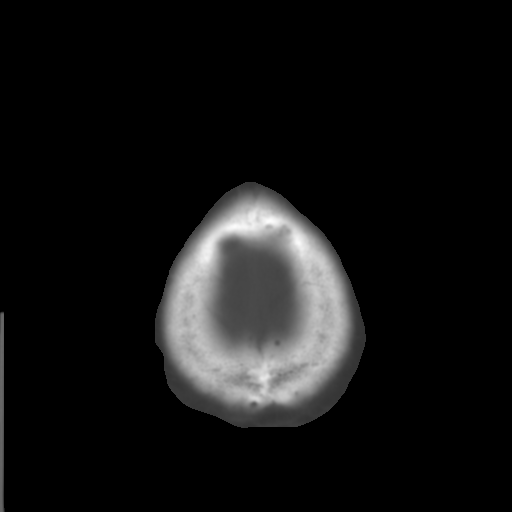

[Series 3: bone windows · axial · 0.43mm/px · z∈[-93,-53]mm · 3 of 30 slices shown]
[im 3/30  bone]
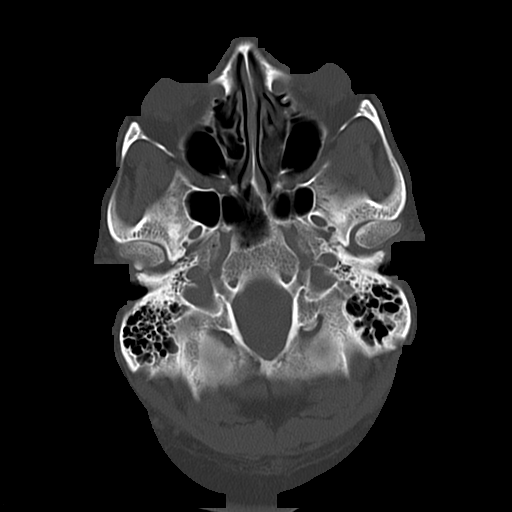
[im 7/30  bone]
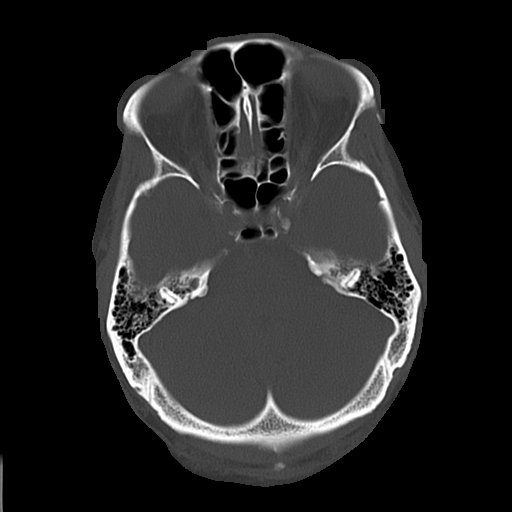
[im 11/30  bone]
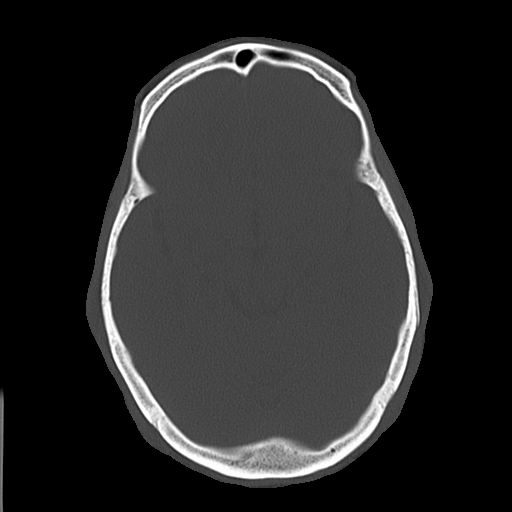

[16 of 30 positions shown; findings below may reference images not displayed]

FINDINGS: The ventricles are normal.  No extra-axial fluid
collections are seen.  The brainstem and cerebellum are
unremarkable.  No acute intracranial findings such as infarction or
hemorrhage.  No mass lesions.

The bony calvarium is intact.  The visualized paranasal sinuses and
mastoid air cells are clear except for a small left mastoid
effusion inferiorly.
IMPRESSION: No acute intracranial findings or mass lesions.

## 2013-12-06 ENCOUNTER — Encounter: Payer: Self-pay | Admitting: Cardiology

## 2013-12-06 ENCOUNTER — Ambulatory Visit (INDEPENDENT_AMBULATORY_CARE_PROVIDER_SITE_OTHER): Payer: 59 | Admitting: Cardiology

## 2013-12-06 VITALS — BP 150/94 | HR 78 | Ht 65.0 in | Wt 200.0 lb

## 2013-12-06 DIAGNOSIS — G4733 Obstructive sleep apnea (adult) (pediatric): Secondary | ICD-10-CM | POA: Insufficient documentation

## 2013-12-06 DIAGNOSIS — G4730 Sleep apnea, unspecified: Secondary | ICD-10-CM

## 2013-12-06 DIAGNOSIS — I10 Essential (primary) hypertension: Secondary | ICD-10-CM

## 2013-12-06 NOTE — Patient Instructions (Signed)
Your physician recommends that you continue on your current medications as directed. Please refer to the Current Medication list given to you today.   Your physician wants you to follow-up in: ONE YEAR WITH DR NELSON You will receive a reminder letter in the mail two months in advance. If you Raine't receive a letter, please call our office to schedule the follow-up appointment.  

## 2013-12-06 NOTE — Progress Notes (Signed)
Patient ID: Mike Parker Sharpe, male   DOB: January 16, 1951, 63 y.o.   MRN: [id number]    Patient Name: Mike Parker Cumbee Date of Encounter: 12/06/2013  Primary Care Provider:  Margaree MackintoshBAXLEY,MARY J, MD Primary Cardiologist:  Lars MassonNELSON, Redmond Whittley H  Problem List   Past Medical History  Diagnosis Date  . Hypertension   . Allergy   . Diabetes mellitus   . Insomnia    Past Surgical History  Procedure Laterality Date  . Hernia repair      left inguinal  . Knee surgery      rt and left  . Elbow surgery      rt elbow   Allergies  No Known Allergies  HPI  HPI 63 year old male with long-standing history of hypertension and type 2 diabetes mellitus. He had a left thalamic stroke June 2014 with almost complete recovery. His blood pressures been elevated since that time surrounding the stroke.  He is on multiple BP medication regimen, recently his Atenolol was changed to Bystolic (nebivolol), lasix and amlodipine were added. He continues having elevated BP. He has been feeling fatigued since the last Friday and he attributes it to his hypertension. He denies chest pain or shortness of breath. 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 No palpitations, orthopnea or PND.  He is coming after 3 months with BP diary, all BP in 150-160 mmHg. He otherwise has no complains, denies DOE, CP, dizziness, palpitations or LE edema, but feels stressed about his BP readings.  12/06/2013 - patient is coming after 6 months we added clonidine at the last visit and increased amlodipine. He states that his blood pressures are in 130'/80' range at home. 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 He is no lower extremity edema. The only problem he refers to is that he wakes up several times at night.  Home Medications  Prior to Admission medications   Medication Sig Start Date End Date Taking? Authorizing Provider    amLODipine (NORVASC) 5 MG tablet TAKE 1 TABLET DAILY 07/11/12  Yes Margaree MackintoshMary J Baxley, MD  aspirin EC 81 MG EC tablet Take 1 tablet (81 mg total) by mouth daily. 07/15/12  Yes Nishant Dhungel, MD  atorvastatin (LIPITOR) 10 MG tablet  07/24/12  Yes Historical Provider, MD  fluticasone (FLONASE) 50 MCG/ACT nasal spray Place 2 sprays into both nostrils daily. 01/05/13  Yes Margaree MackintoshMary J Baxley, MD  FREESTYLE LITE test strip  11/23/11  Yes Historical Provider, MD  furosemide (LASIX) 40 MG tablet Take 1 tablet (40 mg total) by mouth daily. 09/14/12  Yes Margaree MackintoshMary J Baxley, MD  glipiZIDE (GLUCOTROL XL) 10 MG 24 hr tablet Take 1 tablet (10 mg total) by mouth daily. 03/20/12 03/20/13 Yes Margaree MackintoshMary J Baxley, MD  JANUMET 50-500 MG per tablet Take 2 tablets by mouth 2 (two) times daily with a meal. 07/15/12  Yes Nishant Dhungel, MD  Lancets (FREESTYLE) lancets  11/23/11  Yes Historical Provider, MD  LORazepam (ATIVAN) 1 MG tablet  07/24/12  Yes Historical Provider, MD  losartan (COZAAR) 100 MG tablet Take 1 tablet (100 mg total) by mouth daily. 09/14/12  Yes Margaree MackintoshMary J Baxley, MD  nebivolol (BYSTOLIC) 10 MG tablet Take 1 tablet (10 mg total) by mouth daily. 01/05/13  Yes Margaree MackintoshMary J Baxley, MD    Family History  Family History  Problem Relation Age of Onset  . Stroke Mother   .  Hypertension Brother     Social History  History   Social History  . Marital Status: Married    Spouse Name: N/A    Number of Children: 2  . 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 Smoking status: Never Smoker   . Smokeless tobacco: Never Used  . Alcohol Use: No  . Drug Use: No  . Sexual Activity: Yes   Other Topics Concern  . Not on file   Social History Narrative     Review of Systems, as per HPI, otherwise negative General:  No chills, fever, night sweats or weight changes.  Cardiovascular:  No chest pain, dyspnea on exertion, edema, orthopnea, palpitations, paroxysmal nocturnal  dyspnea. Dermatological: No rash, lesions/masses Respiratory: No cough, dyspnea Urologic: No hematuria, dysuria Abdominal:   No nausea, vomiting, diarrhea, bright red blood per rectum, melena, or hematemesis Neurologic:  No visual changes, wkns, changes in mental status. All other systems reviewed and are otherwise negative except as noted above.  Physical Exam  Blood pressure 150/94, pulse 78, height 5\' 5"  (1.651 m), weight 200 lb (90.719 kg).  General: Pleasant, NAD Psych: Normal affect. Neuro: Alert and oriented X 3. Moves all extremities spontaneously. HEENT: Normal  Neck: Supple without bruits or JVD. Lungs:  Resp regular and unlabored, CTA. Heart: RRR no s3, s4, or murmurs. Abdomen: Soft, non-tender, non-distended, BS + x 4.  Extremities: No clubbing, cyanosis or edema. DP/PT/Radials 2+ and equal bilaterally.  Labs:  No results for input(s): CKTOTAL, CKMB, TROPONINI in the last 72 hours. Lab Results  Component Value Date   WBC 5.6 08/30/2013   HGB 14.4 08/30/2013   HCT 43.7 08/30/2013   MCV 78.6 08/30/2013   PLT 219 08/30/2013   No results for input(s): NA, Parker, CL, CO2, BUN, CREATININE, CALCIUM, PROT, BILITOT, ALKPHOS, ALT, AST, GLUCOSE in the last 168 hours.  Invalid input(s): LABALBU Lab Results  Component Value Date   CHOL 111 08/30/2013   HDL 37* 08/30/2013   LDLCALC 58 08/30/2013   TRIG 80 08/30/2013   Accessory Clinical Findings  ECG - SR, normal ECG  Echocardiogram 07/11/2012  - Left ventricle: The cavity size was normal. There was moderate concentric hypertrophy. Systolic function was normal. The estimated ejection fraction was in the range of 60% to 65%. Wall motion was normal; there were no regional wall motion abnormalities. - Left atrium: The atrium was mildly dilated.   Assessment & Plan  A pleasant 63 year old male with h/o NIDDM and thalamic stroke in June 2014  1. Resistant hypertension - with h/o stroke and an evidence of moderate LVH  with diastolic dysfunction on echocardiogram. His BP is controlled after increasing his amlodipine to 10 mg po daily, and clonidine.A renal arterial duplex was negative for stenosis, both kidneys are normal size. He was advised to take amlodipine in the evening and all other meds in the am to cover night and early morning hours better. He is also encourage to start walking for 30 minutes 3x/week that should also help with obesity.He is motivated to do so.  2. Hyperlipidemia - all of his lipids at goal in August 2015  3. DM - HbA1c 6.9%. Follow up in 2 weeks with BP diary (mornings and evenings).   4. Sleep apnea - suspicion for, we will refer to sleep study.   Follow up in 1 year.    Lars MassonNELSON, Cheskel Silverio H, MD, [facility]  12/06/2013,  8:56 AM

## 2013-12-27 ENCOUNTER — Other Ambulatory Visit: Payer: Self-pay | Admitting: Internal Medicine

## 2014-02-08 ENCOUNTER — Other Ambulatory Visit: Payer: Self-pay | Admitting: Cardiology

## 2014-02-25 ENCOUNTER — Encounter: Payer: Self-pay | Admitting: Pulmonary Disease

## 2014-02-25 ENCOUNTER — Ambulatory Visit (INDEPENDENT_AMBULATORY_CARE_PROVIDER_SITE_OTHER): Payer: 59 | Admitting: Pulmonary Disease

## 2014-02-25 VITALS — BP 144/86 | HR 70 | Temp 97.0°F | Ht 65.5 in | Wt 203.6 lb

## 2014-02-25 DIAGNOSIS — R0683 Snoring: Secondary | ICD-10-CM

## 2014-02-25 NOTE — Patient Instructions (Signed)
Will set up for a sleep study, and arrange followup once the results are available.

## 2014-02-25 NOTE — Assessment & Plan Note (Signed)
The patient continues to have snoring, and now is having frequent awakenings at night with some delay at getting back to sleep. It is unclear if this is related to sleep apnea, or some other sleep disturbance. It may simply represent insomnia, which would require treatment with behavioral therapy. At this point, I think he needs to have a sleep study for an accurate diagnosis, and the patient is agreeable to this approach.

## 2014-02-25 NOTE — Progress Notes (Signed)
   Subjective:    Patient ID: Mike Parker, male    DOB: [birth year], 64 y.o.   MRN: [id number]  HPI The patient comes in today for ongoing issues with sleep. At the last visit, he was felt to have snoring, but it was unclear whether he had clinically significant sleep disordered breathing. The patient did not feel unrested, and denied having any daytime sleepiness issues. He comes back today where he is having frequent awakenings at night, and may take him 30-45 minutes to get back to sleep. He is unsure why this is occurring.   Review of Systems  Constitutional: Negative for fever and unexpected weight change.  HENT: Negative for congestion, dental problem, ear pain, nosebleeds, postnasal drip, rhinorrhea, sinus pressure, sneezing, sore throat and trouble swallowing.   Eyes: Negative for redness and itching.  Respiratory: Negative for cough, chest tightness, shortness of breath and wheezing.   Cardiovascular: Negative for palpitations and leg swelling.  Gastrointestinal: Negative for nausea and vomiting.  Genitourinary: Negative for dysuria.  Musculoskeletal: Negative for joint swelling.  Skin: Negative for rash.  Neurological: Negative for headaches.  Hematological: Does not bruise/bleed easily.  Psychiatric/Behavioral: Negative for dysphoric mood. The patient is not nervous/anxious.        Objective:   Physical Exam Overweight male in no acute distress Nose without purulence or discharge noted Neck without lymphadenopathy or thyromegaly Lower extremities with minimal edema, no cyanosis Alert and oriented, moves all 4 extremities.       Assessment & Plan:

## 2014-03-08 ENCOUNTER — Other Ambulatory Visit: Payer: PRIVATE HEALTH INSURANCE | Admitting: Internal Medicine

## 2014-03-08 DIAGNOSIS — E119 Type 2 diabetes mellitus without complications: Secondary | ICD-10-CM

## 2014-03-08 DIAGNOSIS — Z13220 Encounter for screening for lipoid disorders: Secondary | ICD-10-CM

## 2014-03-08 DIAGNOSIS — Z79899 Other long term (current) drug therapy: Secondary | ICD-10-CM

## 2014-03-08 LAB — HEMOGLOBIN A1C
HEMOGLOBIN A1C: 7.3 % — AB (ref <5.7)
Mean Plasma Glucose: 163 mg/dL — ABNORMAL HIGH (ref <117)

## 2014-03-08 LAB — LIPID PANEL
Cholesterol: 64 mg/dL (ref 0–200)
HDL: 36 mg/dL — AB (ref >39)
LDL Cholesterol: 17 mg/dL (ref 0–99)
TRIGLYCERIDES: 56 mg/dL (ref <150)
Total CHOL/HDL Ratio: 1.8 Ratio
VLDL: 11 mg/dL (ref 0–40)

## 2014-03-08 LAB — HEPATIC FUNCTION PANEL
ALBUMIN: 4.2 g/dL (ref 3.5–5.2)
ALK PHOS: 59 U/L (ref 39–117)
ALT: 24 U/L (ref 0–53)
AST: 16 U/L (ref 0–37)
BILIRUBIN DIRECT: 0.3 mg/dL (ref 0.0–0.3)
BILIRUBIN TOTAL: 0.9 mg/dL (ref 0.2–1.2)
Indirect Bilirubin: 0.6 mg/dL (ref 0.2–1.2)
Total Protein: 6.8 g/dL (ref 6.0–8.3)

## 2014-03-10 ENCOUNTER — Ambulatory Visit (INDEPENDENT_AMBULATORY_CARE_PROVIDER_SITE_OTHER): Payer: PRIVATE HEALTH INSURANCE | Admitting: Internal Medicine

## 2014-03-10 ENCOUNTER — Encounter: Payer: Self-pay | Admitting: Internal Medicine

## 2014-03-10 VITALS — BP 134/78 | HR 72 | Temp 97.8°F | Wt 200.0 lb

## 2014-03-10 DIAGNOSIS — G4730 Sleep apnea, unspecified: Secondary | ICD-10-CM

## 2014-03-10 DIAGNOSIS — Z8673 Personal history of transient ischemic attack (TIA), and cerebral infarction without residual deficits: Secondary | ICD-10-CM

## 2014-03-10 DIAGNOSIS — I10 Essential (primary) hypertension: Secondary | ICD-10-CM

## 2014-03-10 DIAGNOSIS — Z23 Encounter for immunization: Secondary | ICD-10-CM

## 2014-03-10 DIAGNOSIS — E119 Type 2 diabetes mellitus without complications: Secondary | ICD-10-CM

## 2014-03-10 MED ORDER — ECONAZOLE NITRATE 1 % EX CREA: TOPICAL_CREAM | Freq: Every day | CUTANEOUS | Status: DC

## 2014-03-10 MED ORDER — EFINACONAZOLE 10 % EX SOLN: CUTANEOUS | Status: DC

## 2014-03-10 NOTE — Progress Notes (Signed)
   Subjective:    Patient ID: Mike Parker, male    DOB: 09-02-1950, 64 y.o.   MRN: [id number]  HPI In  today for six-month recheck. Has not had diabetic eye exam. Prevnar given today. Says he's been eating too much because daughter was home for the Christmas holidays. Hemoglobin A1c has increased to 7.3%. However his lipid panel and liver functions are entirely normal. 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 Says his right arm still has paresthesias but has full strength.    Review of Systems     Objective:   Physical Exam  Skin warm and dry. Nodes none. Neck is supple without JVD thyromegaly or carotid bruits. Chest clear to auscultation. Cardiac exam regular rate and rhythm normal S1 and S2. Extremities without edema. Neurological exam is within normal limits with no evidence of weakness in right upper extremity which was affected by stroke  Weight 200 pounds    Assessment & Plan:  Tinea pedis  Onychomycosis:  both great toe nails  Essential hypertension  History of stroke  Controlled type 2 diabetes with elevation of A1c from 6.7-7.3%   Plan: Return in 6 weeks. Work on diet and exercise. Spectazole cream between toes daily. Stop Tinactin. See if Margette FastJubilee a can be approved with insurance company for daily use for toenail fungus. Check Accu-Cheks before breakfast and before supper.  25 minutes spent with patient

## 2014-03-10 NOTE — Patient Instructions (Addendum)
Use Spectazole cream between toes daily. Have diabetic eye exam. Watch diet and exercise. Return in 6 weeks. Try Jublia for toenail fungus if we can get it approved with insurance

## 2014-03-11 LAB — MICROALBUMIN / CREATININE URINE RATIO
Creatinine, Urine: 54.7 mg/dL
MICROALB UR: 0.6 mg/dL (ref <2.0)
MICROALB/CREAT RATIO: 11 mg/g (ref 0.0–30.0)

## 2014-03-22 ENCOUNTER — Other Ambulatory Visit: Payer: Self-pay | Admitting: Internal Medicine

## 2014-03-23 ENCOUNTER — Telehealth: Payer: Self-pay | Admitting: *Deleted

## 2014-03-23 DIAGNOSIS — B351 Tinea unguium: Secondary | ICD-10-CM

## 2014-03-30 NOTE — Telephone Encounter (Signed)
[facility] at [facility]40[facility] patient to arrive at 2:20 . Appt with Dr Doreen BeamWhitworth for his toe fungus. Unable to reach patient at this time will continue to try and contact patient.

## 2014-03-31 NOTE — Telephone Encounter (Signed)
Patient notified regarding Dermatology appt.

## 2014-04-08 ENCOUNTER — Other Ambulatory Visit: Payer: Self-pay | Admitting: Internal Medicine

## 2014-05-05 ENCOUNTER — Ambulatory Visit (HOSPITAL_BASED_OUTPATIENT_CLINIC_OR_DEPARTMENT_OTHER): Payer: 59 | Attending: Pulmonary Disease | Admitting: *Deleted

## 2014-05-05 VITALS — Ht 65.0 in | Wt 198.0 lb

## 2014-05-05 DIAGNOSIS — G4710 Hypersomnia, unspecified: Secondary | ICD-10-CM | POA: Diagnosis present

## 2014-05-05 DIAGNOSIS — G4733 Obstructive sleep apnea (adult) (pediatric): Secondary | ICD-10-CM | POA: Diagnosis not present

## 2014-05-05 DIAGNOSIS — R0683 Snoring: Secondary | ICD-10-CM

## 2014-05-16 ENCOUNTER — Other Ambulatory Visit: Payer: Self-pay | Admitting: Internal Medicine

## 2014-05-16 NOTE — Telephone Encounter (Signed)
Refill x 6 months 

## 2014-05-19 ENCOUNTER — Telehealth: Payer: Self-pay | Admitting: Pulmonary Disease

## 2014-05-19 DIAGNOSIS — G4733 Obstructive sleep apnea (adult) (pediatric): Secondary | ICD-10-CM | POA: Diagnosis not present

## 2014-05-19 NOTE — Telephone Encounter (Signed)
Pt needs ov to review sleep study 

## 2014-05-19 NOTE — Sleep Study (Signed)
   NAME: Mike LeitzDon K Parker DATE OF BIRTH:  August 29, 1950 MEDICAL RECORD NUMBER [id number]  LOCATION: [facility]  PHYSICIAN: Barbaraann ShareCLANCE,KEITH M  DATE OF STUDY: 05/05/2014  SLEEP STUDY TYPE: Nocturnal Polysomnogram               REFERRING PHYSICIAN: Clance, Maree KrabbeKeith M, MD  INDICATION FOR STUDY: Hypersomnia with sleep apnea  EPWORTH SLEEPINESS SCORE:  4 HEIGHT: 5\' 5"  (165.1 cm)  WEIGHT: 198 lb (89.812 kg)    Body mass index is 32.95 kg/(m^2).  NECK SIZE: 17 in.  MEDICATIONS: Reviewed in the sleep record  SLEEP ARCHITECTURE: The patient had a total sleep time of 316 minutes, with no slow-wave sleep and only 44 minutes of REM. Sleep onset latency was normal at 12 minutes, and REM onset was normal as well. Sleep efficiency was only mildly reduced at 85%.  RESPIRATORY DATA: The patient was found to have 9 central apneas and 47 obstructive hypopneas, giving him an AHI of 11 events an hour. The events occurred primarily in the supine position, and there was moderate snoring noted throughout.  OXYGEN DATA: There was transient oxygen desaturation as low as 85% with the patient's obstructive events  CARDIAC DATA: Occasional PVC noted, but no clinically significant arrhythmias were seen  MOVEMENT/PARASOMNIA: No periodic limb movements or other abnormal behaviors were noted.  IMPRESSION/ RECOMMENDATION:    1) mild obstructive sleep apnea/hypopnea syndrome, with an AHI of 11 events per hour and oxygen desaturation as low as 85%. Treatment for this degree of sleep apnea includes a trial of weight loss alone, upper airway surgery, dental appliance, and also C Pap. Clinical correlation is suggested.    Barbaraann ShareLANCE,KEITH M Diplomate, American Board of Sleep Medicine  ELECTRONICALLY SIGNED ON:  05/19/2014, 1:15 PM  [facility] PH: [phone]   FX: [phone]) [phone] ACCREDITED BY THE AMERICAN ACADEMY OF SLEEP MEDICINE

## 2014-05-20 NOTE — Telephone Encounter (Signed)
ATC pt home # line busy Called cell # and VM not set up. WCB

## 2014-05-24 NOTE — Telephone Encounter (Signed)
Mike Parker KC just want next open slot for patient to review sleep study?

## 2014-05-24 NOTE — Telephone Encounter (Signed)
Called pt and appt scheduled for tomorrow 

## 2014-05-24 NOTE — Telephone Encounter (Signed)
Pt returned call  [phone]

## 2014-05-24 NOTE — Telephone Encounter (Signed)
ATC pt home line rings busy Called cell # and [facility] set up [facility]

## 2014-05-25 ENCOUNTER — Ambulatory Visit (INDEPENDENT_AMBULATORY_CARE_PROVIDER_SITE_OTHER): Payer: 59 | Admitting: Pulmonary Disease

## 2014-05-25 ENCOUNTER — Encounter: Payer: Self-pay | Admitting: Pulmonary Disease

## 2014-05-25 VITALS — BP 122/70 | HR 65 | Temp 98.6°F | Ht 65.0 in | Wt 205.6 lb

## 2014-05-25 DIAGNOSIS — G4733 Obstructive sleep apnea (adult) (pediatric): Secondary | ICD-10-CM

## 2014-05-25 NOTE — Progress Notes (Signed)
   Subjective:    Patient ID: Mike Parker, male    DOB: Jan 20, 1951, 64 y.o.   MRN: [id number]  HPI The patient comes in today for follow-up of his recent sleep study. He was found to have mild OSA, with an AHI of 11 events per hour. The events occurred primarily in the supine position. I have reviewed the study with him in detail, and answered all of his questions.   Review of Systems  Constitutional: Negative for fever and unexpected weight change.  HENT: Negative for congestion, dental problem, ear pain, nosebleeds, postnasal drip, rhinorrhea, sinus pressure, sneezing, sore throat and trouble swallowing.   Eyes: Negative for redness and itching.  Respiratory: Negative for cough, chest tightness, shortness of breath and wheezing.   Cardiovascular: Negative for palpitations and leg swelling.  Gastrointestinal: Negative for nausea and vomiting.  Genitourinary: Negative for dysuria.  Musculoskeletal: Negative for joint swelling.  Skin: Negative for rash.  Neurological: Negative for headaches.  Hematological: Does not bruise/bleed easily.  Psychiatric/Behavioral: Negative for dysphoric mood. The patient is not nervous/anxious.        Objective:   Physical Exam Overweight male in no acute distress Nose without purulence or discharge noted Neck without lymphadenopathy or thyromegaly Lower extremities with minimal edema, no cyanosis Alert and oriented, moves all 4 extremities.       Assessment & Plan:

## 2014-05-25 NOTE — Patient Instructions (Signed)
Will give you a trial of cpap to see if this helps with your sleep and frequent awakenings Work on weight loss followup again in 8 weeks, but call if having issues with tolerance.

## 2014-05-25 NOTE — Assessment & Plan Note (Signed)
The patient has mild obstructive sleep apnea by his recent sleep study, but it is unclear whether this is the issue causing his frequent awakenings and disrupted sleep. He also has issues with chronic insomnia that may also be the problem. At this point, I have offered him various treatment options for him to consider. The conservative approach would be a trial of weight loss for the next 6 months to see if things improve. The more aggressive approach would consist of either a dental appliance or C Pap treatment while working on weight loss. I would prefer that he try C Pap first, since it is rented on a monthly basis for a period of time before it is purchased. This would allow him to see if this is going to help his sleep without a big financial investment. The patient is willing to do this.

## 2014-05-31 ENCOUNTER — Encounter: Payer: Self-pay | Admitting: Family Medicine

## 2014-05-31 ENCOUNTER — Ambulatory Visit (INDEPENDENT_AMBULATORY_CARE_PROVIDER_SITE_OTHER): Payer: PRIVATE HEALTH INSURANCE | Admitting: Family Medicine

## 2014-05-31 VITALS — BP 138/84 | HR 74 | Temp 98.2°F | Resp 16 | Ht 64.5 in | Wt 205.6 lb

## 2014-05-31 DIAGNOSIS — M79645 Pain in left finger(s): Secondary | ICD-10-CM | POA: Diagnosis not present

## 2014-05-31 DIAGNOSIS — IMO0001 Reserved for inherently not codable concepts without codable children: Secondary | ICD-10-CM

## 2014-05-31 DIAGNOSIS — E119 Type 2 diabetes mellitus without complications: Secondary | ICD-10-CM | POA: Diagnosis not present

## 2014-05-31 DIAGNOSIS — L03012 Cellulitis of left finger: Secondary | ICD-10-CM

## 2014-05-31 MED ORDER — DOXYCYCLINE HYCLATE 100 MG PO TABS: 100.0000 mg | ORAL_TABLET | Freq: Two times a day (BID) | ORAL | Status: DC

## 2014-05-31 NOTE — Patient Instructions (Addendum)
Warm compresses over area tonight and tomorrow morning- can take off bandage and allow to drain, then new clean bandage. If stable - recheck with Dr. Neva Seat tomorrow at 4pm.  If any increased pain or swelling - return right away in the morning as you may need to see a hand surgeon to have other procedure.  Return to the clinic or go to the nearest emergency room if any of your symptoms worsen or new symptoms occur.   Paronychia Paronychia is an inflammatory reaction involving the folds of the skin surrounding the fingernail. This is commonly caused by an infection in the skin around a nail. The most common cause of paronychia is frequent wetting of the hands (as seen with bartenders, food servers, nurses or others who wet their hands). This makes the skin around the fingernail susceptible to infection by bacteria (germs) or fungus. Other predisposing factors are:  Aggressive manicuring.  Nail biting.  Thumb sucking. The most common cause is a staphylococcal (a type of germ) infection, or a fungal (Candida) infection. When caused by a germ, it usually comes on suddenly with redness, swelling, pus and is often painful. It may get under the nail and form an abscess (collection of pus), or form an abscess around the nail. If the nail itself is infected with a fungus, the treatment is usually prolonged and may require oral medicine for up to one year. Your caregiver will determine the length of time treatment is required. The paronychia caused by bacteria (germs) may largely be avoided by not pulling on hangnails or picking at cuticles. When the infection occurs at the tips of the finger it is called felon. When the cause of paronychia is from the herpes simplex virus (HSV) it is called herpetic whitlow. TREATMENT  When an abscess is present treatment is often incision and drainage. This means that the abscess must be cut open so the pus can get out. When this is done, the following home care instructions  should be followed. HOME CARE INSTRUCTIONS   It is important to keep the affected fingers very dry. Rubber or plastic gloves over cotton gloves should be used whenever the hand must be placed in water.  Keep wound clean, dry and dressed as suggested by your caregiver between warm soaks or warm compresses.  Soak in warm water for fifteen to twenty minutes three to four times per day for bacterial infections. Fungal infections are very difficult to treat, so often require treatment for long periods of time.  For bacterial (germ) infections take antibiotics (medicine which kill germs) as directed and finish the prescription, even if the problem appears to be solved before the medicine is gone.  Only take over-the-counter or prescription medicines for pain, discomfort, or fever as directed by your caregiver. SEEK IMMEDIATE MEDICAL CARE IF:  You have redness, swelling, or increasing pain in the wound.  You notice pus coming from the wound.  You have a fever.  You notice a bad smell coming from the wound or dressing. Document Released: 07/10/2000 Document Revised: 04/08/2011 Document Reviewed: 03/11/2008 [facility] Patient Information 2015 [place], [place]. This information is not intended to replace advice given to you by your health care provider. Make sure you discuss any questions you have with your health care provider.   Incision and Drainage Incision and drainage is a procedure in which a sac-like structure (cystic structure) is opened and drained. The area to be drained usually contains material such as pus, fluid, or blood.  LET YOUR  CAREGIVER KNOW ABOUT:   Allergies to medicine.  Medicines taken, including vitamins, herbs, eyedrops, over-the-counter medicines, and creams.  Use of steroids (by mouth or creams).  Previous problems with anesthetics or numbing medicines.  History of bleeding problems or blood clots.  Previous surgery.  Other health problems, including diabetes  and kidney problems.  Possibility of pregnancy, if this applies. RISKS AND COMPLICATIONS  Pain.  Bleeding.  Scarring.  Infection. BEFORE THE PROCEDURE  You may need to have an ultrasound or other imaging tests to see how large or deep your cystic structure is. Blood tests may also be used to determine if you have an infection or how severe the infection is. You may need to have a tetanus shot. PROCEDURE  The affected area is cleaned with a cleaning fluid. The cyst area will then be numbed with a medicine (local anesthetic). A small incision will be made in the cystic structure. A syringe or catheter may be used to drain the contents of the cystic structure, or the contents may be squeezed out. The area will then be flushed with a cleansing solution. After cleansing the area, it is often gently packed with a gauze or another wound dressing. Once it is packed, it will be covered with gauze and tape or some other type of wound dressing. AFTER THE PROCEDURE   Often, you will be allowed to go home right after the procedure.  You may be given antibiotic medicine to prevent or heal an infection.  If the area was packed with gauze or some other wound dressing, you will likely need to come back in 1 to 2 days to get it removed.  The area should heal in about 14 days. Document Released: 07/10/2000 Document Revised: 07/16/2011 Document Reviewed: 03/11/2011 [facility] Patient Information 2015 [employer], [place]. This information is not intended to replace advice given to you by your health care provider. Make sure you discuss any questions you have with your health care provider.

## 2014-05-31 NOTE — Progress Notes (Signed)
Subjective:  This chart was scribed for Meredith Staggers MD,  by Veverly Fells, at [facility].  This patient was seen in room 11 and the patient's care was started at 8:37 AM.    Patient ID: Mike Parker, male    DOB: [birth year], 64 y.o.   MRN: [id number]  HPI  HPI Comments: Mike Parker is a 64 y.o. male with a history or diabetes and hypertension presents to the [facility] complaining of left middle finger swelling/pain onset 1 day ago when he work up.  Patient denies any hangnails on his finger, any recent injuries and states that he has never had this complaint before. He has associated symptoms of  drainage from the area.  He has not put any creams or lotions on his finger recently.    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  He has a history of a CVA and OSA.    Diabetes: His last A1C was 7.3 and he is taking oral medications for his diabetes.  Patient has not checked his sugars lately.     Patient Active Problem List   Diagnosis Date Noted  . OSA (obstructive sleep apnea) 12/06/2013  . Essential hypertension, malignant 12/06/2013  . Essential hypertension 05/19/2013  . Snoring 02/15/2013  . Frozen shoulder 01/29/2013  . Numbness and tingling 07/14/2012  . CVA (cerebral infarction) 07/14/2012  . Obesity 01/02/2011  . Hypertension 06/29/2010  . Diabetes mellitus 06/29/2010   Past Medical History  Diagnosis Date  . Hypertension   . Allergy   . Diabetes mellitus   . Insomnia    Past Surgical History  Procedure Laterality Date  . Hernia repair      left inguinal  . Knee surgery      rt and left  . Elbow surgery      rt elbow   No Known Allergies Prior to Admission medications   Medication Sig Start Date End Date Taking? Authorizing Provider  amLODipine (NORVASC) 10 MG tablet TAKE 1 TABLET DAILY 12/27/13  Yes Margaree Mackintosh, MD  aspirin EC 81 MG EC tablet Take 1 tablet (81 mg  total) by mouth daily. 07/15/12  Yes Nishant Dhungel, MD  atorvastatin (LIPITOR) 10 MG tablet TAKE ONE TABLET BY MOUTH AT SUPPER FOR LIPID LOWERING 10/27/13  Yes Margaree Mackintosh, MD  BYSTOLIC 10 MG tablet TAKE 1 TABLET DAILY   Yes Margaree Mackintosh, MD  cloNIDine (CATAPRES) 0.1 MG tablet TAKE 1 TABLET TWICE A DAY 02/09/14  Yes Lars Masson, MD  fluticasone (FLONASE) 50 MCG/ACT nasal spray Place 2 sprays into both nostrils daily as needed. 01/05/13  Yes Margaree Mackintosh, MD  FREESTYLE LITE test strip  11/23/11  Yes Historical Provider, MD  furosemide (LASIX) 40 MG tablet TAKE 1 TABLET DAILY 03/22/14  Yes Margaree Mackintosh, MD  GLIPIZIDE XL 10 MG 24 hr tablet TAKE 1 TABLET DAILY 04/08/14  Yes Margaree Mackintosh, MD  JANUMET 50-500 MG per tablet TAKE 1 TABLET TWICE A DAY 09/13/13  Yes Margaree Mackintosh, MD  Lancets (FREESTYLE) lancets  11/23/11  Yes Historical Provider, MD  LORazepam (ATIVAN) 1 MG tablet TAKE ONE TABLET BY MOUTH AT BEDTIME AS NEEDED 05/16/14  Yes Margaree Mackintosh, MD  losartan (COZAAR) 100 MG tablet TAKE 1 TABLET DAILY 07/15/13  Yes Margaree Mackintosh, MD   History   Social History  . Marital Status:  Married    Spouse Name: N/A  . Number of Children: 2  . 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 Smoking status: Never Smoker   . Smokeless tobacco: Never Used  . Alcohol Use: No  . Drug Use: No  . Sexual Activity: Yes   Other Topics Concern  . Not on file   Social History Narrative     Review of Systems  Constitutional: Negative for fever and chills.  Eyes: Negative for redness.  Respiratory: Negative for cough and choking.   Gastrointestinal: Negative for nausea and vomiting.  Skin: Positive for wound.       Objective:   Physical Exam  Constitutional: He is oriented to person, place, and time. He appears well-developed and well-nourished. No distress.  HENT:  Head: Normocephalic and atraumatic.  Eyes: Conjunctivae and EOM are  normal.  Neck: Neck supple.  Cardiovascular: Normal rate.   Pulmonary/Chest: Effort normal. No respiratory distress.  Musculoskeletal: Normal range of motion.  On his left third phalanx, there is a small paronychia on the radial aspect/lateral nail fold with yellow white appearance under the skin.   There is a tight/swollen area laterally to the finger pad,  primarily to the radial aspect. He is non tender on the ulnar side of the finger pad.   Full range of motion and full strength of third finger with flexion and extension.     Neurological: He is alert and oriented to person, place, and time.  Skin: Skin is warm and dry.  Psychiatric: He has a normal mood and affect. His behavior is normal.  Nursing note and vitals reviewed.   Filed Vitals:   05/31/14 0824  BP: 138/84  Pulse: 74  Temp: 98.2 F (36.8 C)  TempSrc: Oral  Resp: 16  Height: 5' 4.5" (1.638 m)  Weight: 205 lb 9.6 oz (93.26 kg)       Assessment & Plan:   Mike LeitzDon K Parker is a 64 y.o. male Paronychia of third finger of left hand - Plan: Wound culture, doxycycline (VIBRA-TABS) 100 MG tablet  Finger pain, left - Plan: Wound culture, doxycycline (VIBRA-TABS) 100 MG tablet  Type 2 diabetes mellitus without complication   Underlying DM2, with paronychia, soft tissue swelling without apparent felon.   I and D per procedure note.   Doxycycline BID - SED,   Recheck in 24 hrs recommended, rtc precautions.   Meds ordered this encounter  Medications  . doxycycline (VIBRA-TABS) 100 MG tablet    Sig: Take 1 tablet (100 mg total) by mouth 2 (two) times daily.    Dispense:  20 tablet    Refill:  0   Patient Instructions  Warm compresses over area tonight and tomorrow morning- can take off bandage and allow to drain, then new clean bandage. If stable - recheck with Dr. Neva SeatGreene tomorrow at 4pm.  If any increased pain or swelling - return right away in the morning as you may need to see a hand surgeon to have other procedure.    Return to the clinic or go to the nearest emergency room if any of your symptoms worsen or new symptoms occur.   Paronychia Paronychia is an inflammatory reaction involving the folds of the skin surrounding the fingernail. This is commonly caused by an infection in the skin around a nail. The most common cause of paronychia is frequent wetting of the hands (as seen with bartenders, food servers,  nurses or others who wet their hands). This makes the skin around the fingernail susceptible to infection by bacteria (germs) or fungus. Other predisposing factors are:  Aggressive manicuring.  Nail biting.  Thumb sucking. The most common cause is a staphylococcal (a type of germ) infection, or a fungal (Candida) infection. When caused by a germ, it usually comes on suddenly with redness, swelling, pus and is often painful. It may get under the nail and form an abscess (collection of pus), or form an abscess around the nail. If the nail itself is infected with a fungus, the treatment is usually prolonged and may require oral medicine for up to one year. Your caregiver will determine the length of time treatment is required. The paronychia caused by bacteria (germs) may largely be avoided by not pulling on hangnails or picking at cuticles. When the infection occurs at the tips of the finger it is called felon. When the cause of paronychia is from the herpes simplex virus (HSV) it is called herpetic whitlow. TREATMENT  When an abscess is present treatment is often incision and drainage. This means that the abscess must be cut open so the pus can get out. When this is done, the following home care instructions should be followed. HOME CARE INSTRUCTIONS   It is important to keep the affected fingers very dry. Rubber or plastic gloves over cotton gloves should be used whenever the hand must be placed in water.  Keep wound clean, dry and dressed as suggested by your caregiver between warm soaks or warm  compresses.  Soak in warm water for fifteen to twenty minutes three to four times per day for bacterial infections. Fungal infections are very difficult to treat, so often require treatment for long periods of time.  For bacterial (germ) infections take antibiotics (medicine which kill germs) as directed and finish the prescription, even if the problem appears to be solved before the medicine is gone.  Only take over-the-counter or prescription medicines for pain, discomfort, or fever as directed by your caregiver. SEEK IMMEDIATE MEDICAL CARE IF:  You have redness, swelling, or increasing pain in the wound.  You notice pus coming from the wound.  You have a fever.  You notice a bad smell coming from the wound or dressing. Document Released: 07/10/2000 Document Revised: 04/08/2011 Document Reviewed: 03/11/2008 [facility] Patient Information 2015 [place], [place]. This information is not intended to replace advice given to you by your health care provider. Make sure you discuss any questions you have with your health care provider.   Incision and Drainage Incision and drainage is a procedure in which a sac-like structure (cystic structure) is opened and drained. The area to be drained usually contains material such as pus, fluid, or blood.  LET YOUR CAREGIVER KNOW ABOUT:   Allergies to medicine.  Medicines taken, including vitamins, herbs, eyedrops, over-the-counter medicines, and creams.  Use of steroids (by mouth or creams).  Previous problems with anesthetics or numbing medicines.  History of bleeding problems or blood clots.  Previous surgery.  Other health problems, including diabetes and kidney problems.  Possibility of pregnancy, if this applies. RISKS AND COMPLICATIONS  Pain.  Bleeding.  Scarring.  Infection. BEFORE THE PROCEDURE  You may need to have an ultrasound or other imaging tests to see how large or deep your cystic structure is. Blood tests may also be  used to determine if you have an infection or how severe the infection is. You may need to have a tetanus shot. PROCEDURE  The affected area is cleaned with a cleaning fluid. The cyst area will then be numbed with a medicine (local anesthetic). A small incision will be made in the cystic structure. A syringe or catheter may be used to drain the contents of the cystic structure, or the contents may be squeezed out. The area will then be flushed with a cleansing solution. After cleansing the area, it is often gently packed with a gauze or another wound dressing. Once it is packed, it will be covered with gauze and tape or some other type of wound dressing. AFTER THE PROCEDURE   Often, you will be allowed to go home right after the procedure.  You may be given antibiotic medicine to prevent or heal an infection.  If the area was packed with gauze or some other wound dressing, you will likely need to come back in 1 to 2 days to get it removed.  The area should heal in about 14 days. Document Released: 07/10/2000 Document Revised: 07/16/2011 Document Reviewed: 03/11/2011 [facility] Patient Information 2015 [place], [place]. This information is not intended to replace advice given to you by your health care provider. Make sure you discuss any questions you have with your health care provider.

## 2014-05-31 NOTE — Progress Notes (Signed)
PROCEDURE NOTE: I&D of Abscess Verbal consent obtained. Local anesthesia with 4cc of 2% lidocaine plain, finger block of left 2nd finger. Site cleansed with Betadine.  Incision of 1cm was made using a 11 blade, discharge of small loculations and serosanguinous fluid. Wound cavity was explored with forceps. Hemostasis achieved with pressure. Cleansed and dressed with pressure dressing. After care instructions reviewed.  Wallis BambergMario Agustina Witzke, PA-C [facility] [phone] 05/31/2014  9:41 AM

## 2014-06-01 ENCOUNTER — Ambulatory Visit (INDEPENDENT_AMBULATORY_CARE_PROVIDER_SITE_OTHER): Payer: PRIVATE HEALTH INSURANCE | Admitting: Family Medicine

## 2014-06-01 VITALS — BP 152/80 | HR 78 | Temp 98.2°F | Resp 16 | Wt 208.0 lb

## 2014-06-01 DIAGNOSIS — Z5189 Encounter for other specified aftercare: Secondary | ICD-10-CM

## 2014-06-01 NOTE — Progress Notes (Signed)
[facility] [address], [place] [address] [phone]  Date:  06/01/2014   Name:  Mike LeitzDon K Parker   DOB:  November 05, 1950   MRN:  [id number]  PCP:  Margaree MackintoshBAXLEY,MARY J, MD    Chief Complaint: Follow-up   History of Present Illness:  Mike LeitzDon K Parker is a 64 y.o. very pleasant male patient who presents with the following:  Seen here yesterday with left long finger paronychia.  He had an I and D of abscess on finger yesterday, not packed.  He feels like the area is better and is here today for follow-up as instructed   Patient Active Problem List   Diagnosis Date Noted  . OSA (obstructive sleep apnea) 12/06/2013  . Essential hypertension, malignant 12/06/2013  . Essential hypertension 05/19/2013  . Snoring 02/15/2013  . Frozen shoulder 01/29/2013  . Numbness and tingling 07/14/2012  . CVA (cerebral infarction) 07/14/2012  . Obesity 01/02/2011  . Hypertension 06/29/2010  . Diabetes mellitus 06/29/2010    Past Medical History  Diagnosis Date  . Hypertension   . Allergy   . Diabetes mellitus   . Insomnia     Past Surgical History  Procedure Laterality Date  . Hernia repair      left inguinal  . Knee surgery      rt and left  . Elbow surgery      rt elbow    History  Substance Use Topics  . Smoking status: Never Smoker   . Smokeless tobacco: Never Used  . Alcohol Use: No    Family History  Problem Relation Age of Onset  . Stroke Mother   . Hypertension Brother     No Known Allergies  Medication list has been reviewed and updated.  Current Outpatient Prescriptions on File Prior to Visit  Medication Sig Dispense Refill  . amLODipine (NORVASC) 10 MG tablet TAKE 1 TABLET DAILY 90 tablet 3  . aspirin EC 81 MG EC tablet Take 1 tablet (81 mg total) by mouth daily. 30 tablet 3  . atorvastatin (LIPITOR) 10 MG tablet TAKE ONE TABLET BY MOUTH AT SUPPER FOR LIPID LOWERING 90 tablet 3  . BYSTOLIC 10 MG tablet TAKE 1 TABLET DAILY 90 tablet 3  .  cloNIDine (CATAPRES) 0.1 MG tablet TAKE 1 TABLET TWICE A DAY 180 tablet 3  . doxycycline (VIBRA-TABS) 100 MG tablet Take 1 tablet (100 mg total) by mouth 2 (two) times daily. 20 tablet 0  . fluticasone (FLONASE) 50 MCG/ACT nasal spray Place 2 sprays into both nostrils daily as needed.    Marland Kitchen. FREESTYLE LITE test strip     . furosemide (LASIX) 40 MG tablet TAKE 1 TABLET DAILY 90 tablet 3  . GLIPIZIDE XL 10 MG 24 hr tablet TAKE 1 TABLET DAILY 90 tablet 3  . JANUMET 50-500 MG per tablet TAKE 1 TABLET TWICE A DAY 180 tablet 3  . Lancets (FREESTYLE) lancets     . LORazepam (ATIVAN) 1 MG tablet TAKE ONE TABLET BY MOUTH AT BEDTIME AS NEEDED 30 tablet 5  . losartan (COZAAR) 100 MG tablet TAKE 1 TABLET DAILY 90 tablet 3   No current facility-administered medications on file prior to visit.    Review of Systems:  As per HPI- otherwise negative.   Physical Examination: Filed Vitals:   06/01/14 1624  BP: 152/80  Pulse: 78  Temp: 98.2 F (36.8 C)  Resp: 16   Filed Vitals:   06/01/14 1624  Weight: 208 lb (94.348 kg)  Body mass index is 35.16 kg/(m^2). Ideal Body Weight:     GEN: WDWN, NAD, Non-toxic, Alert & Oriented x 3, obese, looks well HEENT: Atraumatic, Normocephalic.  Ears and Nose: No external deformity. EXTR: No clubbing/cyanosis/edema NEURO: Normal gait.  PSYCH: Normally interactive. Conversant. Not depressed or anxious appearing.  Calm demeanor.  Left long finger. Removed bandage to reveal neat incision on lateral aspect of distal finger.  No discharge, minimal redness, no swelling and no heat.  He has normal cap refill, sensation and strength of the finger.  Cleaned with saline and re-dressed with band-aid.     Assessment and Plan: Encounter for wound care  As above- his wound is doing very well. No pus from wound.  Dressed as above, he will follow-up if needed See patient instructions for more details.     Signed Abbe AmsterdamJessica Mahlia Fernando, MD

## 2014-06-01 NOTE — Patient Instructions (Signed)
Keep your wound covered with a band- aid or other small bandage until healed.  You can wash it with soap and water.  If any worsening or sign of infection please come back or call

## 2014-06-02 LAB — WOUND CULTURE
GRAM STAIN: NONE SEEN
Gram Stain: NONE SEEN
Gram Stain: NONE SEEN

## 2014-06-09 ENCOUNTER — Telehealth: Payer: Self-pay | Admitting: Internal Medicine

## 2014-06-09 DIAGNOSIS — G4733 Obstructive sleep apnea (adult) (pediatric): Secondary | ICD-10-CM

## 2014-06-09 NOTE — Telephone Encounter (Signed)
Per 05/25/14 OV:      Patient Instructions       Will give you a trial of cpap to see if this helps with your sleep and frequent awakenings Work on weight loss followup again in 8 weeks, but call if having issues with tolerance.  -   Called spoke with pt. He reports he has been on CPAP x 6 days. He does not feel like the pressure is enough. He wants it increased. Download placed in [facility] green folder. Please advise thanks

## 2014-06-10 NOTE — Telephone Encounter (Signed)
Let him know that his download shows that his apnea is well controlled. On current setting, but can increase his pressure for comfort. Change to 7-15 for auto setting.  Have him call if still having issues.

## 2014-06-13 NOTE — Telephone Encounter (Signed)
Attempted to contact pt. No answer, voicemail box has not been set up. Will try back.

## 2014-06-14 NOTE — Telephone Encounter (Signed)
ATC pt - VM has not been set up.  WCB 

## 2014-06-15 NOTE — Telephone Encounter (Signed)
atc pt, no vm.  Wcb.

## 2014-06-16 NOTE — Telephone Encounter (Signed)
Called and spoke to pt. Informed pt of the results and recs per [facility] Order placed. Pt verbalized understanding and denied any further questions or concerns at this time.

## 2014-06-23 ENCOUNTER — Other Ambulatory Visit: Payer: Self-pay | Admitting: Internal Medicine

## 2014-07-20 ENCOUNTER — Encounter: Payer: Self-pay | Admitting: Internal Medicine

## 2014-07-20 ENCOUNTER — Ambulatory Visit (INDEPENDENT_AMBULATORY_CARE_PROVIDER_SITE_OTHER): Payer: 59 | Admitting: Internal Medicine

## 2014-07-20 VITALS — BP 120/76 | HR 70 | Ht 65.0 in | Wt 204.0 lb

## 2014-07-20 DIAGNOSIS — G4733 Obstructive sleep apnea (adult) (pediatric): Secondary | ICD-10-CM | POA: Diagnosis not present

## 2014-07-20 DIAGNOSIS — E669 Obesity, unspecified: Secondary | ICD-10-CM

## 2014-07-20 NOTE — Assessment & Plan Note (Signed)
Weight loss would help with sleep apnea control and is encouraged.

## 2014-07-20 NOTE — Progress Notes (Signed)
   Subjective:    Patient ID: Mike Parker, male    DOB: [birth year], 64 y.o.   MRN: [id number]  HPI  05/25/14- Dr Shelle Iron The patient comes in today for follow-up of his recent sleep study. He was found to have mild OSA, with an AHI of 11 events per hour. The events occurred primarily in the supine position. I have reviewed the study with him in detail, and answered all of his questions. NPSG 05/18/14 AHI 11/ hr, mild OSA  07/20/14- 63 yoM transferring to my care for OSA, complicated by HBP, DM, obesity, hx CVA FOLLOWS FOR: Wears CPAP Auto 7-15 [employer] most every night; wakes up and mask is off of face. Nasal pillows mask. We discussed alternatives and comfort issues. He needs to improve hours of use but pressure range provides good control based on download. Educated again on medical interaction of CPAP with his health problems.  Review of Systems  Constitutional: Negative for fever and unexpected weight change.  HENT: Negative for congestion, dental problem, ear pain, nosebleeds, postnasal drip, rhinorrhea, sinus pressure, sneezing, sore throat and trouble swallowing.   Eyes: Negative for redness and itching.  Respiratory: Negative for cough, chest tightness, shortness of breath and wheezing.   Cardiovascular: Negative for palpitations and leg swelling.  Gastrointestinal: Negative for nausea and vomiting.  Genitourinary: Negative for dysuria.  Musculoskeletal: Negative for joint swelling.  Skin: Negative for rash.  Neurological: Negative for headaches.  Hematological: Does not bruise/bleed easily.  Psychiatric/Behavioral: Negative for dysphoric mood. The patient is not nervous/anxious.       Objective:   Physical Exam OBJ- Physical Exam General- Alert, Oriented, Affect-appropriate, Distress- none acute,               + overweight Skin- rash-none, lesions- none, excoriation- none Lymphadenopathy- none Head- atraumatic            Eyes- Gross vision intact, PERRLA, conjunctivae and  secretions clear            Ears- Hearing, canals-normal            Nose- Clear, no-Septal dev, mucus, polyps, erosion, perforation             Throat- Mallampati II-III , mucosa clear , drainage- none, tonsils- atrophic Neck- flexible , trachea midline, no stridor , thyroid nl, carotid no bruit Chest - symmetrical excursion , unlabored           Heart/CV- RRR , no murmur , no gallop  , no rub, nl s1 s2                           - JVD- none , edema- none, stasis changes- none, varices- none           Lung- clear to P&A, wheeze- none, cough- none , dullness-none, rub- none           Chest wall-  Abd-  Br/ Gen/ Rectal- Not done, not indicated Extrem- cyanosis- none, clubbing, none, atrophy- none, strength- nl Neuro- grossly intact to observation         Assessment & Plan:

## 2014-07-20 NOTE — Assessment & Plan Note (Signed)
CPAP auto 7-15/[employer] is providing adequate pressure control. We discussed medical interaction with his several health problems including hypertension, diabetes and his history of CVA. Seek motivation to use all night every night. We discussed an oral appliance as potential alternative if necessary.

## 2014-07-20 NOTE — Patient Instructions (Signed)
We can continue CPAP auto 7-10/ [employer]   Order- DME Christoper Allegra- continue CPAP auto, mask of choice, humidifier, supplies, dx OSA  You can talk with Christoper Allegra about trying a different mask, maybe a nasal mask, next time.  Our goal eventually is to be comfortable wearing CPAP any time you sleep, all night, every night. We want to keep the oxygen levels up for your brain and heart.  Please let us know if there are problems.

## 2014-08-01 ENCOUNTER — Other Ambulatory Visit: Payer: Self-pay | Admitting: Internal Medicine

## 2014-09-12 ENCOUNTER — Other Ambulatory Visit: Payer: PRIVATE HEALTH INSURANCE | Admitting: Internal Medicine

## 2014-09-12 DIAGNOSIS — E119 Type 2 diabetes mellitus without complications: Secondary | ICD-10-CM

## 2014-09-12 LAB — HEMOGLOBIN A1C
HEMOGLOBIN A1C: 7.5 % — AB (ref <5.7)
MEAN PLASMA GLUCOSE: 169 mg/dL — AB (ref <117)

## 2014-09-13 ENCOUNTER — Other Ambulatory Visit: Payer: PRIVATE HEALTH INSURANCE | Admitting: Internal Medicine

## 2014-09-15 ENCOUNTER — Ambulatory Visit (INDEPENDENT_AMBULATORY_CARE_PROVIDER_SITE_OTHER): Payer: PRIVATE HEALTH INSURANCE | Admitting: Internal Medicine

## 2014-09-15 ENCOUNTER — Encounter: Payer: Self-pay | Admitting: Internal Medicine

## 2014-09-15 VITALS — BP 138/90 | HR 76 | Temp 97.9°F | Wt 197.0 lb

## 2014-09-15 DIAGNOSIS — I10 Essential (primary) hypertension: Secondary | ICD-10-CM | POA: Diagnosis not present

## 2014-09-15 DIAGNOSIS — E669 Obesity, unspecified: Secondary | ICD-10-CM

## 2014-09-15 DIAGNOSIS — E119 Type 2 diabetes mellitus without complications: Secondary | ICD-10-CM | POA: Diagnosis not present

## 2014-09-15 DIAGNOSIS — Z8673 Personal history of transient ischemic attack (TIA), and cerebral infarction without residual deficits: Secondary | ICD-10-CM

## 2014-09-15 DIAGNOSIS — G4733 Obstructive sleep apnea (adult) (pediatric): Secondary | ICD-10-CM | POA: Diagnosis not present

## 2014-09-15 NOTE — Patient Instructions (Signed)
Return in December for physical examination and fasting lab work. Try to work on diet and exercise. Take lorazepam if needed for insomnia.  To have flu vaccine here in October. Needs to have diabetic eye exam. Needs Zostavax vaccine.

## 2014-09-15 NOTE — Progress Notes (Signed)
   Subjective:    Patient ID: Mike Parker, male    DOB: 06-Jan-1951, 64 y.o.   MRN: [id number]  HPI   His mother died in Aug 04, 2022. He had insomnia last night. Blood pressure is a bit elevated today. Wife is in [facility] [place] on a trip. His mother was 41 years old. He said she just gave up and quit eating but he doesn't know the cause of death. She was in hospice for 4 days. 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 His right hand still has paresthesias and that concerns him at times. Have offered to write him a note to get off the Route. 25 minutes speaking with him about grief reaction. He has lorazepam for insomnia but sometimes he doesn't take it. He didn't take it last night.  Reminded about diabetic eye exam. Hemoglobin A1c is elevated 7.5% but he admits he has not been watching diet.  History of CVA.    Review of Systems     Objective:   Physical Exam  Skin warm and dry. Nodes none. Neck is supple without JVD thyromegaly or carotid bruits. Chest clear to auscultation. Cardiac exam regular rate and rhythm normal S1 and S2. Extremities without edema.      Assessment & Plan:  Grief reaction  Controlled type 2 diabetes mellitus  Essential hypertension  History of CVA  Insomnia  Plan: Return in December for physical exam. Needs to have diabetic eye exam. To have flu vaccine in October. Needs Zostavax vaccine. Continue same medications and work on diet and exercise.

## 2014-09-22 ENCOUNTER — Other Ambulatory Visit: Payer: Self-pay | Admitting: Internal Medicine

## 2014-10-15 ENCOUNTER — Other Ambulatory Visit: Payer: Self-pay | Admitting: Internal Medicine

## 2014-11-09 ENCOUNTER — Ambulatory Visit (INDEPENDENT_AMBULATORY_CARE_PROVIDER_SITE_OTHER): Payer: PRIVATE HEALTH INSURANCE | Admitting: Internal Medicine

## 2014-11-09 VITALS — Temp 98.0°F

## 2014-11-09 DIAGNOSIS — Z23 Encounter for immunization: Secondary | ICD-10-CM

## 2014-11-22 ENCOUNTER — Ambulatory Visit: Payer: 59 | Admitting: Internal Medicine

## 2014-12-21 ENCOUNTER — Other Ambulatory Visit: Payer: Self-pay | Admitting: Internal Medicine

## 2015-01-05 ENCOUNTER — Other Ambulatory Visit: Payer: Self-pay | Admitting: Cardiology

## 2015-01-12 ENCOUNTER — Other Ambulatory Visit: Payer: PRIVATE HEALTH INSURANCE | Admitting: Internal Medicine

## 2015-01-13 ENCOUNTER — Other Ambulatory Visit: Payer: PRIVATE HEALTH INSURANCE | Admitting: Internal Medicine

## 2015-01-13 DIAGNOSIS — E785 Hyperlipidemia, unspecified: Secondary | ICD-10-CM

## 2015-01-13 DIAGNOSIS — Z0000 Encounter for general adult medical examination without abnormal findings: Secondary | ICD-10-CM

## 2015-01-13 DIAGNOSIS — Z79899 Other long term (current) drug therapy: Secondary | ICD-10-CM

## 2015-01-13 DIAGNOSIS — E8881 Metabolic syndrome: Secondary | ICD-10-CM

## 2015-01-13 DIAGNOSIS — E119 Type 2 diabetes mellitus without complications: Secondary | ICD-10-CM

## 2015-01-13 DIAGNOSIS — I10 Essential (primary) hypertension: Secondary | ICD-10-CM

## 2015-01-13 DIAGNOSIS — Z125 Encounter for screening for malignant neoplasm of prostate: Secondary | ICD-10-CM

## 2015-01-13 LAB — CBC WITH DIFFERENTIAL/PLATELET
Basophils Absolute: 0.1 10*3/uL (ref 0.0–0.1)
Basophils Relative: 1 % (ref 0–1)
EOS ABS: 0.2 10*3/uL (ref 0.0–0.7)
EOS PCT: 4 % (ref 0–5)
HCT: 43.5 % (ref 39.0–52.0)
Hemoglobin: 14.1 g/dL (ref 13.0–17.0)
LYMPHS ABS: 1.2 10*3/uL (ref 0.7–4.0)
Lymphocytes Relative: 20 % (ref 12–46)
MCH: 25.8 pg — AB (ref 26.0–34.0)
MCHC: 32.4 g/dL (ref 30.0–36.0)
MCV: 79.7 fL (ref 78.0–100.0)
MONO ABS: 0.5 10*3/uL (ref 0.1–1.0)
MONOS PCT: 9 % (ref 3–12)
MPV: 10 fL (ref 8.6–12.4)
Neutro Abs: 4 10*3/uL (ref 1.7–7.7)
Neutrophils Relative %: 66 % (ref 43–77)
PLATELETS: 226 10*3/uL (ref 150–400)
RBC: 5.46 MIL/uL (ref 4.22–5.81)
RDW: 15 % (ref 11.5–15.5)
WBC: 6 10*3/uL (ref 4.0–10.5)

## 2015-01-13 LAB — COMPLETE METABOLIC PANEL WITH GFR
ALT: 19 U/L (ref 9–46)
AST: 17 U/L (ref 10–35)
Albumin: 4.1 g/dL (ref 3.6–5.1)
Alkaline Phosphatase: 59 U/L (ref 40–115)
BILIRUBIN TOTAL: 1.2 mg/dL (ref 0.2–1.2)
BUN: 16 mg/dL (ref 7–25)
CALCIUM: 9.3 mg/dL (ref 8.6–10.3)
CHLORIDE: 103 mmol/L (ref 98–110)
CO2: 27 mmol/L (ref 20–31)
CREATININE: 1.14 mg/dL (ref 0.70–1.25)
GFR, EST AFRICAN AMERICAN: 79 mL/min (ref >=60)
GFR, Est Non African American: 68 mL/min (ref >=60)
Glucose, Bld: 123 mg/dL — ABNORMAL HIGH (ref 65–99)
Potassium: 4.2 mmol/L (ref 3.5–5.3)
Sodium: 139 mmol/L (ref 135–146)
TOTAL PROTEIN: 6.7 g/dL (ref 6.1–8.1)

## 2015-01-13 LAB — LIPID PANEL
CHOL/HDL RATIO: 1.9 ratio (ref <=5.0)
CHOLESTEROL: 74 mg/dL — AB (ref 125–200)
HDL: 39 mg/dL — ABNORMAL LOW (ref >=40)
LDL Cholesterol: 24 mg/dL (ref <130)
Triglycerides: 54 mg/dL (ref <150)
VLDL: 11 mg/dL (ref <30)

## 2015-01-14 LAB — HEMOGLOBIN A1C
HEMOGLOBIN A1C: 7 % — AB (ref <5.7)
Mean Plasma Glucose: 154 mg/dL — ABNORMAL HIGH (ref <117)

## 2015-01-14 LAB — PSA: PSA: 0.96 ng/mL (ref <=4.00)

## 2015-01-16 ENCOUNTER — Encounter: Payer: Self-pay | Admitting: Internal Medicine

## 2015-01-16 ENCOUNTER — Ambulatory Visit (INDEPENDENT_AMBULATORY_CARE_PROVIDER_SITE_OTHER): Payer: PRIVATE HEALTH INSURANCE | Admitting: Internal Medicine

## 2015-01-16 VITALS — BP 146/88 | HR 74 | Temp 98.2°F | Resp 20 | Ht 65.0 in | Wt 195.0 lb

## 2015-01-16 DIAGNOSIS — M25562 Pain in left knee: Secondary | ICD-10-CM

## 2015-01-16 DIAGNOSIS — E119 Type 2 diabetes mellitus without complications: Secondary | ICD-10-CM | POA: Diagnosis not present

## 2015-01-16 DIAGNOSIS — Z0000 Encounter for general adult medical examination without abnormal findings: Secondary | ICD-10-CM

## 2015-01-16 DIAGNOSIS — G4733 Obstructive sleep apnea (adult) (pediatric): Secondary | ICD-10-CM

## 2015-01-16 DIAGNOSIS — E8881 Metabolic syndrome: Secondary | ICD-10-CM

## 2015-01-16 DIAGNOSIS — R202 Paresthesia of skin: Secondary | ICD-10-CM | POA: Diagnosis not present

## 2015-01-16 DIAGNOSIS — Z8673 Personal history of transient ischemic attack (TIA), and cerebral infarction without residual deficits: Secondary | ICD-10-CM

## 2015-01-16 DIAGNOSIS — E785 Hyperlipidemia, unspecified: Secondary | ICD-10-CM | POA: Diagnosis not present

## 2015-01-16 DIAGNOSIS — I10 Essential (primary) hypertension: Secondary | ICD-10-CM

## 2015-01-16 LAB — POCT URINALYSIS DIPSTICK
Bilirubin, UA: NEGATIVE
Glucose, UA: NEGATIVE
Ketones, UA: NEGATIVE
Leukocytes, UA: NEGATIVE
NITRITE UA: NEGATIVE
PH UA: 6
PROTEIN UA: NEGATIVE
RBC UA: NEGATIVE
Spec Grav, UA: 1.01
UROBILINOGEN UA: 0.2

## 2015-01-16 NOTE — Patient Instructions (Addendum)
It was a pleasure to see you today. Congratulations on your retirement. Out of work for several days due to cold weather effects on right hand. Need colonoscopy in the near future, diabetic eye exam, Zostavax vaccine. Continue same medications and return in 6 months. Watch BP at home.

## 2015-01-27 ENCOUNTER — Other Ambulatory Visit: Payer: Self-pay | Admitting: Internal Medicine

## 2015-01-31 ENCOUNTER — Other Ambulatory Visit: Payer: Self-pay | Admitting: *Deleted

## 2015-01-31 MED ORDER — LORAZEPAM 1 MG PO TABS: 1.0000 mg | ORAL_TABLET | Freq: Every evening | ORAL | Status: DC | PRN

## 2015-01-31 NOTE — Telephone Encounter (Signed)
rx called into pharmacy

## 2015-02-15 ENCOUNTER — Ambulatory Visit (AMBULATORY_SURGERY_CENTER): Payer: Self-pay | Admitting: *Deleted

## 2015-02-15 VITALS — Ht 65.0 in | Wt 207.0 lb

## 2015-02-15 DIAGNOSIS — Z1211 Encounter for screening for malignant neoplasm of colon: Secondary | ICD-10-CM

## 2015-02-15 NOTE — Progress Notes (Signed)
Patient denies any allergies to eggs or soy. Patient denies any problems with anesthesia/sedation. Patient denies any oxygen use at home and does not take any diet/weight loss medications. EMMI education assisgned to patient on colonoscopy, this was explained and instructions given to patient. 

## 2015-02-21 ENCOUNTER — Ambulatory Visit (INDEPENDENT_AMBULATORY_CARE_PROVIDER_SITE_OTHER): Payer: PRIVATE HEALTH INSURANCE | Admitting: Physician Assistant

## 2015-02-21 VITALS — BP 156/84 | HR 71 | Temp 98.7°F | Resp 16 | Ht 65.5 in | Wt 201.0 lb

## 2015-02-21 DIAGNOSIS — R197 Diarrhea, unspecified: Secondary | ICD-10-CM

## 2015-02-21 DIAGNOSIS — I10 Essential (primary) hypertension: Secondary | ICD-10-CM | POA: Diagnosis not present

## 2015-02-21 DIAGNOSIS — R112 Nausea with vomiting, unspecified: Secondary | ICD-10-CM

## 2015-02-21 MED ORDER — ONDANSETRON 4 MG PO TBDP: 4.0000 mg | ORAL_TABLET | Freq: Three times a day (TID) | ORAL | Status: DC | PRN

## 2015-02-21 NOTE — Progress Notes (Signed)
Mike Parker  MRN: [id number] DOB: Apr 13, 1950  Subjective:  Pt presents to clinic with GI illness for the last 3 days - It started 4 days ago when he has an episode of dizziness which happens to him before he gets sick.  3 days ago he had nausea and vomiting and diarrhea all at the same time.  The vomiting stopped yesterday but the diarrhea has continued.  He has not been eating or taking his medications because he is afraid to throw up.  He states whenever he drinks he has immediate diarrhea.  He is having no abd cramping anymore with the diarrhea.  He has been around sick family members with similar problems.  Patient Active Problem List   Diagnosis Date Noted  . Metabolic syndrome 01/16/2015  . Left knee pain 01/16/2015  . Paresthesias in right hand 01/16/2015  . OSA (obstructive sleep apnea) 12/06/2013  . Essential hypertension, malignant 12/06/2013  . Essential hypertension 05/19/2013  . Numbness and tingling 07/14/2012  . CVA (cerebral infarction) 07/14/2012  . Obesity 01/02/2011  . Hypertension 06/29/2010  . Diabetes mellitus (HCC) 06/29/2010    Current Outpatient Prescriptions on File Prior to Visit  Medication Sig Dispense Refill  . amLODipine (NORVASC) 10 MG tablet TAKE 1 TABLET DAILY 90 tablet 3  . aspirin EC 81 MG EC tablet Take 1 tablet (81 mg total) by mouth daily. 30 tablet 3  . atorvastatin (LIPITOR) 10 MG tablet TAKE 1 TABLET AT SUPPER FOR LIPID LOWERING 90 tablet 2  . BYSTOLIC 10 MG tablet TAKE 1 TABLET DAILY 90 tablet 3  . cloNIDine (CATAPRES) 0.1 MG tablet Take 1 tablet (0.1 mg total) by mouth 2 (two) times daily. 180 tablet 0  . fluticasone (FLONASE) 50 MCG/ACT nasal spray Place 2 sprays into both nostrils daily as needed. Reported on 02/15/2015    . FREESTYLE LITE test strip     . furosemide (LASIX) 40 MG tablet TAKE 1 TABLET DAILY 90 tablet 3  . GLIPIZIDE XL 10 MG 24 hr tablet TAKE 1 TABLET DAILY 90 tablet 3  . JANUMET 50-500 MG per tablet TAKE 1 TABLET TWICE  A DAY 180 tablet 2  . Lancets (FREESTYLE) lancets     . LORazepam (ATIVAN) 1 MG tablet Take 1 tablet (1 mg total) by mouth at bedtime as needed. 30 tablet 5  . losartan (COZAAR) 100 MG tablet TAKE 1 TABLET DAILY 90 tablet 2  . bisacodyl (BISACODYL) 5 MG EC tablet Take 5 mg by mouth once. Reported on 02/21/2015    . polyethylene glycol powder (MIRALAX) powder Take 1 Container by mouth once. Reported on 02/21/2015     No current facility-administered medications on file prior to visit.    No Known Allergies  Review of Systems  Constitutional: Positive for chills.  HENT: Positive for congestion (nasal).   Gastrointestinal: Positive for nausea, vomiting (yesterday - last ) and diarrhea (water - >5x/day for the last 3 days).  Neurological: Positive for dizziness and headaches.   Objective:  BP 156/84 mmHg  Pulse 71  Temp(Src) 98.7 F (37.1 C)  Resp 16  Ht 5' 5.5" (1.664 m)  Wt 201 lb (91.173 kg)  BMI 32.93 kg/m2  SpO2 96%  Physical Exam  Constitutional: He is oriented to person, place, and time and well-developed, well-nourished, and in no distress.  HENT:  Head: Normocephalic and atraumatic.  Right Ear: External ear normal.  Left Ear: External ear normal.  Eyes: Conjunctivae are normal.  Neck: Normal range  of motion.  Cardiovascular: Normal rate, regular rhythm and normal heart sounds.   No murmur heard. Pulmonary/Chest: Effort normal and breath sounds normal.  Abdominal: Soft. Bowel sounds are normal.  Neurological: He is alert and oriented to person, place, and time. Gait normal.  Skin: Skin is warm and dry.  Psychiatric: Mood, memory, affect and judgment normal.   Orthostatic VS for the past 24 hrs:  BP- Lying Pulse- Lying BP- Sitting Pulse- Sitting BP- Standing at 0 minutes Pulse- Standing at 0 minutes  02/21/15 0905 (!) 163/91 mmHg 69 (!) 147/91 mmHg 73 146/90 mmHg 74      Assessment and Plan :  Diarrhea, unspecified type - Plan: Care order/instruction  Nausea  and vomiting, intractability of vomiting not specified, unspecified vomiting type - Plan: Orthostatic vital signs, ondansetron (ZOFRAN ODT) 4 MG disintegrating tablet  Essential hypertension elevated today - likely because of no medications - pt will start to monitor once he is back on his medications.    Symptomatic treatment for the patient.  Benny Lennert PA-C  [facility] 02/21/2015 9:20 AM

## 2015-02-21 NOTE — Patient Instructions (Addendum)
Start with ice chips and then sips of clear fluids.  Once you are able to tolerated drinking liquids you can start with bland foods such as rice, apple sauce and toast.  If you can tolerate this you can advance diet as tolerated.  Limit dairy for several days to reduce return of diarrhea if you have been experiencing diarrhea.  Try Imodium for the diarrhea - make sure you stop it before your stool goes back to normal.

## 2015-02-27 ENCOUNTER — Encounter: Payer: Self-pay | Admitting: Cardiology

## 2015-02-27 ENCOUNTER — Ambulatory Visit (INDEPENDENT_AMBULATORY_CARE_PROVIDER_SITE_OTHER): Payer: PRIVATE HEALTH INSURANCE | Admitting: Cardiology

## 2015-02-27 VITALS — BP 130/82 | HR 72 | Ht 65.5 in | Wt 205.0 lb

## 2015-02-27 DIAGNOSIS — Z8673 Personal history of transient ischemic attack (TIA), and cerebral infarction without residual deficits: Secondary | ICD-10-CM

## 2015-02-27 DIAGNOSIS — I119 Hypertensive heart disease without heart failure: Secondary | ICD-10-CM

## 2015-02-27 DIAGNOSIS — I10 Essential (primary) hypertension: Secondary | ICD-10-CM | POA: Diagnosis not present

## 2015-02-27 DIAGNOSIS — E785 Hyperlipidemia, unspecified: Secondary | ICD-10-CM | POA: Diagnosis not present

## 2015-02-27 NOTE — Patient Instructions (Signed)
**Note De-identified Mike Parker Obfuscation** Medication Instructions:  Same-no changes  Labwork: None  Testing/Procedures: None  Follow-Up: Your physician wants you to follow-up in: 1 year. You will receive a reminder letter in the mail two months in advance. If you Mike Parker't receive a letter, please call our office to schedule the follow-up appointment.      If you need a refill on your cardiac medications before your next appointment, please call your pharmacy.   

## 2015-02-27 NOTE — Progress Notes (Signed)
Patient ID: ASANTE BLANDA, male   DOB: 06-Jul-1950, 65 y.o.   MRN: [id number]    Patient Name: Mike Parker Date of Encounter: 02/27/2015  Primary Care Provider:  Margaree Mackintosh, MD Primary Cardiologist:  Lars Masson  Problem List   Past Medical History  Diagnosis Date  . Hypertension   . Allergy   . Diabetes mellitus   . Insomnia    Past Surgical History  Procedure Laterality Date  . Hernia repair      left inguinal  . Knee surgery      rt and left  . Elbow surgery      rt elbow   Allergies  No Known Allergies  HPI  HPI  65 year old male with long-standing history of hypertension and type 2 diabetes mellitus. He had a left thalamic stroke June 2014 with almost complete recovery. His blood pressures been elevated since that time surrounding the stroke.  He is on multiple BP medication regimen, recently his Atenolol was changed to Bystolic (nebivolol), lasix and amlodipine were added. He continues having elevated BP. He has been feeling fatigued since the last Friday and he attributes it to his hypertension. He denies chest pain or shortness of breath. 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 No palpitations, orthopnea or PND.  He is coming after 3 months with BP diary, all BP in 150-160 mmHg. He otherwise has no complains, denies DOE, CP, dizziness, palpitations or LE edema, but feels stressed about his BP readings.  12/06/2013 - patient is coming after 6 months we added clonidine at the last visit and increased amlodipine. He states that his blood pressures are in 130'/80' range at home. 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 He is no lower extremity edema. The only problem he refers to is that he wakes up several times at night.  02/27/2015- the patient is coming after one year. He's ready to get retired this Wednesday. He feels great active in  golfing, and denies any chest pain, shortness of breath, palpitations, lower extremity edema or syncope. He is compliant with his medications. He had labs checked in December of last year with all lipids at goal normal LFTs normal CBC and only elevated blood sugar.  Home Medications  Prior to Admission medications   Medication Sig Start Date End Date Taking? Authorizing Provider  amLODipine (NORVASC) 5 MG tablet TAKE 1 TABLET DAILY 07/11/12  Yes Margaree Mackintosh, MD  aspirin EC 81 MG EC tablet Take 1 tablet (81 mg total) by mouth daily. 07/15/12  Yes Nishant Dhungel, MD  atorvastatin (LIPITOR) 10 MG tablet  07/24/12  Yes Historical Provider, MD  fluticasone (FLONASE) 50 MCG/ACT nasal spray Place 2 sprays into both nostrils daily. 01/05/13  Yes Margaree Mackintosh, MD  FREESTYLE LITE test strip  11/23/11  Yes Historical Provider, MD  furosemide (LASIX) 40 MG tablet Take 1 tablet (40 mg total) by mouth daily. 09/14/12  Yes Margaree Mackintosh, MD  glipiZIDE (GLUCOTROL XL) 10 MG 24 hr tablet Take 1 tablet (10 mg total) by mouth daily. 03/20/12 03/20/13 Yes Margaree Mackintosh, MD  JANUMET 50-500 MG per tablet Take 2 tablets by mouth 2 (two) times daily with a meal. 07/15/12  Yes Nishant Dhungel, MD  Lancets (FREESTYLE) lancets  11/23/11  Yes Historical Provider, MD  LORazepam (ATIVAN) 1 MG tablet  07/24/12  Yes Historical  Provider, MD  losartan (COZAAR) 100 MG tablet Take 1 tablet (100 mg total) by mouth daily. 09/14/12  Yes Margaree Mackintosh, MD  nebivolol (BYSTOLIC) 10 MG tablet Take 1 tablet (10 mg total) by mouth daily. 01/05/13  Yes Margaree Mackintosh, MD    Family History  Family History  Problem Relation Age of Onset  . Stroke Mother   . Hypertension Brother   . Colon cancer Neg Hx     Social History  Social History   Social History  . Marital Status: Married    Spouse Name: N/A  . Number of Children: 2  . 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  Smoking status: Never Smoker   . Smokeless tobacco: Never Used  . Alcohol Use: No  . Drug Use: No  . Sexual Activity: Yes   Other Topics Concern  . Not on file   Social History Narrative     Review of Systems, as per HPI, otherwise negative General:  No chills, fever, night sweats or weight changes.  Cardiovascular:  No chest pain, dyspnea on exertion, edema, orthopnea, palpitations, paroxysmal nocturnal dyspnea. Dermatological: No rash, lesions/masses Respiratory: No cough, dyspnea Urologic: No hematuria, dysuria Abdominal:   No nausea, vomiting, diarrhea, bright red blood per rectum, melena, or hematemesis Neurologic:  No visual changes, wkns, changes in mental status. All other systems reviewed and are otherwise negative except as noted above.  Physical Exam  Blood pressure 130/82, pulse 72, height 5' 5.5" (1.664 m), weight 205 lb (92.987 kg).  General: Pleasant, NAD Psych: Normal affect. Neuro: Alert and oriented X 3. Moves all extremities spontaneously. HEENT: Normal  Neck: Supple without bruits or JVD. Lungs:  Resp regular and unlabored, CTA. Heart: RRR no s3, s4, or murmurs. Abdomen: Soft, non-tender, non-distended, BS + x 4.  Extremities: No clubbing, cyanosis or edema. DP/PT/Radials 2+ and equal bilaterally.  Labs:  No results for input(s): CKTOTAL, CKMB, TROPONINI in the last 72 hours. Lab Results  Component Value Date   WBC 6.0 01/13/2015   HGB 14.1 01/13/2015   HCT 43.5 01/13/2015   MCV 79.7 01/13/2015   PLT 226 01/13/2015   No results for input(s): NA, K, CL, CO2, BUN, CREATININE, CALCIUM, PROT, BILITOT, ALKPHOS, ALT, AST, GLUCOSE in the last 168 hours.  Invalid input(s): LABALBU Lab Results  Component Value Date   CHOL 74* 01/13/2015   HDL 39* 01/13/2015   LDLCALC 24 01/13/2015   TRIG 54 01/13/2015   Accessory Clinical Findings  ECG - SR, normal ECG  Echocardiogram 07/11/2012  - Left ventricle: The cavity size was normal. There was moderate  concentric hypertrophy. Systolic function was normal. The estimated ejection fraction was in the range of 60% to 65%. Wall motion was normal; there were no regional wall motion abnormalities. - Left atrium: The atrium was mildly dilated.   Assessment & Plan  A pleasant 65 year old male with h/o NIDDM and thalamic stroke in June 2014  1. Resistant hypertension - with h/o stroke and an evidence of moderate LVH with diastolic dysfunction on echocardiogram. His BP is controlled after increasing his amlodipine to 10 mg po daily, and clonidine. A renal arterial duplex was negative for stenosis, both kidneys are normal size. His blood pressure is now controlled, we will continue the same regimen. He is encouraged to start exercise program once he retires.  His carotid ultrasound was normal in 2014, no new  symptoms, no bruit no need to repeat.  2. Hyperlipidemia - all of his lipids at goal in December 2016  3. DM - HbA1c 6.9%. Follow up in 2 weeks with BP diary (mornings and evenings).    Follow up in 1 year.    Lars Masson, MD, [facility]  02/27/2015, 12:08 PM

## 2015-03-01 ENCOUNTER — Encounter: Payer: 59 | Admitting: Internal Medicine

## 2015-03-01 ENCOUNTER — Encounter: Payer: Self-pay | Admitting: Internal Medicine

## 2015-03-01 ENCOUNTER — Ambulatory Visit (AMBULATORY_SURGERY_CENTER): Payer: PRIVATE HEALTH INSURANCE | Admitting: Internal Medicine

## 2015-03-01 VITALS — BP 151/65 | HR 65 | Temp 97.5°F | Resp 17 | Ht 65.0 in | Wt 207.0 lb

## 2015-03-01 DIAGNOSIS — Z1211 Encounter for screening for malignant neoplasm of colon: Secondary | ICD-10-CM | POA: Diagnosis not present

## 2015-03-01 MED ORDER — SODIUM CHLORIDE 0.9 % IV SOLN
500.0000 mL | INTRAVENOUS | Status: DC
Start: 2015-03-01 — End: 2015-03-01

## 2015-03-01 NOTE — Patient Instructions (Addendum)
No polyps again!  Next routine colonoscopy in 10 years - 2027  I appreciate the opportunity to care for you.  Iva Boop, MD, [facility]  Discharge instructions given. Normal exam. Resume previous medications. YOU HAD AN ENDOSCOPIC PROCEDURE TODAY AT THE [facility]:   Refer to the procedure report that was given to you for any specific questions about what was found during the examination.  If the procedure report does not answer your questions, please call your gastroenterologist to clarify.  If you requested that your care partner not be given the details of your procedure findings, then the procedure report has been included in a sealed envelope for you to review at your convenience later.  YOU SHOULD EXPECT: Some feelings of bloating in the abdomen. Passage of more gas than usual.  Walking can help get rid of the air that was put into your GI tract during the procedure and reduce the bloating. If you had a lower endoscopy (such as a colonoscopy or flexible sigmoidoscopy) you may notice spotting of blood in your stool or on the toilet paper. If you underwent a bowel prep for your procedure, you may not have a normal bowel movement for a few days.  Please Note:  You might notice some irritation and congestion in your nose or some drainage.  This is from the oxygen used during your procedure.  There is no need for concern and it should clear up in a day or so.  SYMPTOMS TO REPORT IMMEDIATELY:   Following lower endoscopy (colonoscopy or flexible sigmoidoscopy):  Excessive amounts of blood in the stool  Significant tenderness or worsening of abdominal pains  Swelling of the abdomen that is new, acute  Fever of 100F or higher   For urgent or emergent issues, a gastroenterologist can be reached at any hour by calling [phone].   DIET: Your first meal following the procedure should be a small meal and then it is ok to progress to your normal diet. Heavy or fried  foods are harder to digest and may make you feel nauseous or bloated.  Likewise, meals heavy in dairy and vegetables can increase bloating.  Drink plenty of fluids but you should avoid alcoholic beverages for 24 hours.  ACTIVITY:  You should plan to take it easy for the rest of today and you should NOT DRIVE or use heavy machinery until tomorrow (because of the sedation medicines used during the test).    FOLLOW UP: Our staff will call the number listed on your records the next business day following your procedure to check on you and address any questions or concerns that you may have regarding the information given to you following your procedure. If we do not reach you, we will leave a message.  However, if you are feeling well and you are not experiencing any problems, there is no need to return our call.  We will assume that you have returned to your regular daily activities without incident.  If any biopsies were taken you will be contacted by phone or by letter within the next 1-3 weeks.  Please call us at [phone] if you have not heard about the biopsies in 3 weeks.    SIGNATURES/CONFIDENTIALITY: You and/or your care partner have signed paperwork which will be entered into your electronic medical record.  These signatures attest to the fact that that the information above on your After Visit Summary has been reviewed and is understood.  Full responsibility of  the confidentiality of this discharge information lies with you and/or your care-partner. 

## 2015-03-01 NOTE — Progress Notes (Signed)
Report to PACU, RN, vss, BBS= Clear.  

## 2015-03-01 NOTE — Op Note (Signed)
[facility] [address] [place], [address]   COLONOSCOPY PROCEDURE REPORT  PATIENT: Mike, Parker  MR#: [id number] BIRTHDATE: 1950/02/16 , 64  yrs. old GENDER: male ENDOSCOPIST: Iva Boop, MD, [facility] PROCEDURE DATE:  03/01/2015 PROCEDURE:   Colonoscopy, screening First Screening Colonoscopy - Avg.  risk and is 50 yrs.  old or older - No.  Prior Negative Screening - Now for repeat screening. 10 or more years since last screening  History of Adenoma - Now for follow-up colonoscopy & has been > or = to 3 yrs.  N/A  Polyps removed today? No Recommend repeat exam, <10 yrs? No ASA CLASS:   Class III INDICATIONS:Screening for colonic neoplasia and Colorectal Neoplasm Risk Assessment for this procedure is average risk. MEDICATIONS: Propofol 150 mg IV and Monitored anesthesia care  DESCRIPTION OF PROCEDURE:   After the risks benefits and alternatives of the procedure were thoroughly explained, informed consent was obtained.  The digital rectal exam revealed no abnormalities of the rectum, revealed no prostatic nodules, and revealed the prostate was not enlarged.   The LB [id number] endoscope was introduced through the anus and advanced to the cecum, which was identified by both the appendix and ileocecal valve. No adverse events experienced.   The quality of the prep was excellent.  (MiraLax was used)  The instrument was then slowly withdrawn as the colon was fully examined. Estimated blood loss is zero unless otherwise noted in this procedure report.      COLON FINDINGS: A normal appearing cecum, ileocecal valve, and appendiceal orifice were identified.  The ascending, transverse, descending, sigmoid colon, and rectum appeared unremarkable. Retroflexed views revealed no abnormalities. The time to cecum = 1.6 Withdrawal time = 8.2   The scope was withdrawn and the procedure completed. COMPLICATIONS: There were no immediate complications.  ENDOSCOPIC  IMPRESSION: Normal colonoscopy  RECOMMENDATIONS: Repeat colonoscopy 10 years.  eSigned:  Iva Boop, MD, [facility] 03/01/2015 2:21 PM   cc: The Patient and Sharlet Salina, MD

## 2015-03-02 ENCOUNTER — Telehealth: Payer: Self-pay

## 2015-03-02 NOTE — Telephone Encounter (Signed)
  Follow up Call-  Call back number 03/01/2015  Post procedure Call Back phone  # [phone]  Permission to leave phone message No     Patient questions:  Do you have a fever, pain , or abdominal swelling? No. Pain Score  0 *  Have you tolerated food without any problems? Yes.    Have you been able to return to your normal activities? Yes.    Do you have any questions about your discharge instructions: Diet   No. Medications  No. Follow up visit  No.  Do you have questions or concerns about your Care? No.  Actions: * If pain score is 4 or above: No action needed, pain <4.

## 2015-03-19 ENCOUNTER — Other Ambulatory Visit: Payer: Self-pay | Admitting: Internal Medicine

## 2015-03-27 NOTE — Progress Notes (Signed)
Subjective:    Patient ID: Mike Parker, male    DOB: 1950-11-14, 65 y.o.   MRN: [id number]  HPI 65 year old Black Male in today for health maintenance exam and evaluation of medical problems. He is retiring in the near future. Looking forward to that. Blood pressure initially today was 146/88 subsequently repeated was 128/80. He has a history of essential hypertension, controlled type 2 diabetes mellitus, hyperlipidemia, metabolic syndrome, acute left thalamic stroke in the remote past, still has paresthesias in right hand as a result of that stroke and gloves do not help in cold weather, Has  been difficult working in cold weather with hand issues. History of insomnia, obesity, snoring and allergic rhinitis. BMI is 33.  He had left thalamic CVA in June 2014. 2-D echocardiogram done by cardiologist showed moderate LVH and diastolic dysfunction. Renal artery duplex scan was negative for renal artery stenosis. He was evaluated by Dr. Stann Mainland for possible sleep apnea with snoring. Dr. Stann Mainland thought that he just needed to lose weight. Sleep study was not done.  He takes Ativan for sleep issues. Insomnia started after stroke which frightened him quite a bit. Says mother died of a stroke and that is disconcerting to him.  No known drug allergies. History of frozen shoulder treated previously by Dr. Vear Clock.  A colonoscopy 2004. Tetanus immunization 2011. Gets annual flu vaccine.    Review of Systems paresthesias right upper extremity secondary to stroke, anxiety related to stroke, insomnia     Objective:   Physical Exam  Constitutional: He is oriented to person, place, and time. He appears well-developed and well-nourished. No distress.  HENT:  Head: Normocephalic and atraumatic.  Right Ear: External ear normal.  Left Ear: External ear normal.  Mouth/Throat: Oropharynx is clear and moist. No oropharyngeal exudate.  Eyes: Conjunctivae and EOM are normal. Pupils are equal, round, and reactive  to light. Right eye exhibits no discharge. Left eye exhibits no discharge. No scleral icterus.  Neck: Neck supple. No JVD present. No thyromegaly present.  Cardiovascular: Normal rate, normal heart sounds and intact distal pulses.   No murmur heard. Pulmonary/Chest: Effort normal. No respiratory distress. He has no wheezes. He has no rales.  Abdominal: Soft. Bowel sounds are normal. He exhibits no distension and no mass. There is no tenderness. There is no rebound and no guarding.  Genitourinary: Prostate normal.  Musculoskeletal: He exhibits no edema.  Lymphadenopathy:    He has no cervical adenopathy.  Neurological: He is alert and oriented to person, place, and time. He has normal reflexes. No cranial nerve deficit. Coordination normal.  Skin: Skin is warm and dry. No rash noted. He is not diaphoretic.  Psychiatric: He has a normal mood and affect. His behavior is normal. Judgment and thought content normal.  Vitals reviewed.         Assessment & Plan:  History of left thalamic stroke June 2014  1 cm- left thalamic stroke. Currently on aspirin. Has seen Dr. Pearlean Brownie in the past  Essential hypertension-continue to monitor pressure at home  History of snoring  Obesity-BMI of 33. Needs diet exercise and lose weight. Needs motivation and perhaps being retired will help with that. Has controlled type 2 diabetes mellitus  Paresthesias right upper extremity from CVA-never resolved and likely will be permanent  History of insomnia status post stroke  Controlled type 2 diabetes mellitus-hemoglobin A1c is 7% and previously was 7.5%. Continue same treatment. Encouraged diet exercise and weight loss.  Hyperlipidemia-treated with statin medication. Lipid  panel normal  Plan: He is retiring in the near future. Hopefully he will find time to diet exercise and lose weight. Continue same medications return in 6 months.

## 2015-04-01 ENCOUNTER — Other Ambulatory Visit: Payer: Self-pay | Admitting: Internal Medicine

## 2015-04-04 ENCOUNTER — Other Ambulatory Visit: Payer: Self-pay | Admitting: Cardiology

## 2015-06-03 ENCOUNTER — Other Ambulatory Visit: Payer: Self-pay | Admitting: Internal Medicine

## 2015-06-17 ENCOUNTER — Other Ambulatory Visit: Payer: Self-pay | Admitting: Internal Medicine

## 2015-07-07 ENCOUNTER — Other Ambulatory Visit: Payer: 59 | Admitting: Internal Medicine

## 2015-07-07 DIAGNOSIS — E785 Hyperlipidemia, unspecified: Secondary | ICD-10-CM

## 2015-07-07 DIAGNOSIS — E119 Type 2 diabetes mellitus without complications: Secondary | ICD-10-CM

## 2015-07-07 DIAGNOSIS — Z79899 Other long term (current) drug therapy: Secondary | ICD-10-CM

## 2015-07-07 LAB — HEPATIC FUNCTION PANEL
ALK PHOS: 73 U/L (ref 40–115)
ALT: 38 U/L (ref 9–46)
AST: 24 U/L (ref 10–35)
Albumin: 4 g/dL (ref 3.6–5.1)
BILIRUBIN DIRECT: 0.2 mg/dL (ref <=0.2)
BILIRUBIN INDIRECT: 0.5 mg/dL (ref 0.2–1.2)
TOTAL PROTEIN: 6.5 g/dL (ref 6.1–8.1)
Total Bilirubin: 0.7 mg/dL (ref 0.2–1.2)

## 2015-07-07 LAB — LIPID PANEL
CHOLESTEROL: 71 mg/dL — AB (ref 125–200)
HDL: 37 mg/dL — ABNORMAL LOW (ref >=40)
LDL Cholesterol: 18 mg/dL (ref <130)
TRIGLYCERIDES: 82 mg/dL (ref <150)
Total CHOL/HDL Ratio: 1.9 Ratio (ref <=5.0)
VLDL: 16 mg/dL (ref <30)

## 2015-07-07 LAB — HEMOGLOBIN A1C
Hgb A1c MFr Bld: 9.2 % — ABNORMAL HIGH (ref <5.7)
MEAN PLASMA GLUCOSE: 217 mg/dL

## 2015-07-11 ENCOUNTER — Ambulatory Visit (INDEPENDENT_AMBULATORY_CARE_PROVIDER_SITE_OTHER): Payer: 59 | Admitting: Internal Medicine

## 2015-07-11 ENCOUNTER — Encounter: Payer: Self-pay | Admitting: Internal Medicine

## 2015-07-11 VITALS — BP 152/76 | HR 76 | Temp 98.0°F | Resp 18 | Wt 208.0 lb

## 2015-07-11 DIAGNOSIS — Z8673 Personal history of transient ischemic attack (TIA), and cerebral infarction without residual deficits: Secondary | ICD-10-CM | POA: Diagnosis not present

## 2015-07-11 DIAGNOSIS — R0683 Snoring: Secondary | ICD-10-CM

## 2015-07-11 DIAGNOSIS — E785 Hyperlipidemia, unspecified: Secondary | ICD-10-CM | POA: Diagnosis not present

## 2015-07-11 DIAGNOSIS — I10 Essential (primary) hypertension: Secondary | ICD-10-CM

## 2015-07-11 DIAGNOSIS — IMO0001 Reserved for inherently not codable concepts without codable children: Secondary | ICD-10-CM

## 2015-07-11 DIAGNOSIS — E8881 Metabolic syndrome: Secondary | ICD-10-CM

## 2015-07-11 DIAGNOSIS — Z0000 Encounter for general adult medical examination without abnormal findings: Secondary | ICD-10-CM

## 2015-07-11 DIAGNOSIS — E669 Obesity, unspecified: Secondary | ICD-10-CM

## 2015-07-11 DIAGNOSIS — R030 Elevated blood-pressure reading, without diagnosis of hypertension: Secondary | ICD-10-CM

## 2015-07-11 DIAGNOSIS — E119 Type 2 diabetes mellitus without complications: Secondary | ICD-10-CM | POA: Diagnosis not present

## 2015-07-17 ENCOUNTER — Telehealth: Payer: Self-pay | Admitting: Internal Medicine

## 2015-07-17 MED ORDER — BLOOD GLUCOSE METER KIT: PACK | Status: AC

## 2015-07-17 NOTE — Telephone Encounter (Signed)
Patient also has the following readings from his blood pressure and sugars: 07/12/15 9:00am 160; 8:15pm 120; BP: 11am- 144/85  07/13/15 9:00am 114; 10:00pm 133; BP: not checked  07/14/15 9:41am 139; 11:10pm 92; BP: 4:18pm 142/85  07/15/15 9:30am 116; 11:04pm 96; BP: 1:27pm 141/86  07/16/15 8:20am 116; 10:53pm 136; BP: 11:59am 139/84  07/17/15 8:30am 120  Patient states that he eats 2 meals daily 12:00pm and 7:30pm with 1 snack daily.

## 2015-07-17 NOTE — Telephone Encounter (Signed)
Patient walk-in and is requesting new test strips please.  He has a Physicist, medicalree Style Lite Glucometer.  Needs test strips please.  States that if insurance doesn't pay for the strips, he would like a machine please.    Pharmacy:  [facility] [address]

## 2015-07-17 NOTE — Telephone Encounter (Signed)
Please refill bid testing for one year with meter

## 2015-07-26 ENCOUNTER — Encounter: Payer: Self-pay | Admitting: Internal Medicine

## 2015-07-26 NOTE — Patient Instructions (Signed)
Referrals to cardiology and endocrinology. Need better control of diabetes. Med consult from cardiologist. He's on several drug regimen at the present time. Encouraged diet exercise and weight loss. Follow-up in 6 months.

## 2015-07-26 NOTE — Progress Notes (Signed)
Subjective:    Patient ID: Mike Parker, male    DOB: [birth year], 65 y.o.   MRN: [id number]  HPI Pleasant 65 year old Black male in today for health maintenance exam and evaluation of medical issues. At the end of the year he retired after 40 years with post [employer]. Says he may look for other employment. 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 Wife is still working.  He has a history of essential hypertension, controlled type 2 diabetes mellitus, hyperlipidemia, metabolic syndrome, obesity.  He had a left thalamic CVA June 2014. 2-D echocardiogram done at that time a cardiologist showed moderate LVH and diastolic dysfunction. Renal artery duplex scan was negative for renal artery stenosis. Still has some paresthesias in his right hand status post CVA.  He's been evaluated by Dr. Shelle Ironlance for possible sleep apnea with snoring. Dr. Shelle Ironlance thought he just needed to lose weight. Sleep study was not done.  He takes Ativan for insomnia. The insomnia started after stroke which frightened him quite a bit. Says his mother died of a stroke and that is disconcerting to him.  History of frozen shoulder treated previously by Dr. Teressa SenterSypher.  No known drug allergies  Social history: He is married. Wife continues to work at [employer]. 2 adult daughters. 2 grandchildren. Does not smoke. Does not consume alcohol.  Family history: Mother died of a stroke    Review of Systems  Constitutional: Negative.   HENT: Negative.   Respiratory: Negative.   Genitourinary: Negative.   Neurological:       Paresthesias right hand status post left thalamic CVA 2014       Objective:   Physical Exam  Constitutional: He is oriented to person, place, and time. He appears well-developed and well-nourished. No distress.  HENT:  Head: Normocephalic and atraumatic.  Right Ear: External ear normal.  Left Ear: External ear normal.  Mouth/Throat: Oropharynx is clear and moist. No oropharyngeal exudate.  Eyes:  Conjunctivae are normal. Pupils are equal, round, and reactive to light. Right eye exhibits no discharge. No scleral icterus.  Neck: Neck supple. No JVD present. No thyromegaly present.  Cardiovascular: Normal rate, regular rhythm, normal heart sounds and intact distal pulses.   No murmur heard. Pulmonary/Chest: Effort normal. No respiratory distress. He has no wheezes. He has no rales.  Abdominal: Soft. Bowel sounds are normal. He exhibits no distension and no mass. There is no tenderness. There is no rebound and no guarding.  Genitourinary: Prostate normal.  Musculoskeletal: He exhibits no edema.  Lymphadenopathy:    He has no cervical adenopathy.  Neurological: He is alert and oriented to person, place, and time. He has normal reflexes. No cranial nerve deficit. Coordination normal.  Skin: Skin is warm and dry. No rash noted. He is not diaphoretic.  Psychiatric: He has a normal mood and affect. His behavior is normal. Judgment and thought content normal.  Vitals reviewed.         Assessment & Plan:  Normal health maintenance exam  Controlled type 2 diabetes mellitus-Poorly controlled at present refer to endocrinologist  History of snoring-needs to lose weight  History of left thalamic CVA with resultant paresthesia right hand  Elevated blood pressure-is on several drugs. Needs cardiology consultation for medication and evaluation  Essential hypertension-blood pressure recheck several times daily and is persistently elevated. Get cardiology consultation regarding medication management  Metabolic syndrome  Obesity-needs to take diet exercise and weight loss seriously now that he is retired  Hyperlipidemia-stable on statin therapy  Plan: Encouraged diet  exercise and weight loss. I had hoped once he had retired he would have more time for that. I am disappointed his hemoglobin A1c has increased from 7% to 9.2%. I'm going to refer him to endocrinologist. His lipid panel is within  normal limits. Liver panel is normal.

## 2015-07-28 ENCOUNTER — Other Ambulatory Visit: Payer: Self-pay | Admitting: Internal Medicine

## 2015-08-11 ENCOUNTER — Ambulatory Visit (INDEPENDENT_AMBULATORY_CARE_PROVIDER_SITE_OTHER): Payer: PRIVATE HEALTH INSURANCE | Admitting: Endocrinology

## 2015-08-11 ENCOUNTER — Encounter: Payer: Self-pay | Admitting: Endocrinology

## 2015-08-11 VITALS — BP 130/82 | HR 75 | Wt 204.0 lb

## 2015-08-11 DIAGNOSIS — N62 Hypertrophy of breast: Secondary | ICD-10-CM

## 2015-08-11 DIAGNOSIS — E1165 Type 2 diabetes mellitus with hyperglycemia: Secondary | ICD-10-CM

## 2015-08-11 DIAGNOSIS — E1169 Type 2 diabetes mellitus with other specified complication: Secondary | ICD-10-CM | POA: Diagnosis not present

## 2015-08-11 DIAGNOSIS — N521 Erectile dysfunction due to diseases classified elsewhere: Secondary | ICD-10-CM

## 2015-08-11 MED ORDER — METFORMIN HCL ER 500 MG PO TB24: 1000.0000 mg | ORAL_TABLET | Freq: Every day | ORAL | Status: DC

## 2015-08-11 NOTE — Progress Notes (Signed)
Patient ID: Mike Parker, male   DOB: Jun 12, 1950, 66 y.o.   MRN: [id number]           Reason for Appointment: Consultation for Type 2 Diabetes  Referring physician: Baxley   History of Present Illness:          Date of diagnosis of type 2 diabetes mellitus: ?  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  Also he thinks that he has had a poor diet after retirement and eating without paying attention to the diet  He was also not checking his blood sugars much prior to his office visit with his PCP in June  He does not have a meal plan to follow  He is now trying to be somewhat more cautious with his diet  Currently checking blood sugars only fasting and these are looking fairly good usually  He has only a couple of days last month where he checked his blood sugar after dinner and these were fairly good  He does not restrict high-fat foods and snacks from his diet   He is somewhat concerned about the cost of his brand name Janumet.  Is currently taking 50/500 and has never taken a higher dose       Side effects from medications have been: None  Compliance with the medical regimen: Inconsistent Hypoglycemia:   none  Glucose monitoring:  done 1 times a day         Glucometer: FreeStyle Blood Glucose readings by time of day and averages from meter download:  PREMEAL Breakfast Lunch Dinner Bedtime  Overall   Glucose range:  99-170   92, 136    Median: 124      125    Self-care: The diet  that the patient has been following is: None Meal times are:  Breakfast is at 12 noon, dinner usually 7-8 PM Typical meal intake: Breakfast is  eggs, sausage, applesauce.  Lunch will be very do, hashbrowns.  Dinner is chicken or pork, greens, corn needed snacks usually nuts, fruits or chips               Dietician visit, most recent: never               Exercise:  walking 45 minutes 2-3 times a week and mowing the lawn once or twice a week  Weight history:  Wt Readings from Last 3 Encounters:  08/11/15 204 lb (92.534 kg)  07/11/15 208 lb (94.348 kg)  03/01/15 207 lb (93.895 kg)    Glycemic control:   Lab Results  Component Value Date   HGBA1C 9.2* 07/07/2015   HGBA1C 7.0* 01/13/2015   HGBA1C 7.5* 09/12/2014   Lab Results  Component Value Date   MICROALBUR 0.6 03/10/2014   LDLCALC 18 07/07/2015   CREATININE 1.14 01/13/2015   Lab Results  Component Value Date   MICRALBCREAT 11.0 03/10/2014  Medication List       This list is accurate as of: 08/11/15 12:51 PM.  Always use your most recent med list.               amLODipine 10 MG tablet  Commonly known as:  NORVASC  TAKE 1 TABLET DAILY     aspirin 81 MG EC tablet  Take 1 tablet (81 mg total) by mouth daily.     atorvastatin 10 MG tablet  Commonly known as:  LIPITOR  TAKE 1 TABLET AT SUPPER FOR LIPID LOWERING     blood glucose meter kit and supplies  Dispense based on patient and insurance preference. Test twice daily; dx code: W38.9     BYSTOLIC 10 MG tablet  Generic drug:  nebivolol  TAKE 1 TABLET DAILY     cloNIDine 0.1 MG tablet  Commonly known as:  CATAPRES  TAKE 1 TABLET TWICE A DAY (OVERDUE FOR AN APPOINTMENT PLEASE CALL AND SCHEDULE FOR FURTHER REFILLS)     econazole nitrate 1 % cream  APPLY TOPICALLY DAILY     fluticasone 50 MCG/ACT nasal spray  Commonly known as:  FLONASE  Place 2 sprays into both nostrils daily as needed. Reported on 08/11/2015     freestyle lancets      FREESTYLE LITE test strip  Generic drug:  glucose blood     furosemide 40 MG tablet  Commonly known as:  LASIX  TAKE 1 TABLET DAILY     GLIPIZIDE XL 10 MG 24 hr tablet  Generic drug:  glipiZIDE  TAKE 1 TABLET DAILY     JANUMET 50-500 MG tablet  Generic drug:  sitaGLIPtin-metformin  TAKE 1 TABLET TWICE A DAY     LORazepam 1 MG tablet  Commonly known as:  ATIVAN  Take 1 tablet (1 mg total) by mouth at bedtime as needed.     losartan 100 MG tablet  Commonly known as:  COZAAR  TAKE 1 TABLET DAILY     metFORMIN 500 MG 24 hr tablet  Commonly known as:  GLUCOPHAGE-XR  Take 2 tablets (1,000 mg total) by mouth daily with supper.        Allergies: No Known Allergies  Past Medical History  Diagnosis Date  . Hypertension   . Allergy   . Diabetes mellitus   . Insomnia     Past Surgical History  Procedure Laterality Date  . Hernia repair      left inguinal  . Knee surgery      rt and left  . Elbow surgery      rt elbow  . Colonoscopy      Family History  Problem Relation Age of Onset  . Stroke Mother   . Hypertension Brother   . Colon cancer Neg Hx   . Diabetes Neg Hx     Social History:  reports that he has never smoked. He has never used smokeless tobacco. He reports that he does not drink alcohol or use illicit drugs.   Review of Systems  Constitutional:       Recently lost 4 pounds with improving diet  HENT: Negative for headaches.   Eyes: Negative for blurred vision.  Respiratory: Negative for shortness of breath.   Cardiovascular: Negative for chest pain and leg swelling.  Gastrointestinal: Negative for diarrhea.  Endocrine: Positive for erectile dysfunction. Negative for fatigue and decreased libido.       He has had erectile dysfunction for some time, previously prescribed by a by urologist.  He is not concerned by this currently.  Genitourinary: Positive for nocturia.       Occasionally  Musculoskeletal: Negative for joint pain and muscle aches.    Skin: Negative for rash.  Neurological: Negative for numbness and tingling.     Lipid history: 1 yr    Lab Results  Component Value Date   CHOL 71* 07/07/2015   HDL 37* 07/07/2015   LDLCALC 18 07/07/2015   TRIG 82 07/07/2015   CHOLHDL 1.9 07/07/2015           Hypertension:15 yrs  Most recent eye exam was 6/17  Most recent foot exam:    LABS:  No visits with results within 1 Week(s) from this visit. Latest known visit with results is:  Lab on 07/07/2015  Component Date Value Ref Range Status  . Cholesterol 07/07/2015 71* 125 - 200 mg/dL Final  . Triglycerides 07/07/2015 82  <150 mg/dL Final  . HDL 07/07/2015 37* >=40 mg/dL Final  . Total CHOL/HDL Ratio 07/07/2015 1.9  <=5.0 Ratio Final  . VLDL 07/07/2015 16  <30 mg/dL Final  . LDL Cholesterol 07/07/2015 18  <130 mg/dL Final   Comment:   Total Cholesterol/HDL Ratio:CHD Risk                        Coronary Heart Disease Risk Table                                        Men       Women          1/2 Average Risk              3.4        3.3              Average Risk              5.0        4.4           2X Average Risk              9.6        7.1           3X Average Risk             23.4       11.0 Use the calculated Patient Ratio above and the CHD Risk table  to determine the patient's CHD Risk.   . Total Bilirubin 07/07/2015 0.7  0.2 - 1.2 mg/dL Final  . Bilirubin, Direct 07/07/2015 0.2  <=0.2 mg/dL Final  . Indirect Bilirubin 07/07/2015 0.5  0.2 - 1.2 mg/dL Final  . Alkaline Phosphatase 07/07/2015 73  40 - 115 U/L Final  . AST 07/07/2015 24  10 - 35 U/L Final  . ALT 07/07/2015 38  9 - 46 U/L Final  . Total Protein 07/07/2015 6.5  6.1 - 8.1 g/dL Final  . Albumin 07/07/2015 4.0  3.6 - 5.1 g/dL Final  . Hgb A1c MFr Bld 07/07/2015 9.2* <5.7 % Final   Comment:   For someone without known diabetes, a hemoglobin A1c value of 6.5% or greater indicates that they may have diabetes and this should be confirmed with a  follow-up test.   For someone with known diabetes, a value <7% indicates that their diabetes is well controlled and a value greater than or equal to  7% indicates suboptimal control. A1c targets should be individualized based on duration of diabetes, age, comorbid conditions, and other considerations.   Currently, no consensus exists for use of hemoglobin A1c for diagnosis of diabetes for children.     . Mean Plasma Glucose 07/07/2015 217   Final    Physical Examination:  BP 130/82 mmHg  Pulse 75  Wt 204 lb (92.534 kg)  SpO2 96%  GENERAL:         Patient has generalized obesity Especially abdominal .   HEENT:         Eye exam shows normal external appearance. Fundus exam shows no retinopathy.  Oral exam shows normal mucosa .  NECK:   There is no lymphadenopathy Thyroid is not enlarged and no nodules felt.  Carotids are normal to palpation and no bruit heard Chest: Bilateral clinical mastery of present LUNGS:         Chest is symmetrical. Lungs are clear to auscultation.Marland Kitchen   HEART:         Heart sounds:  S1 and S2 are normal. No murmur or click heard., no S3 or S4.   ABDOMEN:   There is no distention present. Liver and spleen are not palpable. No other mass or tenderness present.   NEUROLOGICAL:   Ankle and biceps jerks are absent bilaterally.    Diabetic Foot Exam - Simple   Simple Foot Form  Diabetic Foot exam was performed with the following findings:  Yes   Visual Inspection  No deformities, no ulcerations, no other skin breakdown bilaterally:  Yes  Sensation Testing  Intact to touch and monofilament testing bilaterally:  Yes  Pulse Check  Posterior Tibialis and Dorsalis pulse intact bilaterally:  Yes  Comments             Vibration sense is Very mildly reduced in distal first toes. MUSCULOSKELETAL:  There is no swelling or deformity of the peripheral joints. Spine is normal to inspection.   EXTREMITIES:     There is no edema. No skin lesions present.Marland Kitchen SKIN:       No  rash or lesions of concern.        ASSESSMENT:  Diabetes type 2, uncontrolled   Last A1c was 9.2, currently on a regimen of Janumet and glipizide ER Patient thinks that his recent poor control is related to his going off his diet significantly and not being as active He is currently on only 500 mg twice a day of Janumet More recently his fasting glucose readings have been fairly good at home, not clear if he still has postprandial hyperglycemia  However his A1c has been consistently over 7% since 2015 and needs improvement He does need significant amount of diabetes education especially meal planning  Complications of diabetes:Erectile dysfunction.  Needs urine microalbumin checked  Erectile dysfunction and gynecomastia: He may have hypogonadism although not clearly symptomatic  HYPERTENSION: Appears well-controlled recently on multiple drugs   PLAN:     Needs to have a total of 2000 mg of metformin daily.  Since he already has a large supply of Janumet taken add metformin ER 1000 mg daily once a day  Discussed possibility of blood sugars getting lower especially in between meals and if glucose readings are under 90 he will reduce his glipizide ER to 5 mg daily, to call if glucose readings are lower  Consultation with dietitian.  He will also have his wife attends the session  Start monitoring readings at least once a day after  various meals and less often in the morning  He will call his insurance to find out which glucose monitor is covered by prescription  Follow-up in 6 weeks for reassessment  Screening for hypogonadism with a.m. testosterone on the next visit  Patient Instructions  Check blood sugars on waking up  3 times a week Also check blood sugars about 2 hours after a meal and do this after different meals by rotation  Recommended blood sugar levels on waking up is 90-130 and about 2 hours after meal is 130-160  Please bring your blood sugar monitor to each visit,  thank you  Metformin 2 tabs at dinner  If sugar is < 90 need to reduce Glipizide      Total visit time including counseling = 60 minutes Consultation note has been sent to PCP   [facility] 08/11/2015, 12:51 PM   Note: This office note was prepared with Dragon voice recognition system technology. Any transcriptional errors that result from this process are unintentional.

## 2015-08-11 NOTE — Patient Instructions (Signed)
Check blood sugars on waking up  3 times a week Also check blood sugars about 2 hours after a meal and do this after different meals by rotation  Recommended blood sugar levels on waking up is 90-130 and about 2 hours after meal is 130-160  Please bring your blood sugar monitor to each visit, thank you  Metformin 2 tabs at dinner  If sugar is < 90 need to reduce Glipizide

## 2015-08-22 ENCOUNTER — Other Ambulatory Visit: Payer: Self-pay

## 2015-08-22 MED ORDER — LORAZEPAM 1 MG PO TABS: 1.0000 mg | ORAL_TABLET | Freq: Every evening | ORAL | 5 refills | Status: DC | PRN

## 2015-09-01 ENCOUNTER — Encounter: Payer: PRIVATE HEALTH INSURANCE | Admitting: Dietician

## 2015-09-14 ENCOUNTER — Ambulatory Visit (INDEPENDENT_AMBULATORY_CARE_PROVIDER_SITE_OTHER): Payer: PRIVATE HEALTH INSURANCE | Admitting: Cardiology

## 2015-09-14 ENCOUNTER — Encounter: Payer: Managed Care, Other (non HMO) | Attending: Endocrinology | Admitting: Dietician

## 2015-09-14 ENCOUNTER — Encounter: Payer: Self-pay | Admitting: Cardiology

## 2015-09-14 ENCOUNTER — Encounter: Payer: Self-pay | Admitting: Dietician

## 2015-09-14 VITALS — BP 124/74 | HR 76 | Ht 65.0 in | Wt 203.0 lb

## 2015-09-14 DIAGNOSIS — I10 Essential (primary) hypertension: Secondary | ICD-10-CM

## 2015-09-14 DIAGNOSIS — I119 Hypertensive heart disease without heart failure: Secondary | ICD-10-CM

## 2015-09-14 DIAGNOSIS — E785 Hyperlipidemia, unspecified: Secondary | ICD-10-CM

## 2015-09-14 DIAGNOSIS — E1165 Type 2 diabetes mellitus with hyperglycemia: Secondary | ICD-10-CM | POA: Insufficient documentation

## 2015-09-14 DIAGNOSIS — Z713 Dietary counseling and surveillance: Secondary | ICD-10-CM | POA: Insufficient documentation

## 2015-09-14 DIAGNOSIS — E118 Type 2 diabetes mellitus with unspecified complications: Secondary | ICD-10-CM

## 2015-09-14 DIAGNOSIS — IMO0002 Reserved for concepts with insufficient information to code with codable children: Secondary | ICD-10-CM

## 2015-09-14 NOTE — Progress Notes (Signed)
Medical Nutrition Therapy:  Appt start time: 1530 end time:  1645.   Assessment:  Primary concerns today: Patient is here with his wife.  He would like to learn portion control and meal timing.  Hx of type 2 diabetes since 2011.  This was well controlled until A1C of 9.2% in June/2017.  He states this was due to increased snacking, decreased exercise since retirement, and weight gain.  He has since changed his diet and bug to exercise.  His CBG's are now 90 in the am and 150-170 after meals.  He states that the added Metformin has caused increased diarrhea and is to discuss this with his MD next week.  He has only been able to take this once per day.   Other hx includes HTN.  He has a Lifestyle meter and needs strips.  Insurance may not cover.  One Touch Verio Flex provided.  Lot [id number], expiration 08/28/15.  He lives with his wife.  She does the shopping and cooking.  She works and therefore dinner is late.  He is retired from [employer] March 01, 2015.  He has gained weight since to highest weight of 208 lbs and has recently lost weight with smaller meals and walking.  Weight today 205 lbs.  Preferred Learning Style:   No preference indicated   Learning Readiness:   Ready  Change in progress   MEDICATIONS: see list to include:  Glipizide XL, Janumet, Metformin (XR)   DIETARY INTAKE:  Usual eating pattern includes 2-3 meals and 1-2 snacks per day.  24-hr recall:  B (1130 AM): sausage, eggs, applesauce OR sausage burrito and hash browns at [employer] 1-2 times per week. Snk ( AM): none  L ( PM): none Snk (3 PM): fruit and/or ham sandwich on white wheat bread without mayo D (8 PM): Take out recently:  Cafeteria, chinese, or subs Snk ( PM): fruit Beverages: water, unsweetened tea, coke zero, occasional other diet soda  Usual physical activity: walking 3 days per week for 40 minutes plus mows his lawn 2 days per week.  Estimated energy needs: 1600 calories 180 g  carbohydrates 100 g protein 53 g fat  Progress Towards Goal(s):  In progress.   Nutritional Diagnosis:  NB-1.1 Food and nutrition-related knowledge deficit As related to balance of carbohydrate, protein, and fat.  As evidenced by patient report.    Intervention:  Nutrition counseling and diabetes education initiated. Discussed Carb Counting by food group as method of portion control, reading food labels, and benefits of increased activity. Also discussed basic physiology of Diabetes, target BG ranges pre and post meals, and A1c.  Marland Kitchen.Keep an active lifestyle.  Aim for at least 30 minutes most days. Good job on the changes that you made.  Aim for 3 Carb Choices per meal (45 grams) +/- 1 either way  Aim for 0-1 Carbs per snack if hungry  Include protein in moderation with your meals and snacks Consider reading food labels for Total Carbohydrate and Fat Grams of foods Consider checking BG at alternate times per day as directed by MD  Consider taking medication as directed by MD   Teaching Method Utilized:  Visual Auditory Hands on  Handouts given during visit include: Living Well with Diabetes Carb Counting and Food Label handouts Meal Plan Card  Snack list  Label reading  Diabetes magazine  Barriers to learning/adherence to lifestyle change: none  Demonstrated degree of understanding via:  Teach Back   Monitoring/Evaluation:  Dietary intake, exercise, label  reading, and body weight prn.

## 2015-09-14 NOTE — Progress Notes (Signed)
Note says he did not like his endocrinologist we referred him to Lucianne Muss(Kumar). [facility]. Remind him that this is a pritority, there are NOT a lot of [place], and perhaps we need to keep this one. He wanted him to attend diabetic classses. Try to find out why this was not a good experience. We need endo for medical mangaement.

## 2015-09-14 NOTE — Progress Notes (Signed)
Patient ID: Mike Parker, male   DOB: 13-Oct-1950, 65 y.o.   MRN: [id number]    Patient Name: Mike LeitzDon K Parker Date of Encounter: 09/14/2015  Primary Care Provider:  Margaree MackintoshBAXLEY,MARY J, MD Primary Cardiologist:  Tobias AlexanderKatarina Deegan Valentino  Problem List   Past Medical History:  Diagnosis Date  . Allergy   . Diabetes mellitus   . Hypertension   . Insomnia    Past Surgical History:  Procedure Laterality Date  . COLONOSCOPY    . ELBOW SURGERY     rt elbow  . HERNIA REPAIR     left inguinal  . KNEE SURGERY     rt and left   Allergies  No Known Allergies  HPI  A very pleasant 65 year old male with long-standing history of hypertension and type 2 diabetes mellitus. He had a left thalamic stroke June 2014 with almost complete recovery. His blood pressures been elevated since that time surrounding the stroke. He is on multiple BP medication regimen, recently his Atenolol was changed to Bystolic (nebivolol), lasix and amlodipine were added.   09/04/2015 - the patient is coming after 6 months, he has retired in February 2017, and admits that it's not being as active as he would like to be. He denies any symptoms of shortness of breath or chest pain, no lower extremity edema claudication or palpitations. However at the last PCP visit he was found to have elevated blood sugars and hemoglobin A1c of 9%. He has been referred to an endocrinologist but he didn't have good experience. He is otherwise enjoying his life with his grandchildren and overall feels good.  Home Medications  Prior to Admission medications   Medication Sig Start Date End Date Taking? Authorizing Provider  amLODipine (NORVASC) 5 MG tablet TAKE 1 TABLET DAILY 07/11/12  Yes Margaree MackintoshMary J Baxley, MD  aspirin EC 81 MG EC tablet Take 1 tablet (81 mg total) by mouth daily. 07/15/12  Yes Nishant Dhungel, MD  atorvastatin (LIPITOR) 10 MG tablet  07/24/12  Yes Historical Provider, MD  fluticasone (FLONASE) 50 MCG/ACT nasal spray Place 2 sprays into both  nostrils daily. 01/05/13  Yes Margaree MackintoshMary J Baxley, MD  FREESTYLE LITE test strip  11/23/11  Yes Historical Provider, MD  furosemide (LASIX) 40 MG tablet Take 1 tablet (40 mg total) by mouth daily. 09/14/12  Yes Margaree MackintoshMary J Baxley, MD  glipiZIDE (GLUCOTROL XL) 10 MG 24 hr tablet Take 1 tablet (10 mg total) by mouth daily. 03/20/12 03/20/13 Yes Margaree MackintoshMary J Baxley, MD  JANUMET 50-500 MG per tablet Take 2 tablets by mouth 2 (two) times daily with a meal. 07/15/12  Yes Nishant Dhungel, MD  Lancets (FREESTYLE) lancets  11/23/11  Yes Historical Provider, MD  LORazepam (ATIVAN) 1 MG tablet  07/24/12  Yes Historical Provider, MD  losartan (COZAAR) 100 MG tablet Take 1 tablet (100 mg total) by mouth daily. 09/14/12  Yes Margaree MackintoshMary J Baxley, MD  nebivolol (BYSTOLIC) 10 MG tablet Take 1 tablet (10 mg total) by mouth daily. 01/05/13  Yes Margaree MackintoshMary J Baxley, MD    Family History  Family History  Problem Relation Age of Onset  . Stroke Mother   . Hypertension Brother   . Colon cancer Neg Hx   . Diabetes Neg Hx     Social History  Social History   Social History  . Marital status: Married    Spouse name: N/A  . Number of children: 2  . Years of education: BS   Occupational History  . 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 Smoking status: Never Smoker  . Smokeless tobacco: Never Used  . Alcohol use No  . Drug use: No  . Sexual activity: Yes   Other Topics Concern  . Not on file   Social History Narrative  . No narrative on file     Review of Systems, as per HPI, otherwise negative General:  No chills, fever, night sweats or weight changes.  Cardiovascular:  No chest pain, dyspnea on exertion, edema, orthopnea, palpitations, paroxysmal nocturnal dyspnea. Dermatological: No rash, lesions/masses Respiratory: No cough, dyspnea Urologic: No hematuria, dysuria Abdominal:   No nausea, vomiting, diarrhea, bright red blood per rectum, melena, or hematemesis Neurologic:  No visual changes, wkns, changes in  mental status. All other systems reviewed and are otherwise negative except as noted above.  Physical Exam  Blood pressure 124/74, pulse 76, height 5\' 5"  (1.651 m), weight 203 lb (92.1 kg).  General: Pleasant, NAD Psych: Normal affect. Neuro: Alert and oriented X 3. Moves all extremities spontaneously. HEENT: Normal  Neck: Supple without bruits or JVD. Lungs:  Resp regular and unlabored, CTA. Heart: RRR no s3, s4, or murmurs. Abdomen: Soft, non-tender, non-distended, BS + x 4.  Extremities: No clubbing, cyanosis or edema. DP/PT/Radials 2+ and equal bilaterally.  Labs:  No results for input(s): CKTOTAL, CKMB, TROPONINI in the last 72 hours. Lab Results  Component Value Date   WBC 6.0 01/13/2015   HGB 14.1 01/13/2015   HCT 43.5 01/13/2015   MCV 79.7 01/13/2015   PLT 226 01/13/2015   No results for input(s): NA, K, CL, CO2, BUN, CREATININE, CALCIUM, PROT, BILITOT, ALKPHOS, ALT, AST, GLUCOSE in the last 168 hours.  Invalid input(s): LABALBU Lab Results  Component Value Date   CHOL 71 (L) 07/07/2015   HDL 37 (L) 07/07/2015   LDLCALC 18 07/07/2015   TRIG 82 07/07/2015   Accessory Clinical Findings  ECG - SR, normal ECG  Echocardiogram 07/11/2012  - Left ventricle: The cavity size was normal. There was moderate concentric hypertrophy. Systolic function was normal. The estimated ejection fraction was in the range of 60% to 65%. Wall motion was normal; there were no regional wall motion abnormalities. - Left atrium: The atrium was mildly dilated.  EKG performed on 09/14/2015 showed normal sinus rhythm normal ECG and unchanged from prior.   Assessment & Plan  A pleasant 65 year old male with h/o NIDDM and thalamic stroke in June 2014  1. Resistant hypertension - with h/o stroke and an evidence of moderate LVH with diastolic dysfunction on echocardiogram. A renal arterial duplex was negative for stenosis, both kidneys are normal size. His blood pressure is now controlled  on current regimen we will continue.  His carotid ultrasound was normal in 2014, no new symptoms, no bruit no need to repeat.  2. Hyperlipidemia - all of his lipids at goal in June 2017, his cholesterol has always been well controlled he is on primary CAD prevention  3. DM - HbA1c 9.2%, he is advised to find a new endocrinologist.  Follow up in 6 months.    Tobias AlexanderKatarina Kaliel Bolds, MD, [facility]  09/14/2015, 9:42 AM

## 2015-09-14 NOTE — Patient Instructions (Signed)
Medication Instructions:   Your physician recommends that you continue on your current medications as directed. Please refer to the Current Medication list given to you today.    Follow-Up:  Your physician wants you to follow-up in: 6 MONTHS WITH DR NELSON You will receive a reminder letter in the mail two months in advance. If you Damek't receive a letter, please call our office to schedule the follow-up appointment.        If you need a refill on your cardiac medications before your next appointment, please call your pharmacy.   

## 2015-09-14 NOTE — Patient Instructions (Signed)
Keep an active lifestyle.  Aim for at least 30 minutes most days. Good job on the changes that you made.  Aim for 3 Carb Choices per meal (45 grams) +/- 1 either way  Aim for 0-1 Carbs per snack if hungry  Include protein in moderation with your meals and snacks Consider reading food labels for Total Carbohydrate and Fat Grams of foods Consider checking BG at alternate times per day as directed by MD  Consider taking medication as directed by MD

## 2015-09-15 NOTE — Progress Notes (Signed)
Spoke with patient and made appointment for 9/12 @ 10:45.  Patient has appointment with Lucianne MussKumar at

## 2015-09-19 ENCOUNTER — Other Ambulatory Visit (INDEPENDENT_AMBULATORY_CARE_PROVIDER_SITE_OTHER): Payer: PRIVATE HEALTH INSURANCE

## 2015-09-19 DIAGNOSIS — E1165 Type 2 diabetes mellitus with hyperglycemia: Secondary | ICD-10-CM

## 2015-09-19 DIAGNOSIS — N62 Hypertrophy of breast: Secondary | ICD-10-CM

## 2015-09-19 LAB — BASIC METABOLIC PANEL
BUN: 11 mg/dL (ref 6–23)
CHLORIDE: 102 meq/L (ref 96–112)
CO2: 28 meq/L (ref 19–32)
CREATININE: 1.04 mg/dL (ref 0.40–1.50)
Calcium: 9.1 mg/dL (ref 8.4–10.5)
GFR: 92.49 mL/min (ref >60.00)
Glucose, Bld: 140 mg/dL — ABNORMAL HIGH (ref 70–99)
POTASSIUM: 4 meq/L (ref 3.5–5.1)
Sodium: 139 mEq/L (ref 135–145)

## 2015-09-20 LAB — TESTOSTERONE, FREE, TOTAL, SHBG
SEX HORMONE BINDING: 34 nmol/L (ref 19.3–76.4)
Testosterone, Free: 8.6 pg/mL (ref 6.6–18.1)
Testosterone: 366 ng/dL (ref 264–916)

## 2015-09-20 LAB — FRUCTOSAMINE: Fructosamine: 259 umol/L (ref 0–285)

## 2015-09-22 ENCOUNTER — Ambulatory Visit (INDEPENDENT_AMBULATORY_CARE_PROVIDER_SITE_OTHER): Payer: PRIVATE HEALTH INSURANCE | Admitting: Endocrinology

## 2015-09-22 ENCOUNTER — Encounter: Payer: Self-pay | Admitting: Endocrinology

## 2015-09-22 VITALS — BP 128/76 | HR 75 | Ht 65.0 in | Wt 201.0 lb

## 2015-09-22 DIAGNOSIS — E1165 Type 2 diabetes mellitus with hyperglycemia: Secondary | ICD-10-CM | POA: Diagnosis not present

## 2015-09-22 NOTE — Progress Notes (Signed)
Patient ID: Mike Parker, male   DOB: Aug 23, 1950, 65 y.o.   MRN: [id number]           Reason for Appointment: Follow-up for Type 2 Diabetes  Referring physician: Baxley   History of Present Illness:          Date of diagnosis of type 2 diabetes mellitus: ?  2011        Background history:   He appears to have been on glipizide and Janumet for several years with fair control His A1c has been over 7% since 2015  Recent history:   He was referred here because of an A1c of 9.2% done in 6/17  Non-insulin hypoglycemic drugs the patient is taking are: Janumet 50/500 twice a day, glipizide ER 10 mg daily, metformin ER 500 mg  Current management, blood sugar patterns and problems identified:  He was told to add metformin to his Janumet since he had a large supply of Janumet  However he says he cannot tolerate more than an extra one tablet of metformin ER 500 mg because of diarrhea  His blood sugars however are looking fairly good most of the time in averaging 123  HIGHEST blood sugars are usually midday but not consistent and he thinks his largest meal is at breakfast  He has seen the dietitian with his wife and is starting to make changes  His weight is down another 3 pounds  He is also trying to be active with walking or other activities  No symptoms of hypoglycemia except rarely but his lowest blood sugar has been 73 at bedtime  He is checking his blood sugars consistently at various times as directed       Side effects from medications have been: None  Compliance with the medical regimen: Inconsistent Hypoglycemia:   none  Glucose monitoring:  done 1 times a day         Glucometer: FreeStyle Blood Glucose readings by time of day and averages from meter download for the last 4 weeks:  Mean values apply above for all meters except median for One Touch  PRE-MEAL Fasting Lunch Dinner Bedtime Overall  Glucose range: 75-171  117-221      Mean/median: 115  159  110  111   123     Self-care: The diet that the patient has been following is: None Meal times are:  Breakfast is at 12 noon, dinner usually 7-8 PM Typical meal intake: Breakfast is  eggs, sausage, applesauce.  Lunch will be very do, hashbrowns.  Dinner is chicken or pork, greens, corn needed snacks usually nuts, fruits or chips               Dietician visit, most recent: 8/17               Exercise:  walking 45 minutes 2-3 times a week and mowing the lawn once or twice a week  Weight history:  Wt Readings from Last 3 Encounters:  09/22/15 201 lb (91.2 kg)  09/14/15 203 lb (92.1 kg)  09/14/15 203 lb (92.1 kg)    Glycemic control:   Lab Results  Component Value Date   HGBA1C 9.2 (H) 07/07/2015   HGBA1C 7.0 (H) 01/13/2015   HGBA1C 7.5 (H) 09/12/2014   Lab Results  Component Value Date   MICROALBUR 0.6 03/10/2014   LDLCALC 18 07/07/2015   CREATININE 1.04 09/19/2015   Lab Results  Component Value Date   MICRALBCREAT 11.0 03/10/2014  Medication List       Accurate as of 09/22/15 10:34 AM. Always use your most recent med list.          amLODipine 10 MG tablet Commonly known as:  NORVASC TAKE 1 TABLET DAILY   aspirin 81 MG EC tablet Take 1 tablet (81 mg total) by mouth daily.   atorvastatin 10 MG tablet Commonly known as:  LIPITOR TAKE 1 TABLET AT SUPPER FOR LIPID LOWERING   blood glucose meter kit and supplies Dispense based on patient and insurance preference. Test twice daily; dx code: U82.8   BYSTOLIC 10 MG tablet Generic drug:  nebivolol TAKE 1 TABLET DAILY   cloNIDine 0.1 MG tablet Commonly known as:  CATAPRES TAKE 1 TABLET TWICE A DAY (OVERDUE FOR AN APPOINTMENT PLEASE CALL AND SCHEDULE FOR FURTHER REFILLS)   econazole nitrate 1 % cream APPLY TOPICALLY DAILY   fluticasone 50 MCG/ACT nasal spray Commonly known as:  FLONASE Place 2 sprays into both nostrils daily as needed. Reported on 08/11/2015   freestyle lancets   FREESTYLE LITE test  strip Generic drug:  glucose blood   furosemide 40 MG tablet Commonly known as:  LASIX TAKE 1 TABLET DAILY   GLIPIZIDE XL 10 MG 24 hr tablet Generic drug:  glipiZIDE TAKE 1 TABLET DAILY   JANUMET 50-500 MG tablet Generic drug:  sitaGLIPtin-metformin TAKE 1 TABLET TWICE A DAY   LORazepam 1 MG tablet Commonly known as:  ATIVAN Take 1 tablet (1 mg total) by mouth at bedtime as needed.   losartan 100 MG tablet Commonly known as:  COZAAR TAKE 1 TABLET DAILY   metFORMIN 500 MG 24 hr tablet Commonly known as:  GLUCOPHAGE-XR Take 2 tablets (1,000 mg total) by mouth daily with supper.       Allergies: No Known Allergies  Past Medical History:  Diagnosis Date  . Allergy   . Diabetes mellitus   . Hypertension   . Insomnia     Past Surgical History:  Procedure Laterality Date  . COLONOSCOPY    . ELBOW SURGERY     rt elbow  . HERNIA REPAIR     left inguinal  . KNEE SURGERY     rt and left    Family History  Problem Relation Age of Onset  . Stroke Mother   . Hypertension Brother   . Colon cancer Neg Hx   . Diabetes Neg Hx     Social History:  reports that he has never smoked. He has never used smokeless tobacco. He reports that he does not drink alcohol or use drugs.   Review of Systems     Lipid history: On treatment for about a year    Lab Results  Component Value Date   CHOL 71 (L) 07/07/2015   HDL 37 (L) 07/07/2015   LDLCALC 18 07/07/2015   TRIG 82 07/07/2015   CHOLHDL 1.9 07/07/2015           Hypertension:Treated for 15 yrs  Most recent eye exam was 6/17  Most recent foot exam:7/17    LABS:  Lab on 09/19/2015  Component Date Value Ref Range Status  . Sodium 09/19/2015 139  135 - 145 mEq/L Final  . Potassium 09/19/2015 4.0  3.5 - 5.1 mEq/L Final  . Chloride 09/19/2015 102  96 - 112 mEq/L Final  . CO2 09/19/2015 28  19 - 32 mEq/L Final  . Glucose, Bld 09/19/2015 140* 70 - 99 mg/dL Final  . BUN 09/19/2015 11  6 -  23 mg/dL Final  .  Creatinine, Ser 09/19/2015 1.04  0.40 - 1.50 mg/dL Final  . Calcium 09/19/2015 9.1  8.4 - 10.5 mg/dL Final  . GFR 09/19/2015 92.49  >60.00 mL/min Final  . Fructosamine 09/20/2015 259  0 - 285 umol/L Final   Comment: Published reference interval for apparently healthy subjects between age 23 and 74 is 65 - 285 umol/L and in a poorly controlled diabetic population is 228 - 563 umol/L with a mean of 396 umol/L.   Marland Kitchen Testosterone 09/20/2015 366  264 - 916 ng/dL Final   Comment: Adult male reference interval is based on a population of healthy nonobese males (BMI <30) between 65 and 44 years old. [place], [place] [id number]. PMID: 93112162.   Marland Kitchen Testosterone, Free 09/20/2015 8.6  6.6 - 18.1 pg/mL Final  . Sex Hormone Binding 09/20/2015 34.0  19.3 - 76.4 nmol/L Final    Physical Examination:  BP 128/76   Pulse 75   Ht _0  (1.651 m)   Wt 201 lb (91.2 kg)   SpO2 97%   BMI 33.45 kg/m        ASSESSMENT:  Diabetes type 2 with BMI 33 See history of present illness for detailed discussion of current diabetes management, blood sugar patterns and problems identified  Although his A1c was 9.2 earlier this year his blood sugars are excellent now This is primarily from his trying to improve  diet and walk regularly.  Also benefiting from recent consultation with dietitian   He has only a few high readings after breakfast which is his main meal No hypoglycemia with current regimen of 10 mg glipizide  Also benefiting from extra 500 mg of metformin ER that was started, cannot tolerate a higher dose  Needs urine microalbumin checked  Erectile dysfunction and gynecomastia: He has normal testosterone level  PLAN:    No change in medications  May check blood sugars less often and especially fasting  Recheck A1c in November  Patient Instructions  Check blood sugars on waking up  2-3x per week  Also check blood sugars about 2 hours after a meal and do this after different  meals by rotation  Recommended blood sugar levels on waking up is 90-130 and about 2 hours after meal is 130-160  Please bring your blood sugar monitor to each visit, thank you      [facility] 09/22/2015, 10:34 AM   Note: This office note was prepared with Dragon voice recognition system technology. Any transcriptional errors that result from this process are unintentional.

## 2015-09-22 NOTE — Patient Instructions (Signed)
Check blood sugars on waking up  2-3x per week  Also check blood sugars about 2 hours after a meal and do this after different meals by rotation  Recommended blood sugar levels on waking up is 90-130 and about 2 hours after meal is 130-160  Please bring your blood sugar monitor to each visit, thank you  

## 2015-10-03 ENCOUNTER — Other Ambulatory Visit: Payer: Self-pay

## 2015-10-03 MED ORDER — GLUCOSE BLOOD VI STRP: ORAL_STRIP | 2 refills | Status: DC

## 2015-10-10 ENCOUNTER — Encounter: Payer: Self-pay | Admitting: Internal Medicine

## 2015-10-10 ENCOUNTER — Ambulatory Visit (INDEPENDENT_AMBULATORY_CARE_PROVIDER_SITE_OTHER): Payer: 59 | Admitting: Internal Medicine

## 2015-10-10 VITALS — BP 136/78 | HR 72 | Temp 97.5°F | Ht 65.0 in | Wt 196.2 lb

## 2015-10-10 DIAGNOSIS — E119 Type 2 diabetes mellitus without complications: Secondary | ICD-10-CM | POA: Diagnosis not present

## 2015-10-10 DIAGNOSIS — Z23 Encounter for immunization: Secondary | ICD-10-CM | POA: Diagnosis not present

## 2015-10-10 NOTE — Progress Notes (Signed)
   Subjective:    Patient ID: Mike Parker, male    DOB: [birth year], 65 y.o.   MRN: [id number]  HPI   Here today for follow up on diabetic control. Has seen Dr. Lucianne MussKumar. Says metformin causes diarrhea. Currently taling metformin 500 mg bid plus Janumet plus glipizide  Regarding HTN, BP control is good.  Has lost apprx 12 pounds since last visit here.  Flu vaccine given today.  Over Labor Day had accucheck 227 However, he  went out to eat and had a good time  Hemoglobin A1c improved from 9.2-6.6% with the help of Dr. Lucianne MussKumar  Review of Systems see above     Objective:   Physical Exam  Skin warm and dry. Nodes none. Neck is supple without JVD thyromegaly or carotid bruits.      Assessment & Plan:  Better controlled diabetes mellitus. Dr. Lucianne MussKumar has helped a great deal.  Plan: Patient should continue to see Dr. Lucianne MussKumar for follow-up of diabetes. Follow-up in December for physical exam here

## 2015-10-11 LAB — HEMOGLOBIN A1C
Hgb A1c MFr Bld: 6.6 % — ABNORMAL HIGH (ref <5.7)
Mean Plasma Glucose: 143 mg/dL

## 2015-10-12 ENCOUNTER — Telehealth: Payer: Self-pay

## 2015-10-12 NOTE — Telephone Encounter (Signed)
Called patient to give lab results. No answer. Vmail not set up.  Will try later.

## 2015-10-12 NOTE — Telephone Encounter (Signed)
-----   Message from Margaree MackintoshMary J Baxley, MD sent at 10/11/2015 10:00 AM EDT ----- Much better diabetic control

## 2015-10-28 NOTE — Patient Instructions (Addendum)
Continue to see Dr. Lucianne MussKumar for follow-up of diabetes. Control is now excellent. Follow-up in December for physical exam here

## 2015-11-03 ENCOUNTER — Other Ambulatory Visit: Payer: Self-pay | Admitting: *Deleted

## 2015-11-03 ENCOUNTER — Telehealth: Payer: Self-pay | Admitting: Endocrinology

## 2015-11-03 MED ORDER — SITAGLIP PHOS-METFORMIN HCL ER 50-500 MG PO TB24: ORAL_TABLET | ORAL | 0 refills | Status: DC

## 2015-11-03 NOTE — Telephone Encounter (Signed)
He can change Janumet to Janumet XR 50/500, 2 tablets daily and continue extra metformin ER 500 mg separately

## 2015-11-03 NOTE — Telephone Encounter (Signed)
Pt came by and said that Dr. Lucianne MussKumar was wanting to put him on a new medication when he ran out of the Janumet.  He said he has about a week left and wanted to let us know so he can get the new script sent to his mail order pharmacy.

## 2015-11-03 NOTE — Telephone Encounter (Signed)
I do not see anything in your notes about switching him to a new medication, please advise.

## 2015-11-03 NOTE — Telephone Encounter (Signed)
Noted rx sent.

## 2015-12-16 ENCOUNTER — Other Ambulatory Visit: Payer: Self-pay | Admitting: Internal Medicine

## 2015-12-20 ENCOUNTER — Other Ambulatory Visit (INDEPENDENT_AMBULATORY_CARE_PROVIDER_SITE_OTHER): Payer: PRIVATE HEALTH INSURANCE

## 2015-12-20 DIAGNOSIS — E1165 Type 2 diabetes mellitus with hyperglycemia: Secondary | ICD-10-CM | POA: Diagnosis not present

## 2015-12-20 LAB — COMPREHENSIVE METABOLIC PANEL
ALT: 26 U/L (ref 0–53)
AST: 19 U/L (ref 0–37)
Albumin: 4.5 g/dL (ref 3.5–5.2)
Alkaline Phosphatase: 60 U/L (ref 39–117)
BILIRUBIN TOTAL: 0.8 mg/dL (ref 0.2–1.2)
BUN: 16 mg/dL (ref 6–23)
CO2: 27 meq/L (ref 19–32)
CREATININE: 1.06 mg/dL (ref 0.40–1.50)
Calcium: 9.7 mg/dL (ref 8.4–10.5)
Chloride: 104 mEq/L (ref 96–112)
GFR: 90.41 mL/min (ref >60.00)
GLUCOSE: 101 mg/dL — AB (ref 70–99)
Potassium: 3.5 mEq/L (ref 3.5–5.1)
SODIUM: 142 meq/L (ref 135–145)
Total Protein: 7.3 g/dL (ref 6.0–8.3)

## 2015-12-20 LAB — MICROALBUMIN / CREATININE URINE RATIO
Creatinine,U: 29.2 mg/dL
Microalb Creat Ratio: 2.4 mg/g (ref 0.0–30.0)
Microalb, Ur: <0.7 mg/dL (ref 0.0–1.9)

## 2015-12-26 ENCOUNTER — Encounter: Payer: Self-pay | Admitting: Endocrinology

## 2015-12-26 ENCOUNTER — Ambulatory Visit (INDEPENDENT_AMBULATORY_CARE_PROVIDER_SITE_OTHER): Payer: Managed Care, Other (non HMO) | Admitting: Endocrinology

## 2015-12-26 ENCOUNTER — Ambulatory Visit: Payer: PRIVATE HEALTH INSURANCE | Admitting: Endocrinology

## 2015-12-26 VITALS — BP 138/80 | HR 76 | Ht 65.0 in | Wt 193.0 lb

## 2015-12-26 DIAGNOSIS — E1169 Type 2 diabetes mellitus with other specified complication: Secondary | ICD-10-CM

## 2015-12-26 DIAGNOSIS — E669 Obesity, unspecified: Secondary | ICD-10-CM

## 2015-12-26 MED ORDER — GLIPIZIDE ER 5 MG PO TB24: 5.0000 mg | ORAL_TABLET | Freq: Every day | ORAL | 2 refills | Status: DC

## 2015-12-26 NOTE — Progress Notes (Signed)
Patient ID: Mike Parker, male   DOB: [birth year], 65 y.o.   MRN: [id number]           Reason for Appointment: Follow-up for Type 2 Diabetes  Referring physician: [place]   History of Present Illness:          Date of diagnosis of type 2 diabetes mellitus: ?  2011        Background history:   He appears to have been on glipizide and Janumet for several years with fair control His A1c had been over 7% since 2015 He was referred here because of an A1c of 9.2% done in 6/17  Recent history:   His A1c is dramatically better at 6.6 compared to 9.2 previously  Non-insulin hypoglycemic drugs the patient is taking are: Janumet 50/500 twice a day, glipizide ER 10 mg daily  Current management, blood sugar patterns and problems identified:  He was told to add metformin to his Janumet since he had a large supply of Janumet, however he ran out of metformin 3-4 weeks ago and has not taken it  His blood sugars however improved further and on an average they are looking better than before  He has lost some more weight since last visit  Has benefited from consultation with dietitian and is doing well on his diet, appears motivated  Also blood sugars are not fluctuating as much, previously relatively higher at times after meals  He is also trying to be active with walking or other activities  Occasional symptoms of hypoglycemia present with a couple of readings around 62 in the evenings  He is checking his blood sugars consistently at various times as directed       Side effects from medications have been: None  Compliance with the medical regimen: Inconsistent Hypoglycemia:   none  Glucose monitoring:  done 1 times a day         Glucometer: FreeStyle Blood Glucose readings by time of day and averages from meter download for the last 4 weeks:  Mean values apply above for all meters except median for One Touch  PRE-MEAL Fasting Lunch Dinner Bedtime Overall  Glucose range: 82-139    62-143  62-156    Mean/median: 111  115  102  120  110    Self-care:  Meal times are:  Breakfast is at 12 noon, dinner usually 7-8 PM Typical meal intake: Breakfast is  eggs, sausage, applesauce.  Lunch will be very do, hashbrowns.  Dinner is chicken or pork, greens, corn needed snacks usually nuts, fruits or chips               Dietician visit, most recent: 8/17               Exercise:  walking 45 minutes 3 times a week and mowing the lawn once or twice a week  Weight history:  Wt Readings from Last 3 Encounters:  12/26/15 193 lb (87.5 kg)  10/10/15 196 lb 4 oz (89 kg)  09/22/15 201 lb (91.2 kg)    Glycemic control:   Lab Results  Component Value Date   HGBA1C 6.6 (H) 10/10/2015   HGBA1C 9.2 (H) 07/07/2015   HGBA1C 7.0 (H) 01/13/2015   Lab Results  Component Value Date   MICROALBUR <0.7 12/20/2015   LDLCALC 18 07/07/2015   CREATININE 1.06 12/20/2015   Lab Results  Component Value Date   MICRALBCREAT 2.4 12/20/2015    Other problems discussed: See review of systems  Medication List       Accurate as of 12/26/15  1:42 PM. Always use your most recent med list.          amLODipine 10 MG tablet Commonly known as:  NORVASC TAKE 1 TABLET DAILY   aspirin 81 MG EC tablet Take 1 tablet (81 mg total) by mouth daily.   atorvastatin 10 MG tablet Commonly known as:  LIPITOR TAKE 1 TABLET AT SUPPER FOR LIPID LOWERING   blood glucose meter kit and supplies Dispense based on patient and insurance preference. Test twice daily; dx code: Z61.0   BYSTOLIC 10 MG tablet Generic drug:  nebivolol TAKE 1 TABLET DAILY   cloNIDine 0.1 MG tablet Commonly known as:  CATAPRES TAKE 1 TABLET TWICE A DAY (OVERDUE FOR AN APPOINTMENT PLEASE CALL AND SCHEDULE FOR FURTHER REFILLS)   econazole nitrate 1 % cream APPLY TOPICALLY DAILY   fluticasone 50 MCG/ACT nasal spray Commonly known as:  FLONASE Place 2 sprays into both nostrils daily as needed. Reported on 08/11/2015     freestyle lancets   furosemide 40 MG tablet Commonly known as:  LASIX TAKE 1 TABLET DAILY   glipiZIDE 5 MG 24 hr tablet Commonly known as:  GLIPIZIDE XL Take 1 tablet (5 mg total) by mouth daily.   glucose blood test strip Commonly known as:  ONETOUCH VERIO Use to check blood sugar 2 times per day. Dx Code. E11.9   LORazepam 1 MG tablet Commonly known as:  ATIVAN Take 1 tablet (1 mg total) by mouth at bedtime as needed.   losartan 100 MG tablet Commonly known as:  COZAAR TAKE 1 TABLET DAILY   SitaGLIPtin-MetFORMIN HCl 50-500 MG Tb24 Take 2 tablets daily       Allergies: No Known Allergies  Past Medical History:  Diagnosis Date  . Allergy   . Diabetes mellitus   . Hypertension   . Insomnia     Past Surgical History:  Procedure Laterality Date  . COLONOSCOPY    . ELBOW SURGERY     rt elbow  . HERNIA REPAIR     left inguinal  . KNEE SURGERY     rt and left    Family History  Problem Relation Age of Onset  . Stroke Mother   . Hypertension Brother   . Colon cancer Neg Hx   . Diabetes Neg Hx     Social History:  reports that he has never smoked. He has never used smokeless tobacco. He reports that he does not drink alcohol or use drugs.   Review of Systems   Lipid history: On treatment for about a year with Lipitor 10 mg daily from PCP    Lab Results  Component Value Date   CHOL 71 (L) 07/07/2015   HDL 37 (L) 07/07/2015   LDLCALC 18 07/07/2015   TRIG 82 07/07/2015   CHOLHDL 1.9 07/07/2015           Hypertension:Treated for >15 years  Most recent eye exam was 6/17  Most recent foot exam:7/17    LABS:  Lab on 12/20/2015  Component Date Value Ref Range Status  . Sodium 12/20/2015 142  135 - 145 mEq/L Final  . Potassium 12/20/2015 3.5  3.5 - 5.1 mEq/L Final  . Chloride 12/20/2015 104  96 - 112 mEq/L Final  . CO2 12/20/2015 27  19 - 32 mEq/L Final  . Glucose, Bld 12/20/2015 101* 70 - 99 mg/dL Final  . BUN 12/20/2015 16  6 - 23 mg/dL  Final  . Creatinine, Ser 12/20/2015 1.06  0.40 - 1.50 mg/dL Final  . Total Bilirubin 12/20/2015 0.8  0.2 - 1.2 mg/dL Final  . Alkaline Phosphatase 12/20/2015 60  39 - 117 U/L Final  . AST 12/20/2015 19  0 - 37 U/L Final  . ALT 12/20/2015 26  0 - 53 U/L Final  . Total Protein 12/20/2015 7.3  6.0 - 8.3 g/dL Final  . Albumin 12/20/2015 4.5  3.5 - 5.2 g/dL Final  . Calcium 12/20/2015 9.7  8.4 - 10.5 mg/dL Final  . GFR 12/20/2015 90.41  >60.00 mL/min Final  . Microalb, Ur 12/20/2015 <0.7  0.0 - 1.9 mg/dL Final  . Creatinine,U 12/20/2015 29.2  mg/dL Final  . Microalb Creat Ratio 12/20/2015 2.4  0.0 - 30.0 mg/g Final    Physical Examination:  BP 138/80   Pulse 76   Ht 5' 5"  (1.651 m)   Wt 193 lb (87.5 kg)   SpO2 97%   BMI 32.12 kg/m        ASSESSMENT:  Diabetes type 2 with BMI 33 See history of present illness for detailed discussion of current diabetes management, blood sugar patterns and problems identified  He has done very well with lifestyle changes and weight loss to improve his diabetes control He is taking low dose Janumet and not taking additional metformin as before Now blood sugars are relatively lower in the last couple of weeks and he has had a couple of mild hypoglycemic episodes in the evening also  Urine microalbumin ratio is normal   PLAN:    Reduce glipizide ER down to 5 mg  Continue Janumet unchanged  Continue trying to be active with various exercise activities  Consistent diet as before  Follow-up in 4 months  There are no Patient Instructions on file for this visit.    Alexandrya Chim 12/26/2015, 1:42 PM   Note: This office note was prepared with Dragon voice recognition system technology. Any transcriptional errors that result from this process are unintentional.

## 2016-01-15 ENCOUNTER — Other Ambulatory Visit: Payer: 59 | Admitting: Internal Medicine

## 2016-01-15 DIAGNOSIS — Z130 Encounter for screening for diseases of the blood and blood-forming organs and certain disorders involving the immune mechanism: Secondary | ICD-10-CM

## 2016-01-15 DIAGNOSIS — I10 Essential (primary) hypertension: Secondary | ICD-10-CM

## 2016-01-15 DIAGNOSIS — Z125 Encounter for screening for malignant neoplasm of prostate: Secondary | ICD-10-CM

## 2016-01-15 DIAGNOSIS — Z0000 Encounter for general adult medical examination without abnormal findings: Secondary | ICD-10-CM

## 2016-01-15 DIAGNOSIS — Z13220 Encounter for screening for lipoid disorders: Secondary | ICD-10-CM

## 2016-01-15 DIAGNOSIS — E119 Type 2 diabetes mellitus without complications: Secondary | ICD-10-CM

## 2016-01-15 LAB — CBC WITH DIFFERENTIAL/PLATELET
BASOS ABS: 0 {cells}/uL (ref 0–200)
Basophils Relative: 0 %
EOS ABS: 462 {cells}/uL (ref 15–500)
Eosinophils Relative: 6 %
HCT: 48 % (ref 38.5–50.0)
Hemoglobin: 15.4 g/dL (ref 13.2–17.1)
Lymphocytes Relative: 22 %
Lymphs Abs: 1694 cells/uL (ref 850–3900)
MCH: 26.4 pg — AB (ref 27.0–33.0)
MCHC: 32.1 g/dL (ref 32.0–36.0)
MCV: 82.3 fL (ref 80.0–100.0)
MONOS PCT: 9 %
MPV: 10.2 fL (ref 7.5–12.5)
Monocytes Absolute: 693 cells/uL (ref 200–950)
NEUTROS ABS: 4851 {cells}/uL (ref 1500–7800)
NEUTROS PCT: 63 %
PLATELETS: 239 10*3/uL (ref 140–400)
RBC: 5.83 MIL/uL — ABNORMAL HIGH (ref 4.20–5.80)
RDW: 14.8 % (ref 11.0–15.0)
WBC: 7.7 10*3/uL (ref 3.8–10.8)

## 2016-01-15 LAB — COMPLETE METABOLIC PANEL WITH GFR
ALT: 26 U/L (ref 9–46)
AST: 21 U/L (ref 10–35)
Albumin: 4.6 g/dL (ref 3.6–5.1)
Alkaline Phosphatase: 65 U/L (ref 40–115)
BUN: 14 mg/dL (ref 7–25)
CALCIUM: 10 mg/dL (ref 8.6–10.3)
CHLORIDE: 100 mmol/L (ref 98–110)
CO2: 31 mmol/L (ref 20–31)
CREATININE: 1.04 mg/dL (ref 0.70–1.25)
GFR, Est African American: 87 mL/min (ref >=60)
GFR, Est Non African American: 76 mL/min (ref >=60)
GLUCOSE: 121 mg/dL — AB (ref 65–99)
Potassium: 4.1 mmol/L (ref 3.5–5.3)
Sodium: 141 mmol/L (ref 135–146)
TOTAL PROTEIN: 7.4 g/dL (ref 6.1–8.1)
Total Bilirubin: 1 mg/dL (ref 0.2–1.2)

## 2016-01-15 LAB — LIPID PANEL
CHOL/HDL RATIO: 2.1 ratio (ref <5.0)
CHOLESTEROL: 75 mg/dL (ref <200)
HDL: 35 mg/dL — ABNORMAL LOW (ref >40)
LDL CALC: 26 mg/dL (ref <100)
Triglycerides: 68 mg/dL (ref <150)
VLDL: 14 mg/dL (ref <30)

## 2016-01-15 LAB — PSA: PSA: 1.1 ng/mL (ref <=4.0)

## 2016-01-16 LAB — HEMOGLOBIN A1C
HEMOGLOBIN A1C: 6.4 % — AB (ref <5.7)
MEAN PLASMA GLUCOSE: 137 mg/dL

## 2016-01-16 LAB — MICROALBUMIN, URINE: Microalb, Ur: 0.3 mg/dL

## 2016-01-18 ENCOUNTER — Ambulatory Visit (INDEPENDENT_AMBULATORY_CARE_PROVIDER_SITE_OTHER): Payer: 59 | Admitting: Internal Medicine

## 2016-01-18 ENCOUNTER — Encounter: Payer: Self-pay | Admitting: Internal Medicine

## 2016-01-18 VITALS — BP 128/76 | HR 71 | Temp 97.9°F | Ht 65.0 in | Wt 191.0 lb

## 2016-01-18 DIAGNOSIS — E8881 Metabolic syndrome: Secondary | ICD-10-CM | POA: Diagnosis not present

## 2016-01-18 DIAGNOSIS — I10 Essential (primary) hypertension: Secondary | ICD-10-CM

## 2016-01-18 DIAGNOSIS — Z0000 Encounter for general adult medical examination without abnormal findings: Secondary | ICD-10-CM | POA: Diagnosis not present

## 2016-01-18 DIAGNOSIS — Z8673 Personal history of transient ischemic attack (TIA), and cerebral infarction without residual deficits: Secondary | ICD-10-CM

## 2016-01-18 DIAGNOSIS — G4730 Sleep apnea, unspecified: Secondary | ICD-10-CM

## 2016-01-18 LAB — POCT URINALYSIS DIPSTICK
BILIRUBIN UA: NEGATIVE
Glucose, UA: NEGATIVE
KETONES UA: NEGATIVE
LEUKOCYTES UA: NEGATIVE
Nitrite, UA: NEGATIVE
PH UA: 6
PROTEIN UA: NEGATIVE
RBC UA: NEGATIVE
SPEC GRAV UA: 1.02
Urobilinogen, UA: NEGATIVE

## 2016-01-18 NOTE — Progress Notes (Signed)
   Subjective:    Patient ID: Mike Parker, male    DOB: [birth year], 65 y.o.   MRN: [id number]  HPI 65 year old Male for 6 month recheck.Sees Dr. Lucianne MussKumar who has his diabetes under excellent control. Hgb AIC is now 6.4 %. Has lost 17 pounds since June.Going to gym for exercise. Enjoying retirement. Watching diet. No new complaints.    Review of Systems see above     Objective:   Physical Exam Skin warm and dry. Nodes none. Neck is supple without JVD thyromegaly or carotid bruits. Chest clear to auscultation. Cardiac exam regular rate and rhythm normal S1 and S2. Extremities without edema. Diabetic foot exam reveals no ulcers or calluses. Pulses are normal.       Assessment & Plan:  Controlled type 2 diabetes mellitus without complication  Essential hypertension  Obesity  History of stroke  Sleep apnea  Plan: His diabetes is now under excellent control at 6.4%. Continue diet exercise and weight loss efforts. Return in 6 months for physical exam.

## 2016-01-22 ENCOUNTER — Other Ambulatory Visit: Payer: Self-pay | Admitting: Internal Medicine

## 2016-01-26 NOTE — Patient Instructions (Signed)
It was a pleasure to see you today. Congratulations on excellent diabetic control. Continue same medications and return for physical exam in 6 months.

## 2016-02-14 ENCOUNTER — Other Ambulatory Visit: Payer: Self-pay | Admitting: Endocrinology

## 2016-03-14 ENCOUNTER — Encounter: Payer: Self-pay | Admitting: Cardiology

## 2016-03-14 ENCOUNTER — Ambulatory Visit (INDEPENDENT_AMBULATORY_CARE_PROVIDER_SITE_OTHER): Payer: PRIVATE HEALTH INSURANCE | Admitting: Cardiology

## 2016-03-14 VITALS — BP 142/80 | HR 76 | Ht 65.0 in | Wt 191.0 lb

## 2016-03-14 DIAGNOSIS — Z8673 Personal history of transient ischemic attack (TIA), and cerebral infarction without residual deficits: Secondary | ICD-10-CM | POA: Diagnosis not present

## 2016-03-14 DIAGNOSIS — I10 Essential (primary) hypertension: Secondary | ICD-10-CM | POA: Diagnosis not present

## 2016-03-14 DIAGNOSIS — E785 Hyperlipidemia, unspecified: Secondary | ICD-10-CM

## 2016-03-14 DIAGNOSIS — I119 Hypertensive heart disease without heart failure: Secondary | ICD-10-CM

## 2016-03-14 NOTE — Progress Notes (Signed)
Patient ID: YUG LORIA, male   DOB: 17-Sep-1950, 66 y.o.   MRN: [id number]    Patient Name: Mike Parker Date of Encounter: 03/14/2016  Primary Care Provider:  Margaree Mackintosh, MD Primary Cardiologist:  Tobias Alexander  Problem List   Past Medical History:  Diagnosis Date  . Allergy   . Diabetes mellitus   . Hypertension   . Insomnia    Past Surgical History:  Procedure Laterality Date  . COLONOSCOPY    . ELBOW SURGERY     rt elbow  . HERNIA REPAIR     left inguinal  . KNEE SURGERY     rt and left   Allergies  No Known Allergies  HPI  A very pleasant 66 year old male with long-standing history of hypertension and type 2 diabetes mellitus. He had a left thalamic stroke June 2014 with almost complete recovery. His blood pressures been elevated since that time surrounding the stroke. He is on multiple BP medication regimen, recently his Atenolol was changed to Bystolic (nebivolol), lasix and amlodipine were added.   09/04/2015 - the patient is coming after 6 months, he has retired in February 2017, and admits that it's not being as active as he would like to be. He denies any symptoms of shortness of breath or chest pain, no lower extremity edema claudication or palpitations. However at the last PCP visit he was found to have elevated blood sugars and hemoglobin A1c of 9%. He has been referred to an endocrinologist but he didn't have good experience. He is otherwise enjoying his life with his grandchildren and overall feels good.  03/14/2016 - 6 months follow up, no complaints, BP has been well controlled, no new symptoms suspicious of stroke. No DOE, CP< palpitations, medications well tolerated.   Home Medications  Prior to Admission medications   Medication Sig Start Date End Date Taking? Authorizing Provider  amLODipine (NORVASC) 5 MG tablet TAKE 1 TABLET DAILY 07/11/12  Yes Margaree Mackintosh, MD  aspirin EC 81 MG EC tablet Take 1 tablet (81 mg total) by mouth daily. 07/15/12   Yes Nishant Dhungel, MD  atorvastatin (LIPITOR) 10 MG tablet  07/24/12  Yes Historical Provider, MD  fluticasone (FLONASE) 50 MCG/ACT nasal spray Place 2 sprays into both nostrils daily. 01/05/13  Yes Margaree Mackintosh, MD  FREESTYLE LITE test strip  11/23/11  Yes Historical Provider, MD  furosemide (LASIX) 40 MG tablet Take 1 tablet (40 mg total) by mouth daily. 09/14/12  Yes Margaree Mackintosh, MD  glipiZIDE (GLUCOTROL XL) 10 MG 24 hr tablet Take 1 tablet (10 mg total) by mouth daily. 03/20/12 03/20/13 Yes Margaree Mackintosh, MD  JANUMET 50-500 MG per tablet Take 2 tablets by mouth 2 (two) times daily with a meal. 07/15/12  Yes Nishant Dhungel, MD  Lancets (FREESTYLE) lancets  11/23/11  Yes Historical Provider, MD  LORazepam (ATIVAN) 1 MG tablet  07/24/12  Yes Historical Provider, MD  losartan (COZAAR) 100 MG tablet Take 1 tablet (100 mg total) by mouth daily. 09/14/12  Yes Margaree Mackintosh, MD  nebivolol (BYSTOLIC) 10 MG tablet Take 1 tablet (10 mg total) by mouth daily. 01/05/13  Yes Margaree Mackintosh, MD    Family History  Family History  Problem Relation Age of Onset  . Stroke Mother   . Hypertension Brother   . Colon cancer Neg Hx   . Diabetes Neg Hx     Social History  Social History   Social History  .  Marital status: Married    Spouse name: N/A  . Number of children: 2  . Years of education: BS   Occupational History  . 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 Smoking status: Never Smoker  . Smokeless tobacco: Never Used  . Alcohol use No  . Drug use: No  . Sexual activity: Yes   Other Topics Concern  . Not on file   Social History Narrative  . No narrative on file     Review of Systems, as per HPI, otherwise negative General:  No chills, fever, night sweats or weight changes.  Cardiovascular:  No chest pain, dyspnea on exertion, edema, orthopnea, palpitations, paroxysmal nocturnal dyspnea. Dermatological: No rash, lesions/masses Respiratory: No cough,  dyspnea Urologic: No hematuria, dysuria Abdominal:   No nausea, vomiting, diarrhea, bright red blood per rectum, melena, or hematemesis Neurologic:  No visual changes, wkns, changes in mental status. All other systems reviewed and are otherwise negative except as noted above.  Physical Exam  Blood pressure (!) 142/80, pulse 76, height 5\' 5"  (1.651 m), weight 191 lb (86.6 kg).  General: Pleasant, NAD Psych: Normal affect. Neuro: Alert and oriented X 3. Moves all extremities spontaneously. HEENT: Normal  Neck: Supple without bruits or JVD. Lungs:  Resp regular and unlabored, CTA. Heart: RRR no s3, s4, or murmurs. Abdomen: Soft, non-tender, non-distended, BS + x 4.  Extremities: No clubbing, cyanosis or edema. DP/PT/Radials 2+ and equal bilaterally.  Labs:  No results for input(s): CKTOTAL, CKMB, TROPONINI in the last 72 hours. Lab Results  Component Value Date   WBC 7.7 01/15/2016   HGB 15.4 01/15/2016   HCT 48.0 01/15/2016   MCV 82.3 01/15/2016   PLT 239 01/15/2016   No results for input(s): NA, K, CL, CO2, BUN, CREATININE, CALCIUM, PROT, BILITOT, ALKPHOS, ALT, AST, GLUCOSE in the last 168 hours.  Invalid input(s): LABALBU Lab Results  Component Value Date   CHOL 75 01/15/2016   HDL 35 (L) 01/15/2016   LDLCALC 26 01/15/2016   TRIG 68 01/15/2016   Accessory Clinical Findings  ECG - SR, normal ECG  Echocardiogram 07/11/2012  - Left ventricle: The cavity size was normal. There was moderate concentric hypertrophy. Systolic function was normal. The estimated ejection fraction was in the range of 60% to 65%. Wall motion was normal; there were no regional wall motion abnormalities. - Left atrium: The atrium was mildly dilated.  EKG performed on 09/14/2015 showed normal sinus rhythm normal ECG and unchanged from prior.   Assessment & Plan  A pleasant 66 year old male with h/o NIDDM and thalamic stroke in June 2014  1. Resistant hypertension - with h/o stroke and an  evidence of moderate LVH with diastolic dysfunction on echocardiogram. A renal arterial duplex was negative for stenosis, both kidneys are normal size. Carotid ultrasound normal in 2014, no new symptoms, no bruit no need to repeat. BP controlled on current regimen that is well tolerated.   2. Hyperlipidemia - on atorvastatin 10 mg po daily, well tolerated, all of his lipids at goal in December 2017 for primary CAD prevention.  3. DM - HbA1c 9.2%, he is advised to find a new endocrinologist.  Follow up in 1 year.   Tobias AlexanderKatarina Shyheim Tanney, MD, [facility] Way[facility]  03/14/2016, 11:12 AM

## 2016-03-14 NOTE — Patient Instructions (Signed)
Medication Instructions:   Your physician recommends that you continue on your current medications as directed. Please refer to the Current Medication list given to you today.    Follow-Up:  Your physician wants you to follow-up in: ONE YEAR WITH DR NELSON You will receive a reminder letter in the mail two months in advance. If you Hersh't receive a letter, please call our office to schedule the follow-up appointment.        If you need a refill on your cardiac medications before your next appointment, please call your pharmacy.   

## 2016-03-20 ENCOUNTER — Ambulatory Visit (INDEPENDENT_AMBULATORY_CARE_PROVIDER_SITE_OTHER): Payer: 59 | Admitting: Emergency Medicine

## 2016-03-20 VITALS — BP 122/74 | HR 76 | Temp 98.3°F | Resp 16 | Ht 65.0 in | Wt 191.0 lb

## 2016-03-20 DIAGNOSIS — H578 Other specified disorders of eye and adnexa: Secondary | ICD-10-CM | POA: Diagnosis not present

## 2016-03-20 DIAGNOSIS — H1032 Unspecified acute conjunctivitis, left eye: Secondary | ICD-10-CM | POA: Diagnosis not present

## 2016-03-20 DIAGNOSIS — E119 Type 2 diabetes mellitus without complications: Secondary | ICD-10-CM

## 2016-03-20 DIAGNOSIS — H5789 Other specified disorders of eye and adnexa: Secondary | ICD-10-CM

## 2016-03-20 MED ORDER — TOBRAMYCIN 0.3 % OP SOLN: 2.0000 [drp] | OPHTHALMIC | 1 refills | Status: AC

## 2016-03-20 NOTE — Progress Notes (Signed)
Mike Parker 66 y.o.   Chief Complaint  Patient presents with  . Eye Problem    Pink, Itchy, x 5 days    HISTORY OF PRESENT ILLNESS: This is a 66 y.o. male complaining of red and itchy left eye  X 4-5 days.  Eye Problem   The left eye is affected. This is a new problem. The current episode started in the past 7 days. The problem occurs constantly. The problem has been gradually worsening. There was no injury mechanism. The pain is at a severity of 2/10. The pain is mild. He does not wear contacts. Associated symptoms include an eye discharge and eye redness. Pertinent negatives include no blurred vision, double vision, fever, foreign body sensation, nausea, photophobia, recent URI or vomiting. He has tried nothing for the symptoms.     Prior to Admission medications   Medication Sig Start Date End Date Taking? Authorizing Provider  amLODipine (NORVASC) 10 MG tablet TAKE 1 TABLET DAILY 12/16/15  Yes Elby Showers, MD  aspirin EC 81 MG EC tablet Take 1 tablet (81 mg total) by mouth daily. 07/15/12  Yes Nishant Dhungel, MD  atorvastatin (LIPITOR) 10 MG tablet TAKE 1 TABLET AT SUPPER FOR LIPID LOWERING 12/16/15  Yes Elby Showers, MD  blood glucose meter kit and supplies Dispense based on patient and insurance preference. Test twice daily; dx code: E11.9 07/17/15  Yes Elby Showers, MD  BYSTOLIC 10 MG tablet TAKE 1 TABLET DAILY 01/23/16  Yes Elby Showers, MD  cloNIDine (CATAPRES) 0.1 MG tablet TAKE 1 TABLET TWICE A DAY (OVERDUE FOR AN APPOINTMENT PLEASE CALL AND SCHEDULE FOR FURTHER REFILLS) 04/04/15  Yes Dorothy Spark, MD  econazole nitrate 1 % cream APPLY TOPICALLY DAILY 06/05/15  Yes Elby Showers, MD  fluticasone (FLONASE) 50 MCG/ACT nasal spray Place 2 sprays into both nostrils daily as needed. Reported on 08/11/2015 01/05/13  Yes Elby Showers, MD  furosemide (LASIX) 40 MG tablet TAKE 1 TABLET DAILY 12/16/15  Yes Elby Showers, MD  glipiZIDE (GLUCOTROL XL) 5 MG 24 hr tablet Take 1 tablet  (5 mg total) by mouth daily. 12/26/15  Yes Elayne Snare, MD  glucose blood (ONETOUCH VERIO) test strip Use to check blood sugar 2 times per day. Dx Code. E11.9 10/03/15  Yes Elayne Snare, MD  JANUMET XR 50-500 MG TB24 TAKE 2 TABLETS DAILY 02/15/16  Yes Elayne Snare, MD  Lancets (FREESTYLE) lancets  11/23/11  Yes Historical Provider, MD  LORazepam (ATIVAN) 1 MG tablet Take 1 tablet (1 mg total) by mouth at bedtime as needed. 08/22/15  Yes Elby Showers, MD  losartan (COZAAR) 100 MG tablet TAKE 1 TABLET DAILY 12/16/15  Yes Elby Showers, MD    No Known Allergies  Patient Active Problem List   Diagnosis Date Noted  . Metabolic syndrome 21/19/4174  . Left knee pain 01/16/2015  . Paresthesias in right hand 01/16/2015  . OSA (obstructive sleep apnea) 12/06/2013  . Essential hypertension, malignant 12/06/2013  . Essential hypertension 05/19/2013  . Numbness and tingling 07/14/2012  . CVA (cerebral infarction) 07/14/2012  . Obesity 01/02/2011  . Hypertension 06/29/2010  . Diabetes mellitus ([place]) 06/29/2010    Past Medical History:  Diagnosis Date  . Allergy   . Diabetes mellitus   . Hypertension   . Insomnia     Past Surgical History:  Procedure Laterality Date  . COLONOSCOPY    . ELBOW SURGERY     rt elbow  .  HERNIA REPAIR     left inguinal  . KNEE SURGERY     rt and left    Social History   Social History  . Marital status: Married    Spouse name: N/A  . Number of children: 2  . Years of education: BS   Occupational History  . 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 Smoking status: Never Smoker  . Smokeless tobacco: Never Used  . Alcohol use No  . Drug use: No  . Sexual activity: Yes   Other Topics Concern  . Not on file   Social History Narrative  . No narrative on file    Family History  Problem Relation Age of Onset  . Stroke Mother   . Hypertension Brother   . Colon cancer Neg Hx   . Diabetes Neg Hx      Review of Systems    Constitutional: Negative for fever.  HENT: Negative.   Eyes: Positive for discharge and redness. Negative for blurred vision, double vision and photophobia.  Respiratory: Negative for shortness of breath.   Cardiovascular: Negative for chest pain and palpitations.  Gastrointestinal: Negative for nausea and vomiting.  Genitourinary: Negative for hematuria.  Musculoskeletal: Negative for myalgias and neck pain.  Skin: Negative.   Neurological: Negative for dizziness, sensory change, focal weakness and headaches.  All other systems reviewed and are negative.  Vitals:   03/20/16 0810  BP: 122/74  Pulse: 76  Resp: 16  Temp: 98.3 F (36.8 C)     Physical Exam  Constitutional: He is oriented to person, place, and time. He appears well-developed and well-nourished.  HENT:  Head: Normocephalic and atraumatic.  Nose: Nose normal.  Mouth/Throat: Oropharynx is clear and moist. No oropharyngeal exudate.  Eyes: EOM are normal. Pupils are equal, round, and reactive to light. Left eye exhibits exudate. Left conjunctiva is injected.  Neck: Normal range of motion. Neck supple.  Cardiovascular: Normal rate, regular rhythm and normal heart sounds.   Pulmonary/Chest: Effort normal and breath sounds normal.  Musculoskeletal: Normal range of motion.  Lymphadenopathy:    He has no cervical adenopathy.  Neurological: He is alert and oriented to person, place, and time. No sensory deficit. He exhibits normal muscle tone.  Skin: Skin is warm and dry. Capillary refill takes less than 2 seconds.  Psychiatric: He has a normal mood and affect. His behavior is normal.  Vitals reviewed.    ASSESSMENT & PLAN: Zen was seen today for eye problem.  Diagnoses and all orders for this visit:  Acute conjunctivitis of left eye, unspecified acute conjunctivitis type  Itch of eye, left  Redness of left eye  Type 2 diabetes mellitus without complication, without long-term current use of insulin ([place]) -      Ambulatory referral to Ophthalmology  Other orders -     tobramycin (TOBREX) 0.3 % ophthalmic solution; Place 2 drops into both eyes every 4 (four) hours.    Patient Instructions       IF you received an x-ray today, you will receive an invoice from [facility]. Please contact [facility] at [phone] with questions or concerns regarding your invoice.   IF you received labwork today, you will receive an invoice from [facility]. Please contact [facility] at [phone] with questions or concerns regarding your invoice.   Our billing staff will not be able to assist you with questions regarding bills from these companies.  You will be contacted with the lab results as soon  as they are available. The fastest way to get your results is to activate your My Chart account. Instructions are located on the last page of this paperwork. If you have not heard from [place] regarding the results in 2 weeks, please contact this office.     Bacterial Conjunctivitis Introduction Bacterial conjunctivitis is an infection of your conjunctiva. This is the clear membrane that covers the white part of your eye and the inner surface of your eyelid. This condition can make your eye:  Red or pink.  Itchy. This condition is caused by bacteria. This condition spreads very easily from person to person (is contagious) and from one eye to the other eye. Follow these instructions at home: Medicines  Take or apply your antibiotic medicine as told by your doctor. Do not stop taking or applying the antibiotic even if you start to feel better.  Take or apply over-the-counter and prescription medicines only as told by your doctor.  Do not touch your eyelid with the eye drop bottle or the ointment tube. Managing discomfort  Wipe any fluid from your eye with a warm, wet washcloth or a cotton ball.  Place a cool, clean washcloth on your eye. Do this for 10-20 minutes, 3-4 times per day. General  instructions  Do not wear contact lenses until the irritation is gone. Wear glasses until your doctor says it is okay to wear contacts.  Do not wear eye makeup until your symptoms are gone. Throw away any old makeup.  Change or wash your pillowcase every day.  Do not share towels or washcloths with anyone.  Wash your hands often with soap and water. Use paper towels to dry your hands.  Do not touch or rub your eyes.  Do not drive or use heavy machinery if your vision is blurry. Contact a doctor if:  You have a fever.  Your symptoms do not get better after 10 days. Get help right away if:  You have a fever and your symptoms suddenly get worse.  You have very bad pain when you move your eye.  Your face:  Hurts.  Is red.  Is swollen.  You have sudden loss of vision. This information is not intended to replace advice given to you by your health care provider. Make sure you discuss any questions you have with your health care provider. Document Released: 10/24/2007 Document Revised: 06/22/2015 Document Reviewed: 10/27/2014  2017 Elsevier     Agustina Caroli, MD [facility]

## 2016-03-20 NOTE — Patient Instructions (Addendum)
     IF you received an x-ray today, you will receive an invoice from [facility]. Please contact [facility] at [phone] with questions or concerns regarding your invoice.   IF you received labwork today, you will receive an invoice from [facility]. Please contact [facility] at [phone] with questions or concerns regarding your invoice.   Our billing staff will not be able to assist you with questions regarding bills from these companies.  You will be contacted with the lab results as soon as they are available. The fastest way to get your results is to activate your My Chart account. Instructions are located on the last page of this paperwork. If you have not heard from us regarding the results in 2 weeks, please contact this office.     Bacterial Conjunctivitis Introduction Bacterial conjunctivitis is an infection of your conjunctiva. This is the clear membrane that covers the white part of your eye and the inner surface of your eyelid. This condition can make your eye:  Red or pink.  Itchy. This condition is caused by bacteria. This condition spreads very easily from person to person (is contagious) and from one eye to the other eye. Follow these instructions at home: Medicines  Take or apply your antibiotic medicine as told by your doctor. Do not stop taking or applying the antibiotic even if you start to feel better.  Take or apply over-the-counter and prescription medicines only as told by your doctor.  Do not touch your eyelid with the eye drop bottle or the ointment tube. Managing discomfort  Wipe any fluid from your eye with a warm, wet washcloth or a cotton ball.  Place a cool, clean washcloth on your eye. Do this for 10-20 minutes, 3-4 times per day. General instructions  Do not wear contact lenses until the irritation is gone. Wear glasses until your doctor says it is okay to wear contacts.  Do not wear eye makeup until your symptoms are gone.  Throw away any old makeup.  Change or wash your pillowcase every day.  Do not share towels or washcloths with anyone.  Wash your hands often with soap and water. Use paper towels to dry your hands.  Do not touch or rub your eyes.  Do not drive or use heavy machinery if your vision is blurry. Contact a doctor if:  You have a fever.  Your symptoms do not get better after 10 days. Get help right away if:  You have a fever and your symptoms suddenly get worse.  You have very bad pain when you move your eye.  Your face:  Hurts.  Is red.  Is swollen.  You have sudden loss of vision. This information is not intended to replace advice given to you by your health care provider. Make sure you discuss any questions you have with your health care provider. Document Released: 10/24/2007 Document Revised: 06/22/2015 Document Reviewed: 10/27/2014  2017 Elsevier  

## 2016-03-22 ENCOUNTER — Other Ambulatory Visit (INDEPENDENT_AMBULATORY_CARE_PROVIDER_SITE_OTHER): Payer: PRIVATE HEALTH INSURANCE

## 2016-03-22 DIAGNOSIS — E669 Obesity, unspecified: Secondary | ICD-10-CM | POA: Diagnosis not present

## 2016-03-22 DIAGNOSIS — E1169 Type 2 diabetes mellitus with other specified complication: Secondary | ICD-10-CM

## 2016-03-22 LAB — BASIC METABOLIC PANEL
BUN: 13 mg/dL (ref 6–23)
CHLORIDE: 102 meq/L (ref 96–112)
CO2: 28 meq/L (ref 19–32)
Calcium: 9.4 mg/dL (ref 8.4–10.5)
Creatinine, Ser: 1.02 mg/dL (ref 0.40–1.50)
GFR: 94.44 mL/min (ref >60.00)
GLUCOSE: 86 mg/dL (ref 70–99)
POTASSIUM: 3.7 meq/L (ref 3.5–5.1)
SODIUM: 140 meq/L (ref 135–145)

## 2016-03-22 LAB — HEMOGLOBIN A1C: Hgb A1c MFr Bld: 6.5 % (ref 4.6–6.5)

## 2016-03-27 ENCOUNTER — Encounter: Payer: Self-pay | Admitting: Endocrinology

## 2016-03-27 ENCOUNTER — Ambulatory Visit: Payer: PRIVATE HEALTH INSURANCE | Admitting: Endocrinology

## 2016-03-27 ENCOUNTER — Ambulatory Visit (INDEPENDENT_AMBULATORY_CARE_PROVIDER_SITE_OTHER): Payer: Managed Care, Other (non HMO) | Admitting: Endocrinology

## 2016-03-27 VITALS — BP 136/76 | HR 67 | Ht 65.0 in | Wt 189.0 lb

## 2016-03-27 DIAGNOSIS — E1165 Type 2 diabetes mellitus with hyperglycemia: Secondary | ICD-10-CM

## 2016-03-27 NOTE — Patient Instructions (Signed)
Check blood sugars on waking up  2x weekly  Also check blood sugars about 2 hours after a meal and do this after different meals by rotation  Recommended blood sugar levels on waking up is 90-130 and about 2 hours after meal is 130-160  Please bring your blood sugar monitor to each visit, thank you  

## 2016-03-27 NOTE — Progress Notes (Signed)
Patient ID: Mike Parker, male   DOB: 1950/05/29, 66 y.o.   MRN: [id number]           Reason for Appointment: Follow-up for Type 2 Diabetes  Referring physician: [facility]   History of Present Illness:          Date of diagnosis of type 2 diabetes mellitus: ?  2011        Background history:   He appears to have been on glipizide and Janumet for several years with fair control His A1c had been over 7% since 2015 He was referred here because of an A1c of 9.2% done in 6/17  Recent history:   His A1c is stable at 6.5, previously range 6.4-6.6  Non-insulin hypoglycemic drugs the patient is taking are: Janumet 50/500 twice a day, glipizide ER 5 mg daily  Current management, blood sugar patterns and problems identified:  He was told to reduce his glipizide ER to 5 mg on the last visit   He still continues to take low-dose Janumet twice a day  Did not bring his monitor for download  Most of his readings after meals are reportedly fairly good and lab glucose was 90 fasting  He had only one high reading over 200 after going off her diet  Has not been able to walk as much lately because of winter weather  Very rare symptoms of hypoglycemia present with a couple of readings in the 60s, lowest 64  He is checking his blood sugars consistently at various times as directed but did not bring his monitor       Side effects from medications have been: None  Compliance with the medical regimen: Inconsistent Hypoglycemia:   none  Glucose monitoring:  done 1 times a day         Glucometer: FreeStyle Blood Glucose readings by recall  Mean values apply above for all meters except median for One Touch  PRE-MEAL Fasting Lunch Dinner Bedtime Overall  Glucose range: 100-120  64 124-222   Mean/median:         Self-care:  Meal times are:  Breakfast is at 12 noon, dinner usually 7-8 PM Typical meal intake: Breakfast is  eggs, sausage, applesauce.  Lunch will be very do, hashbrowns.  Dinner  is chicken or pork, greens, corn needed snacks usually nuts, fruits or chips               Dietician visit, most recent: 8/17               Exercise:  walking 45 minutes 0-3 times a week   Weight history:  Wt Readings from Last 3 Encounters:  03/27/16 189 lb (85.7 kg)  03/20/16 191 lb (86.6 kg)  03/14/16 191 lb (86.6 kg)    Glycemic control:   Lab Results  Component Value Date   HGBA1C 6.5 03/22/2016   HGBA1C 6.4 (H) 01/15/2016   HGBA1C 6.6 (H) 10/10/2015   Lab Results  Component Value Date   MICROALBUR 0.3 01/15/2016   LDLCALC 26 01/15/2016   CREATININE 1.02 03/22/2016   Lab Results  Component Value Date   MICRALBCREAT 2.4 12/20/2015    Other problems discussed: See review of systems    Allergies as of 03/27/2016   No Known Allergies     Medication List       Accurate as of 03/27/16 10:28 AM. Always use your most recent med list.          amLODipine 10 MG tablet  Commonly known as:  NORVASC TAKE 1 TABLET DAILY   aspirin 81 MG EC tablet Take 1 tablet (81 mg total) by mouth daily.   atorvastatin 10 MG tablet Commonly known as:  LIPITOR TAKE 1 TABLET AT SUPPER FOR LIPID LOWERING   blood glucose meter kit and supplies Dispense based on patient and insurance preference. Test twice daily; dx code: K91.7   BYSTOLIC 10 MG tablet Generic drug:  nebivolol TAKE 1 TABLET DAILY   cloNIDine 0.1 MG tablet Commonly known as:  CATAPRES TAKE 1 TABLET TWICE A DAY (OVERDUE FOR AN APPOINTMENT PLEASE CALL AND SCHEDULE FOR FURTHER REFILLS)   econazole nitrate 1 % cream APPLY TOPICALLY DAILY   fluticasone 50 MCG/ACT nasal spray Commonly known as:  FLONASE Place 2 sprays into both nostrils daily as needed. Reported on 08/11/2015   freestyle lancets   furosemide 40 MG tablet Commonly known as:  LASIX TAKE 1 TABLET DAILY   glipiZIDE 5 MG 24 hr tablet Commonly known as:  GLIPIZIDE XL Take 1 tablet (5 mg total) by mouth daily.   glucose blood test  strip Commonly known as:  ONETOUCH VERIO Use to check blood sugar 2 times per day. Dx Code. E11.9   JANUMET XR 50-500 MG Tb24 Generic drug:  SitaGLIPtin-MetFORMIN HCl TAKE 2 TABLETS DAILY   LORazepam 1 MG tablet Commonly known as:  ATIVAN Take 1 tablet (1 mg total) by mouth at bedtime as needed.   losartan 100 MG tablet Commonly known as:  COZAAR TAKE 1 TABLET DAILY   tobramycin 0.3 % ophthalmic solution Commonly known as:  TOBREX Place 2 drops into both eyes every 4 (four) hours.       Allergies: No Known Allergies  Past Medical History:  Diagnosis Date  . Allergy   . Diabetes mellitus   . Hypertension   . Insomnia     Past Surgical History:  Procedure Laterality Date  . COLONOSCOPY    . ELBOW SURGERY     rt elbow  . HERNIA REPAIR     left inguinal  . KNEE SURGERY     rt and left    Family History  Problem Relation Age of Onset  . Stroke Mother   . Hypertension Brother   . Colon cancer Neg Hx   . Diabetes Neg Hx     Social History:  reports that he has never smoked. He has never used smokeless tobacco. He reports that he does not drink alcohol or use drugs.   Review of Systems   Lipid history: On treatment for about a year with Lipitor 10 mg daily from PCP    Lab Results  Component Value Date   CHOL 75 01/15/2016   HDL 35 (L) 01/15/2016   LDLCALC 26 01/15/2016   TRIG 68 01/15/2016   CHOLHDL 2.1 01/15/2016           Hypertension:Treated for >15 years  Most recent eye exam was 6/17  Most recent foot exam:7/17    LABS:  Lab on 03/22/2016  Component Date Value Ref Range Status  . Hgb A1c MFr Bld 03/22/2016 6.5  4.6 - 6.5 % Final  . Sodium 03/22/2016 140  135 - 145 mEq/L Final  . Potassium 03/22/2016 3.7  3.5 - 5.1 mEq/L Final  . Chloride 03/22/2016 102  96 - 112 mEq/L Final  . CO2 03/22/2016 28  19 - 32 mEq/L Final  . Glucose, Bld 03/22/2016 86  70 - 99 mg/dL Final  . BUN 03/22/2016 13  6 - 23 mg/dL Final  . Creatinine, Ser  03/22/2016 1.02  0.40 - 1.50 mg/dL Final  . Calcium 03/22/2016 9.4  8.4 - 10.5 mg/dL Final  . GFR 03/22/2016 94.44  >60.00 mL/min Final    Physical Examination:  BP 136/76   Pulse 67   Ht 5' 5"  (1.651 m)   Wt 189 lb (85.7 kg)   SpO2 97%   BMI 31.45 kg/m        ASSESSMENT:  Diabetes type 2 with BMI 33 See history of present illness for detailed discussion of current diabetes management, blood sugar patterns and problems identified  He has Fairly good A1c of 6.5 Although he did not bring his monitor for download the blood sugars are usually well-controlled Still requiring glipizide as the lowest blood sugars are only rare and only once he thinks he was symptomatic with a reading of 64 He has not exercised as much this winter and should be able to do better Weight is stable.  PLAN:    No change in medications as yet  Call if having unusually high or low sugars  Consistent exercise  Patient Instructions  Check blood sugars on waking up 2x weekly  Also check blood sugars about 2 hours after a meal and do this after different meals by rotation  Recommended blood sugar levels on waking up is 90-130 and about 2 hours after meal is 130-160  Please bring your blood sugar monitor to each visit, thank you      [facility] 03/27/2016, 10:28 AM   Note: This office note was prepared with Dragon voice recognition system technology. Any transcriptional errors that result from this process are unintentional.

## 2016-03-29 ENCOUNTER — Other Ambulatory Visit: Payer: Self-pay | Admitting: Cardiology

## 2016-04-29 ENCOUNTER — Other Ambulatory Visit: Payer: Self-pay | Admitting: Endocrinology

## 2016-05-30 ENCOUNTER — Other Ambulatory Visit: Payer: Self-pay | Admitting: Endocrinology

## 2016-06-12 LAB — HM DIABETES EYE EXAM

## 2016-06-21 ENCOUNTER — Other Ambulatory Visit (INDEPENDENT_AMBULATORY_CARE_PROVIDER_SITE_OTHER): Payer: PRIVATE HEALTH INSURANCE

## 2016-06-21 DIAGNOSIS — E1165 Type 2 diabetes mellitus with hyperglycemia: Secondary | ICD-10-CM

## 2016-06-21 LAB — COMPREHENSIVE METABOLIC PANEL
ALK PHOS: 51 U/L (ref 39–117)
ALT: 18 U/L (ref 0–53)
AST: 17 U/L (ref 0–37)
Albumin: 4.4 g/dL (ref 3.5–5.2)
BILIRUBIN TOTAL: 0.7 mg/dL (ref 0.2–1.2)
BUN: 13 mg/dL (ref 6–23)
CO2: 29 mEq/L (ref 19–32)
CREATININE: 1.01 mg/dL (ref 0.40–1.50)
Calcium: 9.7 mg/dL (ref 8.4–10.5)
Chloride: 105 mEq/L (ref 96–112)
GFR: 95.44 mL/min (ref >60.00)
GLUCOSE: 92 mg/dL (ref 70–99)
Potassium: 4.4 mEq/L (ref 3.5–5.1)
SODIUM: 141 meq/L (ref 135–145)
TOTAL PROTEIN: 7 g/dL (ref 6.0–8.3)

## 2016-06-21 LAB — HEMOGLOBIN A1C: HEMOGLOBIN A1C: 6.6 % — AB (ref 4.6–6.5)

## 2016-06-26 ENCOUNTER — Encounter: Payer: Self-pay | Admitting: Endocrinology

## 2016-06-26 ENCOUNTER — Ambulatory Visit (INDEPENDENT_AMBULATORY_CARE_PROVIDER_SITE_OTHER): Payer: PRIVATE HEALTH INSURANCE | Admitting: Endocrinology

## 2016-06-26 VITALS — BP 150/86 | HR 82 | Ht 65.0 in | Wt 190.2 lb

## 2016-06-26 DIAGNOSIS — E1165 Type 2 diabetes mellitus with hyperglycemia: Secondary | ICD-10-CM

## 2016-06-26 MED ORDER — GLIPIZIDE ER 2.5 MG PO TB24: 2.5000 mg | ORAL_TABLET | Freq: Every day | ORAL | 3 refills | Status: DC

## 2016-06-26 NOTE — Patient Instructions (Signed)
Check blood sugars on waking up  2-3/7  Also check blood sugars about 2 hours after a meal and do this after different meals by rotation  Recommended blood sugar levels on waking up is 90-130 and about 2 hours after meal is 130-160  Please bring your blood sugar monitor to each visit, thank you  Reduce Glipizide dose

## 2016-06-26 NOTE — Progress Notes (Signed)
Patient ID: Mike Parker, male   DOB: May 23, 1950, 66 y.o.   MRN: [id number]           Reason for Appointment: Follow-up for Type 2 Diabetes  Referring physician: [place]   History of Present Illness:          Date of diagnosis of type 2 diabetes mellitus: ?  2011        Background history:   He appears to have been on glipizide and Janumet for several years with fair control His A1c had been over 7% since 2015 He was referred here because of an A1c of 9.2% done in 6/17  Recent history:   His A1c is stable at 6.5, previously range 6.4-6.6  Non-insulin hypoglycemic drugs the patient is taking are: Janumet 50/500 xr twice a day, glipizide ER 5 mg daily  Current management, blood sugar patterns and problems identified:   Most of his readings after meals are looking fairly good on his monitor download  He does not feel hypoglycemic even though he has a couple of readings in the 60s  Since his last visit he has had fairly consistent readings after meals and usually not going above target  Has only one relatively high fasting reading of 163 but otherwise they are fairly good  Checking blood sugar at least once a day, he thinks he does better with his lifestyle if he knows what his blood sugars are  He is trying to be more active with lawnmowing or walking       Side effects from medications have been: None  Compliance with the medical regimen: Improved  Glucose monitoring:  done 1 times a day         Glucometer: One Touch Blood Glucose readings by   Mean values apply above for all meters except median for One Touch  PRE-MEAL Fasting Lunch Dinner Bedtime Overall  Glucose range: 84-1 63   68-1 27  74-1 48    Mean/median: 103  64-1 19  96  118  106   POST-MEAL PC Breakfast PC Lunch PC Dinner  Glucose range:     Mean/median: 127      Self-care:  Meal times are:  Breakfast is at 12 noon, dinner usually 7-8 PM Typical meal intake: Breakfast is  eggs, sausage, applesauce.   Lunch will be very do, hashbrowns.  Dinner is chicken or pork, greens, corn needed snacks usually nuts, fruits or chips               Dietician visit, most recent: 8/17               Exercise:  walking 45 minutes 1-3 times a week, lawnmowing   Weight history:  Wt Readings from Last 3 Encounters:  06/26/16 190 lb 3.2 oz (86.3 kg)  03/27/16 189 lb (85.7 kg)  03/20/16 191 lb (86.6 kg)    Glycemic control:   Lab Results  Component Value Date   HGBA1C 6.6 (H) 06/21/2016   HGBA1C 6.5 03/22/2016   HGBA1C 6.4 (H) 01/15/2016   Lab Results  Component Value Date   MICROALBUR 0.3 01/15/2016   LDLCALC 26 01/15/2016   CREATININE 1.01 06/21/2016   Lab Results  Component Value Date   MICRALBCREAT 2.4 12/20/2015    Other problems discussed: See review of systems    Allergies as of 06/26/2016   No Known Allergies     Medication List       Accurate as of 06/26/16 11:35 AM.  Always use your most recent med list.          amLODipine 10 MG tablet Commonly known as:  NORVASC TAKE 1 TABLET DAILY   aspirin 81 MG EC tablet Take 1 tablet (81 mg total) by mouth daily.   atorvastatin 10 MG tablet Commonly known as:  LIPITOR TAKE 1 TABLET AT SUPPER FOR LIPID LOWERING   blood glucose meter kit and supplies Dispense based on patient and insurance preference. Test twice daily; dx code: B34.1   BYSTOLIC 10 MG tablet Generic drug:  nebivolol TAKE 1 TABLET DAILY   cloNIDine 0.1 MG tablet Commonly known as:  CATAPRES Take 1 tablet (0.1 mg total) by mouth 2 (two) times daily.   econazole nitrate 1 % cream APPLY TOPICALLY DAILY   fluticasone 50 MCG/ACT nasal spray Commonly known as:  FLONASE Place 2 sprays into both nostrils daily as needed. Reported on 08/11/2015   freestyle lancets   furosemide 40 MG tablet Commonly known as:  LASIX TAKE 1 TABLET DAILY   glipiZIDE 2.5 MG 24 hr tablet Commonly known as:  GLUCOTROL XL Take 1 tablet (2.5 mg total) by mouth daily with  breakfast.   JANUMET XR 50-500 MG Tb24 Generic drug:  SitaGLIPtin-MetFORMIN HCl TAKE 2 TABLETS DAILY   LORazepam 1 MG tablet Commonly known as:  ATIVAN Take 1 tablet (1 mg total) by mouth at bedtime as needed.   losartan 100 MG tablet Commonly known as:  COZAAR TAKE 1 TABLET DAILY   ONETOUCH VERIO test strip Generic drug:  glucose blood USE TO CHECK BLOOD SUGAR TWICE A DAY       Allergies: No Known Allergies  Past Medical History:  Diagnosis Date  . Allergy   . Diabetes mellitus   . Hypertension   . Insomnia     Past Surgical History:  Procedure Laterality Date  . COLONOSCOPY    . ELBOW SURGERY     rt elbow  . HERNIA REPAIR     left inguinal  . KNEE SURGERY     rt and left    Family History  Problem Relation Age of Onset  . Stroke Mother   . Hypertension Brother   . Colon cancer Neg Hx   . Diabetes Neg Hx     Social History:  reports that he has never smoked. He has never used smokeless tobacco. He reports that he does not drink alcohol or use drugs.   Review of Systems   Lipid history: On treatment for about a year with Lipitor 10 mg daily from PCP    Lab Results  Component Value Date   CHOL 75 01/15/2016   HDL 35 (L) 01/15/2016   LDLCALC 26 01/15/2016   TRIG 68 01/15/2016   CHOLHDL 2.1 01/15/2016           Hypertension:Treated for >15 years He did not take his clonidine this morning which he normally takes at 6 AM  BP Readings from Last 3 Encounters:  06/26/16 (!) 150/86  03/27/16 136/76  03/20/16 122/74     Most recent eye exam was 6/17  Most recent foot exam:7/17    LABS:  Lab on 06/21/2016  Component Date Value Ref Range Status  . Hgb A1c MFr Bld 06/21/2016 6.6* 4.6 - 6.5 % Final   Glycemic Control Guidelines for People with Diabetes:Non Diabetic:  <6%Goal of Therapy: <7%Additional Action Suggested:  >8%   . Sodium 06/21/2016 141  135 - 145 mEq/L Final  . Potassium 06/21/2016 4.4  3.5 - 5.1 mEq/L Final  . Chloride  06/21/2016 105  96 - 112 mEq/L Final  . CO2 06/21/2016 29  19 - 32 mEq/L Final  . Glucose, Bld 06/21/2016 92  70 - 99 mg/dL Final  . BUN 06/21/2016 13  6 - 23 mg/dL Final  . Creatinine, Ser 06/21/2016 1.01  0.40 - 1.50 mg/dL Final  . Total Bilirubin 06/21/2016 0.7  0.2 - 1.2 mg/dL Final  . Alkaline Phosphatase 06/21/2016 51  39 - 117 U/L Final  . AST 06/21/2016 17  0 - 37 U/L Final  . ALT 06/21/2016 18  0 - 53 U/L Final  . Total Protein 06/21/2016 7.0  6.0 - 8.3 g/dL Final  . Albumin 06/21/2016 4.4  3.5 - 5.2 g/dL Final  . Calcium 06/21/2016 9.7  8.4 - 10.5 mg/dL Final  . GFR 06/21/2016 95.44  >60.00 mL/min Final    Physical Examination:  BP (!) 150/86 (BP Location: Left Arm, Cuff Size: Normal)   Pulse 82   Ht 5' 5"  (1.651 m)   Wt 190 lb 3.2 oz (86.3 kg)   SpO2 97%   BMI 31.65 kg/m        ASSESSMENT:  Diabetes type 2 with BMI 33 See history of present illness for detailed discussion of current diabetes management, blood sugar patterns and problems identified  He has fairly consistent control with A1c 6.6, about the same as before Overall has done well with trying to be more active since his last visit Generally doing fairly good on his diet and has no significant postprandial hyperglycemia He appears to be more motivated now He does have a couple of readings in the 60s and blood sugars are low normal in the afternoons Weight is stable.  PLAN:    He will reduce to glipizide ER down to 2.5 mg  Call if having unusually high or low sugars  Consistent diet and monitoring after meals  Follow-up with PCP for hypertension  Patient Instructions  Check blood sugars on waking up  2-3/7  Also check blood sugars about 2 hours after a meal and do this after different meals by rotation  Recommended blood sugar levels on waking up is 90-130 and about 2 hours after meal is 130-160  Please bring your blood sugar monitor to each visit, thank you  Reduce Glipizide dose       Matilyn Fehrman 06/26/2016, 11:35 AM   Note: This office note was prepared with Dragon voice recognition system technology. Any transcriptional errors that result from this process are unintentional.

## 2016-07-16 ENCOUNTER — Other Ambulatory Visit: Payer: 59 | Admitting: Internal Medicine

## 2016-07-18 ENCOUNTER — Encounter: Payer: 59 | Admitting: Internal Medicine

## 2016-07-23 ENCOUNTER — Other Ambulatory Visit: Payer: 59 | Admitting: Internal Medicine

## 2016-07-23 DIAGNOSIS — Z125 Encounter for screening for malignant neoplasm of prostate: Secondary | ICD-10-CM

## 2016-07-23 DIAGNOSIS — E785 Hyperlipidemia, unspecified: Secondary | ICD-10-CM

## 2016-07-23 DIAGNOSIS — E669 Obesity, unspecified: Secondary | ICD-10-CM

## 2016-07-23 DIAGNOSIS — Z0000 Encounter for general adult medical examination without abnormal findings: Secondary | ICD-10-CM

## 2016-07-23 DIAGNOSIS — I10 Essential (primary) hypertension: Secondary | ICD-10-CM

## 2016-07-23 DIAGNOSIS — E119 Type 2 diabetes mellitus without complications: Secondary | ICD-10-CM

## 2016-07-23 DIAGNOSIS — E8881 Metabolic syndrome: Secondary | ICD-10-CM

## 2016-07-23 LAB — CBC WITH DIFFERENTIAL/PLATELET
BASOS PCT: 0 %
Basophils Absolute: 0 cells/uL (ref 0–200)
Eosinophils Absolute: 231 cells/uL (ref 15–500)
Eosinophils Relative: 3 %
HCT: 44.5 % (ref 38.5–50.0)
Hemoglobin: 14.1 g/dL (ref 13.2–17.1)
LYMPHS PCT: 20 %
Lymphs Abs: 1540 cells/uL (ref 850–3900)
MCH: 25.6 pg — ABNORMAL LOW (ref 27.0–33.0)
MCHC: 31.7 g/dL — ABNORMAL LOW (ref 32.0–36.0)
MCV: 80.9 fL (ref 80.0–100.0)
MONO ABS: 693 {cells}/uL (ref 200–950)
MPV: 10.2 fL (ref 7.5–12.5)
Monocytes Relative: 9 %
Neutro Abs: 5236 cells/uL (ref 1500–7800)
Neutrophils Relative %: 68 %
PLATELETS: 210 10*3/uL (ref 140–400)
RBC: 5.5 MIL/uL (ref 4.20–5.80)
RDW: 15 % (ref 11.0–15.0)
WBC: 7.7 10*3/uL (ref 3.8–10.8)

## 2016-07-24 LAB — COMPLETE METABOLIC PANEL WITH GFR
ALT: 19 U/L (ref 9–46)
AST: 16 U/L (ref 10–35)
Albumin: 4.1 g/dL (ref 3.6–5.1)
Alkaline Phosphatase: 64 U/L (ref 40–115)
BUN: 14 mg/dL (ref 7–25)
CALCIUM: 9.3 mg/dL (ref 8.6–10.3)
CHLORIDE: 102 mmol/L (ref 98–110)
CO2: 25 mmol/L (ref 20–31)
CREATININE: 1.21 mg/dL (ref 0.70–1.25)
GFR, Est African American: 73 mL/min (ref >=60)
GFR, Est Non African American: 63 mL/min (ref >=60)
GLUCOSE: 111 mg/dL — AB (ref 65–99)
Potassium: 4.3 mmol/L (ref 3.5–5.3)
SODIUM: 141 mmol/L (ref 135–146)
Total Bilirubin: 0.7 mg/dL (ref 0.2–1.2)
Total Protein: 6.7 g/dL (ref 6.1–8.1)

## 2016-07-24 LAB — LIPID PANEL
CHOL/HDL RATIO: 2.1 ratio (ref <5.0)
CHOLESTEROL: 70 mg/dL (ref <200)
HDL: 34 mg/dL — AB (ref >40)
LDL Cholesterol: 17 mg/dL (ref <100)
TRIGLYCERIDES: 95 mg/dL (ref <150)
VLDL: 19 mg/dL (ref <30)

## 2016-07-24 LAB — MICROALBUMIN / CREATININE URINE RATIO
Creatinine, Urine: 86 mg/dL (ref 20–370)
Microalb Creat Ratio: 22 mcg/mg creat (ref <30)
Microalb, Ur: 1.9 mg/dL

## 2016-07-24 LAB — HEMOGLOBIN A1C
Hgb A1c MFr Bld: 6.6 % — ABNORMAL HIGH (ref <5.7)
Mean Plasma Glucose: 143 mg/dL

## 2016-07-24 LAB — PSA: PSA: 1.1 ng/mL (ref <=4.0)

## 2016-07-25 ENCOUNTER — Ambulatory Visit (INDEPENDENT_AMBULATORY_CARE_PROVIDER_SITE_OTHER): Payer: 59 | Admitting: Internal Medicine

## 2016-07-25 ENCOUNTER — Encounter: Payer: Self-pay | Admitting: Internal Medicine

## 2016-07-25 VITALS — BP 136/80 | HR 77 | Temp 97.8°F | Ht 64.0 in | Wt 191.0 lb

## 2016-07-25 DIAGNOSIS — I10 Essential (primary) hypertension: Secondary | ICD-10-CM

## 2016-07-25 DIAGNOSIS — E119 Type 2 diabetes mellitus without complications: Secondary | ICD-10-CM

## 2016-07-25 DIAGNOSIS — E784 Other hyperlipidemia: Secondary | ICD-10-CM | POA: Diagnosis not present

## 2016-07-25 DIAGNOSIS — Z6832 Body mass index (BMI) 32.0-32.9, adult: Secondary | ICD-10-CM | POA: Diagnosis not present

## 2016-07-25 DIAGNOSIS — Z0000 Encounter for general adult medical examination without abnormal findings: Secondary | ICD-10-CM

## 2016-07-25 DIAGNOSIS — Z8673 Personal history of transient ischemic attack (TIA), and cerebral infarction without residual deficits: Secondary | ICD-10-CM

## 2016-07-25 DIAGNOSIS — E786 Lipoprotein deficiency: Secondary | ICD-10-CM | POA: Diagnosis not present

## 2016-07-25 DIAGNOSIS — E8881 Metabolic syndrome: Secondary | ICD-10-CM

## 2016-07-25 DIAGNOSIS — R202 Paresthesia of skin: Secondary | ICD-10-CM | POA: Diagnosis not present

## 2016-07-25 DIAGNOSIS — E7849 Other hyperlipidemia: Secondary | ICD-10-CM

## 2016-07-25 LAB — POCT URINALYSIS DIPSTICK
Bilirubin, UA: NEGATIVE
Glucose, UA: NEGATIVE
Ketones, UA: NEGATIVE
Leukocytes, UA: NEGATIVE
NITRITE UA: NEGATIVE
PH UA: 7.5 (ref 5.0–8.0)
PROTEIN UA: NEGATIVE
RBC UA: NEGATIVE
SPEC GRAV UA: 1.01 (ref 1.010–1.025)
UROBILINOGEN UA: 0.2 U/dL

## 2016-07-25 NOTE — Progress Notes (Signed)
   Subjective:    Patient ID: Mike Parker, male    DOB: 19-May-1950, 66 y.o.   MRN: [id number]  HPI 66 year old Male for health maintenance exam and evaluation of medical issues.  He is now retired after 40 years with the [employer].  He has a history of essential hypertension, controlled type 2 diabetes mellitus, hyperlipidemia, metabolic syndrome, obesity.  He had a left thalamic CVA June 2014. 2-D echocardiogram done at that time showed moderate LVH and diastolic dysfunction. Renal artery duplex scan was negative for renal artery stenosis. Still has some paresthesias in his right hand status post CVA but recovered well.  He has been evaluated for sleep apnea with snoring. Sleep study was not done. Dr. Shelle Ironlance thought he just needed to lose weight. Indeed he has lost weight. In June 2017, he weighed 208 pounds. Now weighs 191 pounds.  He takes Ativan for insomnia. The insomnia started after his stroke which frightened him a bit. He said his mother died of a stroke and that was disconcerting to him.  History of frozen shoulder treated previously by Dr. Teressa SenterSypher.  No known drug allergies  Social history: He is married. Wife continues to work it [employer]. 2 adult daughters and 2 grandchildren. Does not smoke or consume alcohol.    Review of Systems  Constitutional: Negative.   Respiratory: Negative.   Cardiovascular: Negative.   Gastrointestinal: Negative.   Genitourinary: Negative.   Psychiatric/Behavioral: Negative.        Objective:   Physical Exam  Constitutional: He is oriented to person, place, and time. He appears well-developed and well-nourished. No distress.  HENT:  Head: Normocephalic and atraumatic.  Right Ear: External ear normal.  Left Ear: External ear normal.  Mouth/Throat: Oropharynx is clear and moist. No oropharyngeal exudate.  Eyes: Conjunctivae and EOM are normal. Pupils are equal, round, and reactive to light. Right eye exhibits no discharge. Left eye  exhibits no discharge. No scleral icterus.  Neck: Neck supple. No JVD present. No thyromegaly present.  Cardiovascular: Normal rate, regular rhythm and normal heart sounds.   No murmur heard. Pulmonary/Chest: Effort normal and breath sounds normal. No respiratory distress. He has no wheezes.  Abdominal: Soft. Bowel sounds are normal. He exhibits no distension and no mass. There is no tenderness. There is no rebound and no guarding.  Genitourinary: Prostate normal.  Musculoskeletal: He exhibits no edema.  Lymphadenopathy:    He has no cervical adenopathy.  Neurological: He is alert and oriented to person, place, and time. He has normal reflexes. No cranial nerve deficit. Coordination normal.  Skin: Skin is warm and dry. No rash noted. He is not diaphoretic.  Psychiatric: He has a normal mood and affect. His behavior is normal. Judgment and thought content normal.  Vitals reviewed.         Assessment & Plan:  Normal health maintenance exam  History of left thalamic CVA with resultant paresthesia in hand  Controlled type 2 diabetes mellitus-endocrinologist helped him get perspective on controlling his diabetes and he is doing much better. He has lost weight.Hemoglobin A1c 6.6%  Essential hypertension  Metabolic syndrome  Hyperlipidemia-stable on statin therapy. Has low HDL.  Plan: I'm pleased with his weight loss and his diabetic control. Return in 6 months. Had colonoscopy done in 2017 which was completely normal.

## 2016-07-27 NOTE — Patient Instructions (Addendum)
It was a pleasure to see you today. Keep up the good work with weight loss. Return in 6 months. Continue diet and exercise efforts. Continue same medications.

## 2016-08-29 ENCOUNTER — Other Ambulatory Visit: Payer: Self-pay | Admitting: Endocrinology

## 2016-09-11 ENCOUNTER — Other Ambulatory Visit: Payer: Self-pay | Admitting: Internal Medicine

## 2016-09-11 NOTE — Telephone Encounter (Signed)
Refill each x one year 

## 2016-10-12 ENCOUNTER — Other Ambulatory Visit: Payer: Self-pay | Admitting: Internal Medicine

## 2016-10-24 ENCOUNTER — Other Ambulatory Visit (INDEPENDENT_AMBULATORY_CARE_PROVIDER_SITE_OTHER): Payer: Medicare Other

## 2016-10-24 DIAGNOSIS — E1165 Type 2 diabetes mellitus with hyperglycemia: Secondary | ICD-10-CM

## 2016-10-24 LAB — BASIC METABOLIC PANEL
BUN: 14 mg/dL (ref 6–23)
CHLORIDE: 103 meq/L (ref 96–112)
CO2: 29 mEq/L (ref 19–32)
CREATININE: 1.01 mg/dL (ref 0.40–1.50)
Calcium: 10.1 mg/dL (ref 8.4–10.5)
GFR: 95.34 mL/min (ref >60.00)
Glucose, Bld: 123 mg/dL — ABNORMAL HIGH (ref 70–99)
POTASSIUM: 3.7 meq/L (ref 3.5–5.1)
Sodium: 141 mEq/L (ref 135–145)

## 2016-10-24 LAB — HEMOGLOBIN A1C: HEMOGLOBIN A1C: 6.7 % — AB (ref 4.6–6.5)

## 2016-10-27 NOTE — Progress Notes (Signed)
Patient ID: Mike Parker, male   DOB: [birth year], 66 y.o.   MRN: [id number]           Reason for Appointment: Follow-up for Type 2 Diabetes  Referring physician: [place]   History of Present Illness:          Date of diagnosis of type 2 diabetes mellitus: ?  2011        Background history:   He appears to have been on glipizide and Janumet for several years with fair control His A1c had been over 7% since 2015 He was referred here because of an A1c of 9.2% done in 6/17  Recent history:   His A1c is stable at 6.7, previously range 6.4-6.6  Non-insulin hypoglycemic drugs the patient is taking are: Janumet 50/500 xr twice a day, glipizide ER 2.5 mg daily  Current management, blood sugar patterns and problems identified:   His glipizide ER was reduced down to 2.5 mg in 5/18 because of a couple of readings in the 60s on his last visit  However he still had one episode of hypoglycemia late evening with glucose 58 even though his blood sugars are not otherwise below 80 usually  He says that his mealtimes and day-to-day routine are very erratic over the last couple of months and he may not eat on time  Although he still trying to be active with walking is not doing this as regularly  Also he does not monitor his blood sugars AFTER evening meal as directed  He is frequently just eating some amount of cereal in the morning and then eating a main meal around midday  Only rarely his blood sugars are over 180 after meals during the day  Fasting blood sugars are on an average close to 100       Side effects from medications have been: None  Compliance with the medical regimen: Improved  Glucose monitoring:  done 1 times a day         Glucometer: One Touch Blood Glucose readings by   Mean values apply above for all meters except median for One Touch  PRE-MEAL Fasting Lunch Dinner Bedtime Overall  Glucose range: 96-1 23   58 78     Mean/median: 102     104   POST-MEAL PC  Breakfast PC Lunch PC Dinner  Glucose range:     Mean/median:  103-209     Self-care:  Meal times are:  Breakfast is at 12 noon, dinner usually 7-8 PM  Typical meal intake: Breakfast is near noon eggs, sausage, applesauce.  Dinner is chicken or pork, greens, corn needed snacks usually nuts, fruits or chips               Dietician visit, most recent: 8/17               Exercise:  walking 45 minutes 1-3 times a week,   Weight history:  Wt Readings from Last 3 Encounters:  10/28/16 191 lb (86.6 kg)  07/25/16 191 lb (86.6 kg)  06/26/16 190 lb 3.2 oz (86.3 kg)    Glycemic control:   Lab Results  Component Value Date   HGBA1C 6.7 (H) 10/24/2016   HGBA1C 6.6 (H) 07/23/2016   HGBA1C 6.6 (H) 06/21/2016   Lab Results  Component Value Date   MICROALBUR 1.9 07/23/2016   LDLCALC 17 07/23/2016   CREATININE 1.01 10/24/2016   Lab Results  Component Value Date   MICRALBCREAT 22 07/23/2016    Other  problems discussed: See review of systems    Allergies as of 10/28/2016   No Known Allergies     Medication List       Accurate as of 10/28/16  2:01 PM. Always use your most recent med list.          amLODipine 10 MG tablet Commonly known as:  NORVASC TAKE 1 TABLET DAILY   aspirin 81 MG EC tablet Take 1 tablet (81 mg total) by mouth daily.   atorvastatin 10 MG tablet Commonly known as:  LIPITOR TAKE 1 TABLET AT SUPPER FOR LIPID LOWERING   blood glucose meter kit and supplies Dispense based on patient and insurance preference. Test twice daily; dx code: Q22.9   BYSTOLIC 10 MG tablet Generic drug:  nebivolol TAKE 1 TABLET DAILY   cloNIDine 0.1 MG tablet Commonly known as:  CATAPRES Take 1 tablet (0.1 mg total) by mouth 2 (two) times daily.   econazole nitrate 1 % cream APPLY TOPICALLY DAILY   fluticasone 50 MCG/ACT nasal spray Commonly known as:  FLONASE Place 2 sprays into both nostrils daily as needed. Reported on 08/11/2015   freestyle lancets   furosemide  40 MG tablet Commonly known as:  LASIX TAKE 1 TABLET DAILY   glipiZIDE 2.5 MG 24 hr tablet Commonly known as:  GLUCOTROL XL Take 1 tablet (2.5 mg total) by mouth daily with breakfast.   JANUMET XR 50-500 MG Tb24 Generic drug:  SitaGLIPtin-MetFORMIN HCl TAKE 2 TABLETS DAILY   LORazepam 1 MG tablet Commonly known as:  ATIVAN Take 1 tablet (1 mg total) by mouth at bedtime as needed.   losartan 100 MG tablet Commonly known as:  COZAAR TAKE 1 TABLET DAILY   ONETOUCH VERIO test strip Generic drug:  glucose blood USE TO CHECK BLOOD SUGAR TWICE A DAY       Allergies: No Known Allergies  Past Medical History:  Diagnosis Date  . Allergy   . Diabetes mellitus   . Hypertension   . Insomnia     Past Surgical History:  Procedure Laterality Date  . COLONOSCOPY    . ELBOW SURGERY     rt elbow  . HERNIA REPAIR     left inguinal  . KNEE SURGERY     rt and left    Family History  Problem Relation Age of Onset  . Stroke Mother   . Hypertension Brother   . Colon cancer Neg Hx   . Diabetes Neg Hx     Social History:  reports that he has never smoked. He has never used smokeless tobacco. He reports that he does not drink alcohol or use drugs.   Review of Systems   Lipid history: On treatment for about a year with Lipitor 10 mg daily from PCP    Lab Results  Component Value Date   CHOL 70 07/23/2016   HDL 34 (L) 07/23/2016   LDLCALC 17 07/23/2016   TRIG 95 07/23/2016   CHOLHDL 2.1 07/23/2016           Hypertension:Treated for >15 years   Home BP 130/70s, Using arm monitor  Blood pressure was higher on first measurement  BP Readings from Last 3 Encounters:  10/28/16 124/80  07/25/16 136/80  06/26/16 (!) 150/86     Most recent eye exam was 6/17  Most recent foot exam:7/17    LABS:  Lab on 10/24/2016  Component Date Value Ref Range Status  . Hgb A1c MFr Bld 10/24/2016 6.7* 4.6 - 6.5 %  Final   Glycemic Control Guidelines for People with  Diabetes:Non Diabetic:  <6%Goal of Therapy: <7%Additional Action Suggested:  >8%   . Sodium 10/24/2016 141  135 - 145 mEq/L Final  . Potassium 10/24/2016 3.7  3.5 - 5.1 mEq/L Final  . Chloride 10/24/2016 103  96 - 112 mEq/L Final  . CO2 10/24/2016 29  19 - 32 mEq/L Final  . Glucose, Bld 10/24/2016 123* 70 - 99 mg/dL Final  . BUN 10/24/2016 14  6 - 23 mg/dL Final  . Creatinine, Ser 10/24/2016 1.01  0.40 - 1.50 mg/dL Final  . Calcium 10/24/2016 10.1  8.4 - 10.5 mg/dL Final  . GFR 10/24/2016 95.34  >60.00 mL/min Final    Physical Examination:  BP 124/80   Pulse 73   Ht 5' 4"  (1.626 m)   Wt 191 lb (86.6 kg)   SpO2 98%   BMI 32.79 kg/m        ASSESSMENT:  Diabetes type 2 with BMI 33 See history of present illness for detailed discussion of current diabetes management, blood sugar patterns and problems identified  He has fairly consistent control with A1c 6.7 His glipizide was reduced and he continues to take Janumet XR. He does have slightly high readings after his breakfast or lunch at times based on his carbohydrate intake  Discussed day-to-day management of his diabetes including monitoring, diet, exercise and medications  PLAN:    He will Continue his regimen unchanged for now  May consider stopping glipizide if he tends to get low blood sugars again frequently  Encouraged him to eat consistently on time with some protein including at breakfast  To check more readings after supper which he is not doing  Consistent walking for exercise  For hypertension recommend that he consider using Catapres patch instead of clonidine tablets  Patient Instructions  Check blood sugars on waking up 2-3/7 days   Also check blood sugars about 2 hours after a meal and do this after different meals by rotation  Recommended blood sugar levels on waking up is 90-130 and about 2 hours after meal is 130-160  Please bring your blood sugar monitor to each visit, thank you       [facility] 10/28/2016, 2:01 PM   Note: This office note was prepared with Dragon voice recognition system technology. Any transcriptional errors that result from this process are unintentional.

## 2016-10-28 ENCOUNTER — Encounter: Payer: Self-pay | Admitting: Endocrinology

## 2016-10-28 ENCOUNTER — Ambulatory Visit (INDEPENDENT_AMBULATORY_CARE_PROVIDER_SITE_OTHER): Payer: Medicare Other | Admitting: Endocrinology

## 2016-10-28 VITALS — BP 124/80 | HR 73 | Ht 64.0 in | Wt 191.0 lb

## 2016-10-28 DIAGNOSIS — Z23 Encounter for immunization: Secondary | ICD-10-CM | POA: Diagnosis not present

## 2016-10-28 DIAGNOSIS — E1165 Type 2 diabetes mellitus with hyperglycemia: Secondary | ICD-10-CM | POA: Diagnosis not present

## 2016-10-28 NOTE — Patient Instructions (Signed)
Check blood sugars on waking up 2-3/7 days   Also check blood sugars about 2 hours after a meal and do this after different meals by rotation  Recommended blood sugar levels on waking up is 90-130 and about 2 hours after meal is 130-160  Please bring your blood sugar monitor to each visit, thank you   

## 2016-11-30 ENCOUNTER — Other Ambulatory Visit: Payer: Self-pay | Admitting: Internal Medicine

## 2016-11-30 ENCOUNTER — Other Ambulatory Visit: Payer: Self-pay | Admitting: Endocrinology

## 2016-12-10 ENCOUNTER — Other Ambulatory Visit: Payer: Self-pay | Admitting: Internal Medicine

## 2016-12-10 NOTE — Telephone Encounter (Signed)
Refill x 6 months 

## 2017-01-01 ENCOUNTER — Other Ambulatory Visit: Payer: Self-pay | Admitting: Internal Medicine

## 2017-01-01 DIAGNOSIS — I10 Essential (primary) hypertension: Secondary | ICD-10-CM

## 2017-01-01 DIAGNOSIS — Z0000 Encounter for general adult medical examination without abnormal findings: Secondary | ICD-10-CM

## 2017-01-01 DIAGNOSIS — Z13220 Encounter for screening for lipoid disorders: Secondary | ICD-10-CM

## 2017-01-01 DIAGNOSIS — Z125 Encounter for screening for malignant neoplasm of prostate: Secondary | ICD-10-CM

## 2017-01-14 ENCOUNTER — Other Ambulatory Visit (INDEPENDENT_AMBULATORY_CARE_PROVIDER_SITE_OTHER): Payer: Managed Care, Other (non HMO) | Admitting: Internal Medicine

## 2017-01-14 DIAGNOSIS — E1165 Type 2 diabetes mellitus with hyperglycemia: Secondary | ICD-10-CM | POA: Diagnosis not present

## 2017-01-14 DIAGNOSIS — Z13220 Encounter for screening for lipoid disorders: Secondary | ICD-10-CM | POA: Diagnosis not present

## 2017-01-14 DIAGNOSIS — Z0000 Encounter for general adult medical examination without abnormal findings: Secondary | ICD-10-CM

## 2017-01-14 DIAGNOSIS — I10 Essential (primary) hypertension: Secondary | ICD-10-CM

## 2017-01-14 DIAGNOSIS — Z79899 Other long term (current) drug therapy: Secondary | ICD-10-CM

## 2017-01-15 LAB — HEPATIC FUNCTION PANEL
AG Ratio: 1.8 (calc) (ref 1.0–2.5)
ALBUMIN MSPROF: 4.2 g/dL (ref 3.6–5.1)
ALT: 21 U/L (ref 9–46)
AST: 19 U/L (ref 10–35)
Alkaline phosphatase (APISO): 61 U/L (ref 40–115)
Bilirubin, Direct: 0.3 mg/dL — ABNORMAL HIGH (ref 0.0–0.2)
Globulin: 2.4 g/dL (calc) (ref 1.9–3.7)
Indirect Bilirubin: 0.7 mg/dL (calc) (ref 0.2–1.2)
Total Bilirubin: 1 mg/dL (ref 0.2–1.2)
Total Protein: 6.6 g/dL (ref 6.1–8.1)

## 2017-01-15 LAB — MICROALBUMIN / CREATININE URINE RATIO
CREATININE, URINE: 72 mg/dL (ref 20–320)
MICROALB UR: 1.1 mg/dL
MICROALB/CREAT RATIO: 15 ug/mg{creat} (ref <30)

## 2017-01-15 LAB — LIPID PANEL
CHOLESTEROL: 78 mg/dL (ref <200)
HDL: 38 mg/dL — AB (ref >40)
LDL CHOLESTEROL (CALC): 25 mg/dL
Non-HDL Cholesterol (Calc): 40 mg/dL (calc) (ref <130)
TRIGLYCERIDES: 73 mg/dL (ref <150)
Total CHOL/HDL Ratio: 2.1 (calc) (ref <5.0)

## 2017-01-15 LAB — HEMOGLOBIN A1C
EAG (MMOL/L): 8.2 (calc)
Hgb A1c MFr Bld: 6.8 % of total Hgb — ABNORMAL HIGH (ref <5.7)
Mean Plasma Glucose: 148 (calc)

## 2017-01-16 ENCOUNTER — Other Ambulatory Visit: Payer: Self-pay | Admitting: Internal Medicine

## 2017-01-16 ENCOUNTER — Encounter: Payer: Self-pay | Admitting: Internal Medicine

## 2017-01-16 ENCOUNTER — Ambulatory Visit (INDEPENDENT_AMBULATORY_CARE_PROVIDER_SITE_OTHER): Payer: 59 | Admitting: Internal Medicine

## 2017-01-16 VITALS — BP 130/78 | HR 72 | Ht 64.0 in | Wt 192.0 lb

## 2017-01-16 DIAGNOSIS — E7849 Other hyperlipidemia: Secondary | ICD-10-CM | POA: Diagnosis not present

## 2017-01-16 DIAGNOSIS — Z8673 Personal history of transient ischemic attack (TIA), and cerebral infarction without residual deficits: Secondary | ICD-10-CM

## 2017-01-16 DIAGNOSIS — E119 Type 2 diabetes mellitus without complications: Secondary | ICD-10-CM

## 2017-01-16 DIAGNOSIS — I10 Essential (primary) hypertension: Secondary | ICD-10-CM

## 2017-01-16 DIAGNOSIS — Z6832 Body mass index (BMI) 32.0-32.9, adult: Secondary | ICD-10-CM

## 2017-01-16 DIAGNOSIS — E8881 Metabolic syndrome: Secondary | ICD-10-CM

## 2017-01-16 NOTE — Progress Notes (Signed)
   Subjective:    Patient ID: Mike Parker, male    DOB: 1950/08/22, 66 y.o.   MRN: [id number]  HPI 66 year old  Male retired from [employer] in today for 2924-month recheck.  History of essential hypertension, controlled type 2 diabetes mellitus, hyperlipidemia, metabolic syndrome, obesity.  He had a left thalamic CVA June 2014.  2D echocardiogram at that time showed moderate LVH and diastolic dysfunction.  Renal artery duplex scan was negative for renal artery stenosis.  Still has some paresthesias in his right hand status post CVA but recovered well.  Evaluated for sleep apnea with snoring several years ago.  Sleep study was not done.  Dr. Shelle Ironlance  thought he needed to just lose weight.  In June 2018 he weighed 191 pounds.  In June 2017 he weighed 208 pounds.  Now weighs 192 pounds.  Takes Ativan for insomnia.  The insomnia started after his stroke which frightened him.  Spending time with grandchildren taking them the various activities.  Labs reviewed including hemoglobin A1c are excellent.  Hemoglobin A1c is 6.8% and previously was 6.7% 3 months ago.  Lipid panel is normal with exception of a low HDL cholesterol.  Liver functions are normal.  Blood pressure is stable on current regimen.    Review of Systems no new complaints     Objective:   Physical Exam  Constitutional: He appears well-developed and well-nourished. No distress.  Neck: No JVD present. No thyromegaly present.  Cardiovascular: Normal rate, normal heart sounds and intact distal pulses.  No murmur heard. Pulmonary/Chest: Effort normal and breath sounds normal. No respiratory distress. He has no wheezes. He has no rales.  Musculoskeletal: He exhibits no edema.  Lymphadenopathy:    He has no cervical adenopathy.  Skin: Skin is warm and dry. He is not diaphoretic.  Vitals reviewed.         Assessment & Plan:  Controlled type 2 diabetes mellitus stable  Essential hypertension-stable  Hyperlipidemia-lipid  panel and liver function stable on statin therapy  Metabolic syndrome-continue to encourage diet exercise and weight loss  Plan: Continue current regimen and return in 6 months for physical examination.

## 2017-02-19 ENCOUNTER — Encounter: Payer: Self-pay | Admitting: Internal Medicine

## 2017-02-19 NOTE — Patient Instructions (Signed)
It was a pleasure to see you today.  Continue same medications and return in 6 months for physical examination.  Keep up the good work.

## 2017-02-28 ENCOUNTER — Other Ambulatory Visit (INDEPENDENT_AMBULATORY_CARE_PROVIDER_SITE_OTHER): Payer: Managed Care, Other (non HMO)

## 2017-02-28 DIAGNOSIS — E1165 Type 2 diabetes mellitus with hyperglycemia: Secondary | ICD-10-CM | POA: Diagnosis not present

## 2017-02-28 LAB — COMPREHENSIVE METABOLIC PANEL
ALT: 20 U/L (ref 0–53)
AST: 16 U/L (ref 0–37)
Albumin: 4.3 g/dL (ref 3.5–5.2)
Alkaline Phosphatase: 60 U/L (ref 39–117)
BUN: 16 mg/dL (ref 6–23)
CO2: 28 mEq/L (ref 19–32)
CREATININE: 1.07 mg/dL (ref 0.40–1.50)
Calcium: 9.4 mg/dL (ref 8.4–10.5)
Chloride: 104 mEq/L (ref 96–112)
GFR: 89.1 mL/min (ref >60.00)
Glucose, Bld: 143 mg/dL — ABNORMAL HIGH (ref 70–99)
Potassium: 3.7 mEq/L (ref 3.5–5.1)
SODIUM: 140 meq/L (ref 135–145)
Total Bilirubin: 0.8 mg/dL (ref 0.2–1.2)
Total Protein: 7.4 g/dL (ref 6.0–8.3)

## 2017-02-28 LAB — HEMOGLOBIN A1C: Hgb A1c MFr Bld: 6.8 % — ABNORMAL HIGH (ref 4.6–6.5)

## 2017-03-03 ENCOUNTER — Ambulatory Visit (INDEPENDENT_AMBULATORY_CARE_PROVIDER_SITE_OTHER): Payer: Managed Care, Other (non HMO) | Admitting: Endocrinology

## 2017-03-03 ENCOUNTER — Encounter: Payer: Self-pay | Admitting: Endocrinology

## 2017-03-03 VITALS — BP 139/79 | HR 70 | Ht 64.0 in | Wt 192.8 lb

## 2017-03-03 DIAGNOSIS — E1165 Type 2 diabetes mellitus with hyperglycemia: Secondary | ICD-10-CM

## 2017-03-03 MED ORDER — METFORMIN HCL ER 500 MG PO TB24: 500.0000 mg | ORAL_TABLET | Freq: Every day | ORAL | 3 refills | Status: DC

## 2017-03-03 NOTE — Patient Instructions (Signed)
Stop Glipizide and add Metformin ER at dinner

## 2017-03-03 NOTE — Progress Notes (Signed)
Patient ID: Mike Parker, male   DOB: 06/05/1950, 67 y.o.   MRN: [id number]           Reason for Appointment: Follow-up for Type 2 Diabetes  Referring physician: [place]   History of Present Illness:          Date of diagnosis of type 2 diabetes mellitus: ?  2011        Background history:   He appears to have been on glipizide and Janumet for several years with fair control His A1c had been over 7% since 2015 He was referred here because of an A1c of 9.2% done in 6/17  Recent history:   His A1c is stable at 6.8, previously range 6.4-6.8  Non-insulin hypoglycemic drugs the patient is taking are: Janumet 50/500 xr twice a day, glipizide ER 2.5 mg daily  Current management, blood sugar patterns and problems identified:   He appears to be having relatively higher fasting readings highest 163  This is despite his blood sugars in the evenings being no more than about 140  However still checking readings more in the morning than any other time  Does not feel hypoglycemic, does not remember if he had symptoms with a glucose of 68 at bedtime  For various reasons including knee pain he has not done his usual walking  However has maintained his weight  He had previously tried extra metformin with his Janumet but he does not think he had the extended release version at home       Side effects from medications have been: None  Compliance with the medical regimen: Improved  Glucose monitoring:  done 1 times a day         Glucometer: One Touch Blood Glucose readings by his download   Prebreakfast readings: Range 105-163 with median about 128  Nonfasting readings range from 68-143 OVERALL median 120  Self-care:  Meal times are:  Breakfast is at 12 noon, dinner usually 7-8 PM  Typical meal intake: Breakfast is near noon,  eggs, sausage, applesauce.   Dinner is chicken or pork, greens, corn; snacks usually nuts, fruits            Dietician visit, most recent: 8/17      Exercise: not walking until about 2 weeks ago  Weight history:  Wt Readings from Last 3 Encounters:  03/03/17 192 lb 12.8 oz (87.5 kg)  01/16/17 192 lb (87.1 kg)  10/28/16 191 lb (86.6 kg)    Glycemic control:   Lab Results  Component Value Date   HGBA1C 6.8 (H) 02/28/2017   HGBA1C 6.8 (H) 01/14/2017   HGBA1C 6.7 (H) 10/24/2016   Lab Results  Component Value Date   MICROALBUR 1.1 01/14/2017   LDLCALC 17 07/23/2016   CREATININE 1.07 02/28/2017   Lab Results  Component Value Date   MICRALBCREAT 15 01/14/2017    Other problems discussed: See review of systems    Allergies as of 03/03/2017   No Known Allergies     Medication List        Accurate as of 03/03/17  9:13 AM. Always use your most recent med list.          amLODipine 10 MG tablet Commonly known as:  NORVASC TAKE 1 TABLET DAILY   aspirin 81 MG EC tablet Take 1 tablet (81 mg total) by mouth daily.   atorvastatin 10 MG tablet Commonly known as:  LIPITOR TAKE 1 TABLET AT SUPPER FOR LIPID LOWERING   blood glucose  meter kit and supplies Dispense based on patient and insurance preference. Test twice daily; dx code: Q33.0   BYSTOLIC 10 MG tablet Generic drug:  nebivolol TAKE 1 TABLET DAILY   cloNIDine 0.1 MG tablet Commonly known as:  CATAPRES Take 1 tablet (0.1 mg total) by mouth 2 (two) times daily.   econazole nitrate 1 % cream APPLY TOPICALLY DAILY   fluticasone 50 MCG/ACT nasal spray Commonly known as:  FLONASE Place 2 sprays into both nostrils daily as needed. Reported on 08/11/2015   freestyle lancets   furosemide 40 MG tablet Commonly known as:  LASIX TAKE 1 TABLET DAILY   glipiZIDE 2.5 MG 24 hr tablet Commonly known as:  GLUCOTROL XL Take 1 tablet (2.5 mg total) by mouth daily with breakfast.   JANUMET XR 50-500 MG Tb24 Generic drug:  SitaGLIPtin-MetFORMIN HCl TAKE 2 TABLETS DAILY   LORazepam 1 MG tablet Commonly known as:  ATIVAN Take 1 tablet (1 mg total) by mouth at  bedtime as needed.   losartan 100 MG tablet Commonly known as:  COZAAR TAKE 1 TABLET DAILY   ONETOUCH VERIO test strip Generic drug:  glucose blood USE TO CHECK BLOOD SUGAR TWICE A DAY       Allergies: No Known Allergies  Past Medical History:  Diagnosis Date  . Allergy   . Diabetes mellitus   . Hypertension   . Insomnia     Past Surgical History:  Procedure Laterality Date  . COLONOSCOPY    . ELBOW SURGERY     rt elbow  . HERNIA REPAIR     left inguinal  . KNEE SURGERY     rt and left    Family History  Problem Relation Age of Onset  . Stroke Mother   . Hypertension Brother   . Colon cancer Neg Hx   . Diabetes Neg Hx     Social History:  reports that  has never smoked. he has never used smokeless tobacco. He reports that he does not drink alcohol or use drugs.   Review of Systems   Lipid history: On treatment for about a year with Lipitor 10 mg daily from PCP    Lab Results  Component Value Date   CHOL 78 01/14/2017   HDL 38 (L) 01/14/2017   LDLCALC 17 07/23/2016   TRIG 73 01/14/2017   CHOLHDL 2.1 01/14/2017           Hypertension:Treated for >15 years and followed by PCP  Home BP 130/70s, Using arm monitor    BP Readings from Last 3 Encounters:  03/03/17 139/79  01/16/17 130/78  10/28/16 124/80     Most recent eye exam was 6/17  Most recent foot exam: 12/2016 by PCP    LABS:  Lab on 02/28/2017  Component Date Value Ref Range Status  . Sodium 02/28/2017 140  135 - 145 mEq/L Final  . Potassium 02/28/2017 3.7  3.5 - 5.1 mEq/L Final  . Chloride 02/28/2017 104  96 - 112 mEq/L Final  . CO2 02/28/2017 28  19 - 32 mEq/L Final  . Glucose, Bld 02/28/2017 143* 70 - 99 mg/dL Final  . BUN 02/28/2017 16  6 - 23 mg/dL Final  . Creatinine, Ser 02/28/2017 1.07  0.40 - 1.50 mg/dL Final  . Total Bilirubin 02/28/2017 0.8  0.2 - 1.2 mg/dL Final  . Alkaline Phosphatase 02/28/2017 60  39 - 117 U/L Final  . AST 02/28/2017 16  0 - 37 U/L Final  .  ALT 02/28/2017  20  0 - 53 U/L Final  . Total Protein 02/28/2017 7.4  6.0 - 8.3 g/dL Final  . Albumin 02/28/2017 4.3  3.5 - 5.2 g/dL Final  . Calcium 02/28/2017 9.4  8.4 - 10.5 mg/dL Final  . GFR 02/28/2017 89.10  >60.00 mL/min Final  . Hgb A1c MFr Bld 02/28/2017 6.8* 4.6 - 6.5 % Final   Glycemic Control Guidelines for People with Diabetes:Non Diabetic:  <6%Goal of Therapy: <7%Additional Action Suggested:  >8%     Physical Examination:  BP 139/79 (BP Location: Left Arm, Patient Position: Sitting, Cuff Size: Large)   Pulse 70   Ht _0  (1.626 m)   Wt 192 lb 12.8 oz (87.5 kg)   BMI 33.09 kg/m        ASSESSMENT:  Diabetes type 2 on oral agents  See history of present illness for detailed discussion of current diabetes management, blood sugar patterns and problems identified  He has fairly consistent control with A1c 6.8  However still having low normal blood sugars in the evenings with using glipizide ER 2.5 mg Although he has not had any symptomatic hypoglycemia he tends to have relatively higher fasting readings May do better with increasing metformin but not clear if he will tolerate the larger doses of Janumet XR with 2000 mg metformin  HYPERTENSION: Well controlled, followed by PCP and cardiologist also, regimen includes losartan 100 mg  PLAN:    He will take additional metformin ER 500 mg daily at dinnertime and stop glipizide, prescription sent  May also do better overall with resuming exercise recently which she has not done until 2 weeks ago  More blood sugars after meals to be checked  Reassess in 4 months    There are no Patient Instructions on file for this visit.    Elayne Snare 03/03/2017, 9:13 AM   Note: This office note was prepared with Dragon voice recognition system technology. Any transcriptional errors that result from this process are unintentional.

## 2017-03-12 ENCOUNTER — Other Ambulatory Visit: Payer: Self-pay

## 2017-03-12 MED ORDER — SITAGLIP PHOS-METFORMIN HCL ER 50-500 MG PO TB24: 2.0000 | ORAL_TABLET | Freq: Every day | ORAL | 1 refills | Status: DC

## 2017-03-24 ENCOUNTER — Other Ambulatory Visit: Payer: Self-pay

## 2017-03-24 MED ORDER — AMLODIPINE BESYLATE 10 MG PO TABS: 10.0000 mg | ORAL_TABLET | Freq: Every day | ORAL | 3 refills | Status: DC

## 2017-03-24 MED ORDER — LOSARTAN POTASSIUM 100 MG PO TABS: 100.0000 mg | ORAL_TABLET | Freq: Every day | ORAL | 3 refills | Status: DC

## 2017-03-26 ENCOUNTER — Other Ambulatory Visit: Payer: Self-pay

## 2017-03-26 MED ORDER — FUROSEMIDE 40 MG PO TABS: 40.0000 mg | ORAL_TABLET | Freq: Every day | ORAL | 3 refills | Status: DC

## 2017-03-26 MED ORDER — ATORVASTATIN CALCIUM 10 MG PO TABS: ORAL_TABLET | ORAL | 3 refills | Status: DC

## 2017-04-02 ENCOUNTER — Other Ambulatory Visit: Payer: Self-pay

## 2017-04-02 MED ORDER — CLONIDINE HCL 0.1 MG PO TABS: 0.1000 mg | ORAL_TABLET | Freq: Two times a day (BID) | ORAL | 0 refills | Status: DC

## 2017-05-16 ENCOUNTER — Other Ambulatory Visit: Payer: Self-pay | Admitting: Endocrinology

## 2017-05-22 ENCOUNTER — Encounter: Payer: Self-pay | Admitting: Cardiology

## 2017-05-29 ENCOUNTER — Other Ambulatory Visit: Payer: Self-pay | Admitting: Internal Medicine

## 2017-06-03 ENCOUNTER — Other Ambulatory Visit (INDEPENDENT_AMBULATORY_CARE_PROVIDER_SITE_OTHER): Payer: Managed Care, Other (non HMO)

## 2017-06-03 DIAGNOSIS — E1165 Type 2 diabetes mellitus with hyperglycemia: Secondary | ICD-10-CM | POA: Diagnosis not present

## 2017-06-03 LAB — HEMOGLOBIN A1C: Hgb A1c MFr Bld: 7 % — ABNORMAL HIGH (ref 4.6–6.5)

## 2017-06-03 LAB — BASIC METABOLIC PANEL
BUN: 12 mg/dL (ref 6–23)
CALCIUM: 9.5 mg/dL (ref 8.4–10.5)
CO2: 28 meq/L (ref 19–32)
CREATININE: 0.98 mg/dL (ref 0.40–1.50)
Chloride: 101 mEq/L (ref 96–112)
GFR: 98.53 mL/min (ref >60.00)
Glucose, Bld: 122 mg/dL — ABNORMAL HIGH (ref 70–99)
Potassium: 3.9 mEq/L (ref 3.5–5.1)
SODIUM: 138 meq/L (ref 135–145)

## 2017-06-06 ENCOUNTER — Ambulatory Visit (INDEPENDENT_AMBULATORY_CARE_PROVIDER_SITE_OTHER): Payer: Managed Care, Other (non HMO) | Admitting: Endocrinology

## 2017-06-06 ENCOUNTER — Encounter: Payer: Self-pay | Admitting: Endocrinology

## 2017-06-06 VITALS — BP 140/88 | HR 69 | Ht 64.0 in | Wt 191.4 lb

## 2017-06-06 DIAGNOSIS — E1165 Type 2 diabetes mellitus with hyperglycemia: Secondary | ICD-10-CM

## 2017-06-06 LAB — GLUCOSE, POCT (MANUAL RESULT ENTRY): POC GLUCOSE: 126 mg/dL — AB (ref 70–99)

## 2017-06-06 NOTE — Progress Notes (Signed)
Patient ID: Mike Parker, male   DOB: [birth year], 67 y.o.   MRN: [id number]           Reason for Appointment: Follow-up for Type 2 Diabetes  Referring physician: [place]   History of Present Illness:          Date of diagnosis of type 2 diabetes mellitus: ?  2011        Background history:   He appears to have been on glipizide and Janumet for several years with fair control His A1c had been over 7% since 2015 He was referred here because of an A1c of 9.2% done in 6/17  Recent history:   His A1c is slightly higher at 7% compared 6.8 before and has been as low as 6.4   Non-insulin hypoglycemic drugs the patient is taking are: Janumet 50/500 xr twice a day, glipizide ER 2.5 mg daily, metformin ER 500 mg PCS  Current management, blood sugar patterns and problems identified:   He was told to try external metformin ER because of his relatively higher fasting readings on his last visit  He says that he is not able to take this consistently unless he is eating a full meal it may tend to cause diarrhea  However when he does take it his blood sugars in the mornings are better  Did not bring his monitor for download  He may have higher readings after meals but he does not think they are more than 160+  Has not been able to exercise although his weight is stable       Side effects from medications have been: None  Compliance with the medical regimen: Fair  Glucose monitoring:  done 1 + times a day         Glucometer: One Touch Blood Glucose readings by recall:  Mornings about 120+ Nonfasting readings up to 160s    Self-care:  Meal times are:  Breakfast is at 12 noon, dinner usually 7-8 PM  Typical meal intake: Breakfast is near noon,  eggs, sausage, applesauce.   Dinner is chicken or pork, greens, corn; snacks usually nuts, fruits            Dietician visit, most recent: 8/17               Exercise: less walking    Weight history:  Wt Readings from Last 3  Encounters:  06/06/17 191 lb 6.4 oz (86.8 kg)  03/03/17 192 lb 12.8 oz (87.5 kg)  01/16/17 192 lb (87.1 kg)    Glycemic control:   Lab Results  Component Value Date   HGBA1C 7.0 (H) 06/03/2017   HGBA1C 6.8 (H) 02/28/2017   HGBA1C 6.8 (H) 01/14/2017   Lab Results  Component Value Date   MICROALBUR 1.1 01/14/2017   LDLCALC 25 01/14/2017   CREATININE 0.98 06/03/2017   Lab Results  Component Value Date   MICRALBCREAT 15 01/14/2017    Other problems discussed: See review of systems    Allergies as of 06/06/2017   No Known Allergies     Medication List        Accurate as of 06/06/17  9:13 AM. Always use your most recent med list.          amLODipine 10 MG tablet Commonly known as:  NORVASC Take 1 tablet (10 mg total) by mouth daily.   aspirin 81 MG EC tablet Take 1 tablet (81 mg total) by mouth daily.   atorvastatin 10 MG tablet Commonly known  as:  LIPITOR TAKE 1 TABLET AT SUPPER FOR LIPID LOWERING   blood glucose meter kit and supplies Dispense based on patient and insurance preference. Test twice daily; dx code: T70.1   BYSTOLIC 10 MG tablet Generic drug:  nebivolol TAKE 1 TABLET DAILY   cloNIDine 0.1 MG tablet Commonly known as:  CATAPRES Take 1 tablet (0.1 mg total) by mouth 2 (two) times daily. Please keep up coming appt for further refills.   econazole nitrate 1 % cream APPLY TOPICALLY DAILY   fluticasone 50 MCG/ACT nasal spray Commonly known as:  FLONASE USE TWO SPRAY(S) IN EACH NOSTRIL ONCE DAILY   freestyle lancets   furosemide 40 MG tablet Commonly known as:  LASIX Take 1 tablet (40 mg total) by mouth daily.   glipiZIDE 2.5 MG 24 hr tablet Commonly known as:  GLUCOTROL XL TAKE 1 TABLET DAILY WITH BREAKFAST   LORazepam 1 MG tablet Commonly known as:  ATIVAN Take 1 tablet (1 mg total) by mouth at bedtime as needed.   losartan 100 MG tablet Commonly known as:  COZAAR Take 1 tablet (100 mg total) by mouth daily.   metFORMIN 500 MG  24 hr tablet Commonly known as:  GLUCOPHAGE-XR Take 1 tablet (500 mg total) by mouth daily with supper.   ONETOUCH VERIO test strip Generic drug:  glucose blood USE TO CHECK BLOOD SUGAR TWICE A DAY   SitaGLIPtin-MetFORMIN HCl 50-500 MG Tb24 Commonly known as:  JANUMET XR Take 2 tablets by mouth daily.       Allergies: No Known Allergies  Past Medical History:  Diagnosis Date  . Allergy   . Diabetes mellitus   . Hypertension   . Insomnia     Past Surgical History:  Procedure Laterality Date  . COLONOSCOPY    . ELBOW SURGERY     rt elbow  . HERNIA REPAIR     left inguinal  . KNEE SURGERY     rt and left    Family History  Problem Relation Age of Onset  . Stroke Mother   . Hypertension Brother   . Colon cancer Neg Hx   . Diabetes Neg Hx     Social History:  reports that he has never smoked. He has never used smokeless tobacco. He reports that he does not drink alcohol or use drugs.   Review of Systems   Lipid history: On treatment for about a year with Lipitor 10 mg daily from PCP    Lab Results  Component Value Date   CHOL 78 01/14/2017   HDL 38 (L) 01/14/2017   LDLCALC 25 01/14/2017   TRIG 73 01/14/2017   CHOLHDL 2.1 01/14/2017           Hypertension:Treated for >15 years and followed by PCP  Home BP not checked   BP Readings from Last 3 Encounters:  06/06/17 140/88  03/03/17 139/79  01/16/17 130/78     Most recent eye exam was 05/2016  Most recent foot exam: 12/2016 by PCP    LABS:  Office Visit on 06/06/2017  Component Date Value Ref Range Status  . POC Glucose 06/06/2017 126* 70 - 99 mg/dl Final  Lab on 06/03/2017  Component Date Value Ref Range Status  . Sodium 06/03/2017 138  135 - 145 mEq/L Final  . Potassium 06/03/2017 3.9  3.5 - 5.1 mEq/L Final  . Chloride 06/03/2017 101  96 - 112 mEq/L Final  . CO2 06/03/2017 28  19 - 32 mEq/L Final  . Glucose,  Bld 06/03/2017 122* 70 - 99 mg/dL Final  . BUN 06/03/2017 12  6 - 23 mg/dL  Final  . Creatinine, Ser 06/03/2017 0.98  0.40 - 1.50 mg/dL Final  . Calcium 06/03/2017 9.5  8.4 - 10.5 mg/dL Final  . GFR 06/03/2017 98.53  >60.00 mL/min Final  . Hgb A1c MFr Bld 06/03/2017 7.0* 4.6 - 6.5 % Final   Glycemic Control Guidelines for People with Diabetes:Non Diabetic:  <6%Goal of Therapy: <7%Additional Action Suggested:  >8%     Physical Examination:  BP 140/88 (BP Location: Left Arm, Patient Position: Sitting, Cuff Size: Normal)   Pulse 69   Ht _0  (1.626 m)   Wt 191 lb 6.4 oz (86.8 kg)   SpO2 98%   BMI 32.85 kg/m        ASSESSMENT:  Diabetes type 2 on oral agents  See history of present illness for detailed discussion of current diabetes management, blood sugar patterns and problems identified  He has fair control with A1c 7 and higher than before  He tends to have higher postprandial readings although sometimes higher in the morning at home Not able to review his home blood sugars today Does not report hypoglycemia with low-dose glipizide but he thinks blood sugars are better when he takes extra metformin ER   HYPERTENSION: Blood pressure not consistently controlled, followed by PCP and cardiologist  He will discuss with cardiology at upcoming visit  PLAN:    He will take additional metformin ER 500 mg daily at dinnertime and try to have a full meal at that time for better tolerability  Start regular exercise when he has his knee injected and able to walk again  More consistent monitoring and follow-up in 3 months with A1c again  May consider Trulicity if he is not able to get consistent control or has weight gain    There are no Patient Instructions on file for this visit.    Elayne Snare 06/06/2017, 9:13 AM   Note: This office note was prepared with Dragon voice recognition system technology. Any transcriptional errors that result from this process are unintentional.

## 2017-06-09 ENCOUNTER — Encounter: Payer: Self-pay | Admitting: Cardiology

## 2017-06-09 ENCOUNTER — Ambulatory Visit (INDEPENDENT_AMBULATORY_CARE_PROVIDER_SITE_OTHER): Payer: Managed Care, Other (non HMO) | Admitting: Cardiology

## 2017-06-09 VITALS — BP 126/74 | HR 68 | Ht 64.0 in | Wt 191.0 lb

## 2017-06-09 DIAGNOSIS — I10 Essential (primary) hypertension: Secondary | ICD-10-CM | POA: Diagnosis not present

## 2017-06-09 DIAGNOSIS — I119 Hypertensive heart disease without heart failure: Secondary | ICD-10-CM | POA: Diagnosis not present

## 2017-06-09 DIAGNOSIS — E785 Hyperlipidemia, unspecified: Secondary | ICD-10-CM | POA: Diagnosis not present

## 2017-06-09 NOTE — Patient Instructions (Signed)
Medication Instructions:   Your physician recommends that you continue on your current medications as directed. Please refer to the Current Medication list given to you today.    Follow-Up:  Your physician wants you to follow-up in: ONE YEAR WITH DR NELSON You will receive a reminder letter in the mail two months in advance. If you Maruice't receive a letter, please call our office to schedule the follow-up appointment.        If you need a refill on your cardiac medications before your next appointment, please call your pharmacy.   

## 2017-06-09 NOTE — Progress Notes (Signed)
Patient ID: CONG HIGHTOWER, male   DOB: 1950-11-15, 67 y.o.   MRN: [id number]    Patient Name: Mike Parker Date of Encounter: 06/09/2017  Primary Care Provider:  Margaree Mackintosh, MD Primary Cardiologist:  Tobias Alexander  Problem List   Past Medical History:  Diagnosis Date  . Allergy   . Diabetes mellitus   . Hypertension   . Insomnia    Past Surgical History:  Procedure Laterality Date  . COLONOSCOPY    . ELBOW SURGERY     rt elbow  . HERNIA REPAIR     left inguinal  . KNEE SURGERY     rt and left   Allergies  No Known Allergies  HPI  A very pleasant 67 year old male with long-standing history of hypertension and type 2 diabetes mellitus. He had a left thalamic stroke June 2014 with almost complete recovery. His blood pressures been elevated since that time surrounding the stroke. He is on multiple BP medication regimen, recently his Atenolol was changed to Bystolic (nebivolol), lasix and amlodipine were added.   09/04/2015 - the patient is coming after 6 months, he has retired in February 2017, and admits that it's not being as active as he would like to be. He denies any symptoms of shortness of breath or chest pain, no lower extremity edema claudication or palpitations. However at the last PCP visit he was found to have elevated blood sugars and hemoglobin A1c of 9%. He has been referred to an endocrinologist but he didn't have good experience. He is otherwise enjoying his life with his grandchildren and overall feels good.  03/14/2016 - 6 months follow up, no complaints, BP has been well controlled, no new symptoms suspicious of stroke. No DOE, CP< palpitations, medications well tolerated.   06/09/2017 - 1 year follow up, no complains, exercises 3x/week with no CP or DOE. NO LE edema, claudications, complaint with meds and has no side effects.  Home Medications  Prior to Admission medications   Medication Sig Start Date End Date Taking? Authorizing Provider  amLODipine  (NORVASC) 5 MG tablet TAKE 1 TABLET DAILY 07/11/12  Yes Margaree Mackintosh, MD  aspirin EC 81 MG EC tablet Take 1 tablet (81 mg total) by mouth daily. 07/15/12  Yes Nishant Dhungel, MD  atorvastatin (LIPITOR) 10 MG tablet  07/24/12  Yes Historical Provider, MD  fluticasone (FLONASE) 50 MCG/ACT nasal spray Place 2 sprays into both nostrils daily. 01/05/13  Yes Margaree Mackintosh, MD  FREESTYLE LITE test strip  11/23/11  Yes Historical Provider, MD  furosemide (LASIX) 40 MG tablet Take 1 tablet (40 mg total) by mouth daily. 09/14/12  Yes Margaree Mackintosh, MD  glipiZIDE (GLUCOTROL XL) 10 MG 24 hr tablet Take 1 tablet (10 mg total) by mouth daily. 03/20/12 03/20/13 Yes Margaree Mackintosh, MD  JANUMET 50-500 MG per tablet Take 2 tablets by mouth 2 (two) times daily with a meal. 07/15/12  Yes Nishant Dhungel, MD  Lancets (FREESTYLE) lancets  11/23/11  Yes Historical Provider, MD  LORazepam (ATIVAN) 1 MG tablet  07/24/12  Yes Historical Provider, MD  losartan (COZAAR) 100 MG tablet Take 1 tablet (100 mg total) by mouth daily. 09/14/12  Yes Margaree Mackintosh, MD  nebivolol (BYSTOLIC) 10 MG tablet Take 1 tablet (10 mg total) by mouth daily. 01/05/13  Yes Margaree Mackintosh, MD    Family History  Family History  Problem Relation Age of Onset  . Stroke Mother   . Hypertension Brother   .  Colon cancer Neg Hx   . Diabetes Neg Hx     Social History  Social History   Socioeconomic History  . Marital status: Married    Spouse name: Not on file  . Number of children: 2  . Years of education: BS  . Highest education level: Not on file  Occupational History  . 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 Financial resource strain: Not on file  . Food insecurity:    Worry: Not on file    Inability: Not on file  . Transportation needs:    Medical: Not on file    Non-medical: Not on file  Tobacco Use  . Smoking status: Never Smoker  . Smokeless tobacco: Never Used  Substance and Sexual Activity  . Alcohol use: No  . Drug  use: No  . Sexual activity: Yes  Lifestyle  . Physical activity:    Days per week: Not on file    Minutes per session: Not on file  . Stress: Not on file  Relationships  . Social connections:    Talks on phone: Not on file    Gets together: Not on file    Attends religious service: Not on file    Active member of club or organization: Not on file    Attends meetings of clubs or organizations: Not on file    Relationship status: Not on file  . Intimate partner violence:    Fear of current or ex partner: Not on file    Emotionally abused: Not on file    Physically abused: Not on file    Forced sexual activity: Not on file  Other Topics Concern  . Not on file  Social History Narrative  . Not on file     Review of Systems, as per HPI, otherwise negative General:  No chills, fever, night sweats or weight changes.  Cardiovascular:  No chest pain, dyspnea on exertion, edema, orthopnea, palpitations, paroxysmal nocturnal dyspnea. Dermatological: No rash, lesions/masses Respiratory: No cough, dyspnea Urologic: No hematuria, dysuria Abdominal:   No nausea, vomiting, diarrhea, bright red blood per rectum, melena, or hematemesis Neurologic:  No visual changes, wkns, changes in mental status. All other systems reviewed and are otherwise negative except as noted above.  Physical Exam  Blood pressure 126/74, pulse 68, height  (1.626 m), weight 191 lb (86.6 kg).  General: Pleasant, NAD Psych: Normal affect. Neuro: Alert and oriented X 3. Moves all extremities spontaneously. HEENT: Normal  Neck: Supple without bruits or JVD. Lungs:  Resp regular and unlabored, CTA. Heart: RRR no s3, s4, or murmurs. Abdomen: Soft, non-tender, non-distended, BS + x 4.  Extremities: No clubbing, cyanosis or edema. DP/PT/Radials 2+ and equal bilaterally.  Labs:  No results for input(s): CKTOTAL, CKMB, TROPONINI in the last 72 hours. Lab Results  Component Value Date   WBC 7.7 07/23/2016   HGB  14.1 07/23/2016   HCT 44.5 07/23/2016   MCV 80.9 07/23/2016   PLT 210 07/23/2016    Recent Labs  Lab 06/03/17 0833  NA 138  K 3.9  CL 101  CO2 28  BUN 12  CREATININE 0.98  CALCIUM 9.5  GLUCOSE 122*   Lab Results  Component Value Date   CHOL 78 01/14/2017   HDL 38 (L) 01/14/2017   LDLCALC 25 01/14/2017   TRIG 73 01/14/2017   Accessory Clinical Findings  ECG - SR, normal ECG, unchanged from prior, personally reviewed  Echocardiogram 07/11/2012  - Left ventricle: The cavity  size was normal. There was moderate concentric hypertrophy. Systolic function was normal. The estimated ejection fraction was in the range of 60% to 65%. Wall motion was normal; there were no regional wall motion abnormalities. - Left atrium: The atrium was mildly dilated.  EKG performed on 09/14/2015 showed normal sinus rhythm normal ECG and unchanged from prior.   Assessment & Plan  A pleasant 67 year old male with h/o NIDDM and thalamic stroke in June 2014  1. Resistant hypertension - with h/o stroke and an evidence of moderate LVH with diastolic dysfunction on echocardiogram. A renal arterial duplex was negative for stenosis, both kidneys are normal size. Carotid ultrasound normal in 2014, no new symptoms, no bruit no need to repeat. BP controlled on current regimen that is well tolerated.  BP with excellent control now, ECG shows no LVH.  2. Hyperlipidemia - on atorvastatin 10 mg po daily, well tolerated, all of his lipids at goal in December 2018 for primary CAD prevention.  3. DM - HbA1c 9.2%, he is advised to find a new endocrinologist.  Follow up in 1 year.   Tobias Alexander, MD, [facility]  06/09/2017, 8:36 AM

## 2017-06-17 ENCOUNTER — Other Ambulatory Visit: Payer: Self-pay | Admitting: Cardiology

## 2017-06-17 MED ORDER — CLONIDINE HCL 0.1 MG PO TABS: 0.1000 mg | ORAL_TABLET | Freq: Two times a day (BID) | ORAL | 3 refills | Status: DC

## 2017-07-09 ENCOUNTER — Other Ambulatory Visit: Payer: Self-pay

## 2017-07-09 DIAGNOSIS — E7849 Other hyperlipidemia: Secondary | ICD-10-CM

## 2017-07-09 DIAGNOSIS — E786 Lipoprotein deficiency: Secondary | ICD-10-CM

## 2017-07-09 DIAGNOSIS — Z0000 Encounter for general adult medical examination without abnormal findings: Secondary | ICD-10-CM

## 2017-07-09 DIAGNOSIS — E119 Type 2 diabetes mellitus without complications: Secondary | ICD-10-CM

## 2017-07-09 DIAGNOSIS — Z125 Encounter for screening for malignant neoplasm of prostate: Secondary | ICD-10-CM

## 2017-07-09 DIAGNOSIS — E8881 Metabolic syndrome: Secondary | ICD-10-CM

## 2017-07-09 DIAGNOSIS — I10 Essential (primary) hypertension: Secondary | ICD-10-CM

## 2017-07-25 ENCOUNTER — Other Ambulatory Visit: Payer: Medicare Other | Admitting: Internal Medicine

## 2017-07-25 DIAGNOSIS — E119 Type 2 diabetes mellitus without complications: Secondary | ICD-10-CM | POA: Diagnosis not present

## 2017-07-25 DIAGNOSIS — I10 Essential (primary) hypertension: Secondary | ICD-10-CM | POA: Diagnosis not present

## 2017-07-25 DIAGNOSIS — E786 Lipoprotein deficiency: Secondary | ICD-10-CM

## 2017-07-25 DIAGNOSIS — E7849 Other hyperlipidemia: Secondary | ICD-10-CM

## 2017-07-25 DIAGNOSIS — Z125 Encounter for screening for malignant neoplasm of prostate: Secondary | ICD-10-CM

## 2017-07-25 DIAGNOSIS — E8881 Metabolic syndrome: Secondary | ICD-10-CM

## 2017-07-25 DIAGNOSIS — Z0000 Encounter for general adult medical examination without abnormal findings: Secondary | ICD-10-CM | POA: Diagnosis not present

## 2017-07-26 LAB — COMPLETE METABOLIC PANEL WITH GFR
AG Ratio: 1.7 (calc) (ref 1.0–2.5)
ALKALINE PHOSPHATASE (APISO): 66 U/L (ref 40–115)
ALT: 18 U/L (ref 9–46)
AST: 16 U/L (ref 10–35)
Albumin: 4.2 g/dL (ref 3.6–5.1)
BUN: 12 mg/dL (ref 7–25)
CO2: 25 mmol/L (ref 20–32)
Calcium: 9.6 mg/dL (ref 8.6–10.3)
Chloride: 103 mmol/L (ref 98–110)
Creat: 1.12 mg/dL (ref 0.70–1.25)
GFR, Est African American: 79 mL/min/{1.73_m2} (ref >=60)
GFR, Est Non African American: 69 mL/min/{1.73_m2} (ref >=60)
GLUCOSE: 96 mg/dL (ref 65–99)
Globulin: 2.5 g/dL (calc) (ref 1.9–3.7)
Potassium: 4.5 mmol/L (ref 3.5–5.3)
SODIUM: 140 mmol/L (ref 135–146)
Total Bilirubin: 0.9 mg/dL (ref 0.2–1.2)
Total Protein: 6.7 g/dL (ref 6.1–8.1)

## 2017-07-26 LAB — CBC WITH DIFFERENTIAL/PLATELET
BASOS PCT: 0.7 %
Basophils Absolute: 43 cells/uL (ref 0–200)
Eosinophils Absolute: 262 cells/uL (ref 15–500)
Eosinophils Relative: 4.3 %
HCT: 42.8 % (ref 38.5–50.0)
Hemoglobin: 13.9 g/dL (ref 13.2–17.1)
LYMPHS ABS: 1360 {cells}/uL (ref 850–3900)
MCH: 26 pg — ABNORMAL LOW (ref 27.0–33.0)
MCHC: 32.5 g/dL (ref 32.0–36.0)
MCV: 80.1 fL (ref 80.0–100.0)
MPV: 11.2 fL (ref 7.5–12.5)
Monocytes Relative: 9 %
Neutro Abs: 3886 cells/uL (ref 1500–7800)
Neutrophils Relative %: 63.7 %
PLATELETS: 224 10*3/uL (ref 140–400)
RBC: 5.34 10*6/uL (ref 4.20–5.80)
RDW: 13.9 % (ref 11.0–15.0)
TOTAL LYMPHOCYTE: 22.3 %
WBC mixed population: 549 cells/uL (ref 200–950)
WBC: 6.1 10*3/uL (ref 3.8–10.8)

## 2017-07-26 LAB — MICROALBUMIN / CREATININE URINE RATIO
CREATININE, URINE: 111 mg/dL (ref 20–320)
MICROALB UR: 1.1 mg/dL
MICROALB/CREAT RATIO: 10 ug/mg{creat} (ref <30)

## 2017-07-26 LAB — PSA: PSA: 1.2 ng/mL (ref <=4.0)

## 2017-07-26 LAB — LIPID PANEL
CHOL/HDL RATIO: 2.2 (calc) (ref <5.0)
CHOLESTEROL: 73 mg/dL (ref <200)
HDL: 33 mg/dL — AB (ref >40)
LDL CHOLESTEROL (CALC): 22 mg/dL
NON-HDL CHOLESTEROL (CALC): 40 mg/dL (ref <130)
Triglycerides: 97 mg/dL (ref <150)

## 2017-07-26 LAB — HEMOGLOBIN A1C
Hgb A1c MFr Bld: 6.5 % of total Hgb — ABNORMAL HIGH (ref <5.7)
Mean Plasma Glucose: 140 (calc)
eAG (mmol/L): 7.7 (calc)

## 2017-07-28 ENCOUNTER — Encounter: Payer: Self-pay | Admitting: Internal Medicine

## 2017-07-28 ENCOUNTER — Ambulatory Visit (INDEPENDENT_AMBULATORY_CARE_PROVIDER_SITE_OTHER): Payer: Managed Care, Other (non HMO) | Admitting: Internal Medicine

## 2017-07-28 VITALS — BP 120/80 | HR 72 | Temp 98.1°F | Ht 64.0 in | Wt 190.0 lb

## 2017-07-28 DIAGNOSIS — Z0000 Encounter for general adult medical examination without abnormal findings: Secondary | ICD-10-CM

## 2017-07-28 DIAGNOSIS — B353 Tinea pedis: Secondary | ICD-10-CM

## 2017-07-28 DIAGNOSIS — E119 Type 2 diabetes mellitus without complications: Secondary | ICD-10-CM

## 2017-07-28 DIAGNOSIS — Z6832 Body mass index (BMI) 32.0-32.9, adult: Secondary | ICD-10-CM | POA: Diagnosis not present

## 2017-07-28 DIAGNOSIS — E785 Hyperlipidemia, unspecified: Secondary | ICD-10-CM | POA: Diagnosis not present

## 2017-07-28 DIAGNOSIS — Z23 Encounter for immunization: Secondary | ICD-10-CM | POA: Diagnosis not present

## 2017-07-28 DIAGNOSIS — Z8673 Personal history of transient ischemic attack (TIA), and cerebral infarction without residual deficits: Secondary | ICD-10-CM | POA: Diagnosis not present

## 2017-07-28 DIAGNOSIS — E786 Lipoprotein deficiency: Secondary | ICD-10-CM

## 2017-07-28 DIAGNOSIS — E1169 Type 2 diabetes mellitus with other specified complication: Secondary | ICD-10-CM | POA: Diagnosis not present

## 2017-07-28 DIAGNOSIS — I10 Essential (primary) hypertension: Secondary | ICD-10-CM | POA: Diagnosis not present

## 2017-07-28 LAB — POCT URINALYSIS DIPSTICK
Appearance: NORMAL
Bilirubin, UA: NEGATIVE
Glucose, UA: NEGATIVE
KETONES UA: NEGATIVE
LEUKOCYTES UA: NEGATIVE
NITRITE UA: NEGATIVE
Odor: NORMAL
PH UA: 7.5 (ref 5.0–8.0)
PROTEIN UA: NEGATIVE
RBC UA: NEGATIVE
SPEC GRAV UA: 1.01 (ref 1.010–1.025)
UROBILINOGEN UA: 0.2 U/dL

## 2017-07-28 MED ORDER — ECONAZOLE NITRATE 1 % EX CREA: TOPICAL_CREAM | Freq: Every day | CUTANEOUS | 5 refills | Status: AC

## 2017-07-28 NOTE — Patient Instructions (Signed)
It was a pleasure to see you today.  Keep up the good work with controlling your diabetes.  Your lipid panel is normal.  Your hemoglobin A1c is excellent.  Prevnar 13 given today.  Prescription given for Shingrix vaccine at pharmacy.  Return in 6 months.  Continue diet and exercise efforts.

## 2017-07-28 NOTE — Progress Notes (Signed)
Subjective:    Patient ID: Mike Parker, male    DOB: [birth year], 67 y.o.   MRN: [id number]  HPI 67 year old Male in today for Welcome to Medicare physical examination and evaluation of medical issues.  His hemoglobin A1c is excellent at 6.5%.  His lipid panel is normal.  Continues to enjoy retirement and spending time with grandchildren.  History of essential hypertension, controlled type 2 diabetes mellitus, hyperlipidemia, metabolic syndrome, obesity.  He had a left thalamic CVA June 2014.  2D echocardiogram at that time showed moderate LVH and diastolic dysfunction.  Renal artery duplex scan was negative for renal artery stenosis.  He still has some paresthesias in his right hand status post CVA but has recovered well.  He was evaluated for sleep apnea several years ago.  Sleep study was not done.  Dr. Mat Carne is thought he just needed to lose weight.  Indeed he has lost some weight.  In June 2017 he weighed 208 pounds now weighs 190 pounds.  He takes Ativan for insomnia which started after his stroke.  The stroke frighten him.  His mother died of a stroke and it was disconcerting to him.  History of frozen shoulder previously treated by Dr. Margaree Mackintosh.  No known drug allergies  Social history: He is married.  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  2 adult daughters and 2 grandchildren.  Does not smoke or consume alcohol.  Wife continues to work at [employer].   Review of Systems  Constitutional: Negative.   Skin:       Tinea pedis     All other systems reviewed and are negative.      Objective:   Physical Exam  Constitutional: He is oriented to person, place, and time.  HENT:  Head: Normocephalic and atraumatic.  Right Ear: External ear normal.  Left Ear: External ear normal.  Nose: Nose normal.  Mouth/Throat: Oropharynx is clear and moist.  Eyes: Pupils are equal, round, and reactive to light. Conjunctivae and EOM are normal. Right  eye exhibits no discharge. Left eye exhibits no discharge.  Neck: Neck supple. No JVD present. No thyromegaly present.  Cardiovascular: Normal heart sounds and intact distal pulses. Exam reveals no gallop and no friction rub.  No murmur heard. Pulmonary/Chest: Effort normal and breath sounds normal. No stridor. No respiratory distress. He has no wheezes. He has no rales.  Abdominal: Soft. Bowel sounds are normal. He exhibits no distension and no mass. There is no tenderness. There is no rebound and no guarding. No hernia.  Genitourinary: Prostate normal.  Musculoskeletal: He exhibits no edema.  Lymphadenopathy:    He has no cervical adenopathy.  Neurological: He is alert and oriented to person, place, and time. He displays normal reflexes. No cranial nerve deficit or sensory deficit. He exhibits normal muscle tone. Coordination normal.  Skin: Skin is warm and dry. No rash noted. No erythema.  Psychiatric: He has a normal mood and affect. His behavior is normal. Judgment and thought content normal.  Vitals reviewed.  His blood pressure was elevated on arrival at 150/90 but rechecked and was 120/80.  Welcome to Medicare EKG is within normal limits.       Assessment & Plan:  Essential hypertension-stable on current regimen.  Repeat reading after arrival was normal and he will continue with current medications  History of left thalamic CVA with resultant paresthesia and hand but no weakness  Controlled type 2 diabetes mellitus-endocrinologist  is helped to get perspective on controlling his diabetes and weight.  He is doing better.  Metabolic syndrome  Hyperlipidemia-stable on statin therapy.  History of low HDL  Plan: Continue same medications and return in 6 months.  Reminded about annual eye exam.  Subjective:   Patient presents for Medicare Annual/Subsequent preventive examination.  Review Past Medical/Family/Social: See above   Risk Factors  Current exercise habits: Does  things with grandchildren but probably should exercise more Dietary issues discussed: Low-fat low carbohydrate  Cardiac risk factors: Hypertension, hyperlipidemia, history of stroke  Depression Screen  (Note: if answer to either of the following is "Yes", a more complete depression screening is indicated)   Over the past two weeks, have you felt down, depressed or hopeless? No  Over the past two weeks, have you felt little interest or pleasure in doing things? No Have you lost interest or pleasure in daily life? No Do you often feel hopeless? No Do you cry easily over simple problems? No   Activities of Daily Living  In your present state of health, do you have any difficulty performing the following activities?:   Driving? No  Managing money? No  Feeding yourself? No  Getting from bed to chair? No  Climbing a flight of stairs? No  Preparing food and eating?: No  Bathing or showering? No  Getting dressed: No  Getting to the toilet? No  Using the toilet:No  Moving around from place to place: No  In the past year have you fallen or had a near fall?:No  Are you sexually active?  yes Do you have more than one partner? No   Hearing Difficulties: No  Do you often ask people to speak up or repeat themselves? No  Do you experience ringing or noises in your ears? is Do you have difficulty understanding soft or whispered voices? No  Do you feel that you have a problem with memory? No Do you often misplace items? No    Home Safety:  Do you have a smoke alarm at your residence? Yes Do you have grab bars in the bathroom?  No Do you have throw rugs in your house? Yes  Cognitive Testing  Alert? Yes Normal Appearance?Yes  Oriented to person? Yes Place? Yes  Time? Yes  Recall of three objects? Yes  Can perform simple calculations? Yes  Displays appropriate judgment?Yes  Can read the correct time from a watch face?Yes   List the Names of Other Physician/Practitioners you  currently use:  See referral list for the physicians patient is currently seeing.  Endocrinologist   Review of Systems: See above   Objective:     General appearance: Appears stated age and mildly obese  Head: Normocephalic, without obvious abnormality, atraumatic  Eyes: conj clear, EOMi PEERLA  Ears: normal TM's and external ear canals both ears  Nose: Nares normal. Septum midline. Mucosa normal. No drainage or sinus tenderness.  Throat: lips, mucosa, and tongue normal; teeth and gums normal  Neck: no adenopathy, no carotid bruit, no JVD, supple, symmetrical, trachea midline and thyroid not enlarged, symmetric, no tenderness/mass/nodules  No CVA tenderness.  Lungs: clear to auscultation bilaterally  Breasts: normal appearance, no masses or tenderness Heart: regular rate and rhythm, S1, S2 normal, no murmur, click, rub or gallop  Abdomen: soft, non-tender; bowel sounds normal; no masses, no organomegaly  Musculoskeletal: ROM normal in all joints, no crepitus, no deformity, Normal muscle strengthen. Back  is symmetric, no curvature. Skin: Skin color, texture, turgor normal.  No rashes or lesions  Lymph nodes: Cervical, supraclavicular, and axillary nodes normal.  Neurologic: CN 2 -12 Normal, Normal symmetric reflexes. Normal coordination and gait  Psych: Alert & Oriented x 3, Mood appear stable.    Assessment:    Annual wellness medicare exam   Plan:    During the course of the visit the patient was educated and counseled about appropriate screening and preventive services including:   Prevnar 13  Needs diabetic eye exam     Patient Instructions (the written plan) was given to the patient.  Medicare Attestation  I have personally reviewed:  The patient's medical and social history  Their use of alcohol, tobacco or illicit drugs  Their current medications and supplements  The patient's functional ability including ADLs,fall risks, home safety risks, cognitive, and  hearing and visual impairment  Diet and physical activities  Evidence for depression or mood disorders  The patient's weight, height, BMI, and visual acuity have been recorded in the chart. I have made referrals, counseling, and provided education to the patient based on review of the above and I have provided the patient with a written personalized care plan for preventive services.

## 2017-09-05 ENCOUNTER — Other Ambulatory Visit (INDEPENDENT_AMBULATORY_CARE_PROVIDER_SITE_OTHER): Payer: Managed Care, Other (non HMO)

## 2017-09-05 DIAGNOSIS — E1165 Type 2 diabetes mellitus with hyperglycemia: Secondary | ICD-10-CM

## 2017-09-05 LAB — BASIC METABOLIC PANEL
BUN: 14 mg/dL (ref 6–23)
CALCIUM: 9.6 mg/dL (ref 8.4–10.5)
CO2: 28 mEq/L (ref 19–32)
CREATININE: 1.17 mg/dL (ref 0.40–1.50)
Chloride: 104 mEq/L (ref 96–112)
GFR: 80.24 mL/min (ref >60.00)
Glucose, Bld: 113 mg/dL — ABNORMAL HIGH (ref 70–99)
Potassium: 4 mEq/L (ref 3.5–5.1)
SODIUM: 141 meq/L (ref 135–145)

## 2017-09-05 LAB — HEMOGLOBIN A1C: Hgb A1c MFr Bld: 6.7 % — ABNORMAL HIGH (ref 4.6–6.5)

## 2017-09-08 ENCOUNTER — Encounter: Payer: Self-pay | Admitting: Endocrinology

## 2017-09-08 ENCOUNTER — Ambulatory Visit (INDEPENDENT_AMBULATORY_CARE_PROVIDER_SITE_OTHER): Payer: Managed Care, Other (non HMO) | Admitting: Endocrinology

## 2017-09-08 VITALS — BP 140/82 | HR 72 | Ht 64.0 in | Wt 190.8 lb

## 2017-09-08 DIAGNOSIS — E1169 Type 2 diabetes mellitus with other specified complication: Secondary | ICD-10-CM

## 2017-09-08 DIAGNOSIS — E669 Obesity, unspecified: Secondary | ICD-10-CM | POA: Diagnosis not present

## 2017-09-08 DIAGNOSIS — E1165 Type 2 diabetes mellitus with hyperglycemia: Secondary | ICD-10-CM

## 2017-09-08 NOTE — Patient Instructions (Signed)
Check blood sugars on waking up  2-3/7  Also check blood sugars about 2 hours after a meal and do this after different meals by rotation  Recommended blood sugar levels on waking up is 90-130 and about 2 hours after meal is 130-160  Please bring your blood sugar monitor to each visit, thank you  Take metformin at lunch

## 2017-09-08 NOTE — Progress Notes (Signed)
Patient ID: Mike Parker, male   DOB: 06-Mar-1950, 67 y.o.   MRN: [id number]           Reason for Appointment: Follow-up for Type 2 Diabetes     History of Present Illness:          Date of diagnosis of type 2 diabetes mellitus: ?  2011        Background history:   He appears to have been on glipizide and Janumet for several years with fair control His A1c had been over 7% since 2015 He was referred here because of an A1c of 9.2% done in 6/17  Recent history:   His A1c has improved to 6.7, previous range 6.4-7 more recently   Non-insulin hypoglycemic drugs the patient is taking are: Janumet 50/500 xr twice a day, glipizide ER 2.5 mg daily, metformin ER 500 mg PCS  Current management, blood sugar patterns and problems identified:   He has somewhat better blood sugar control since he has started taking extra metformin in addition to his Janumet  He now says that his main meal is at lunchtime in the afternoon around 2 PM and if he takes his metformin in the evening he will have more GI distress and only will take metformin about 4 days a week  However his blood sugars are excellent at home although checking mostly in the mornings  He has only rare blood sugars after meals only one reading at night that was relatively high at 172 but otherwise his readings in the afternoon and evening are fairly good  He is not checking his blood sugars after his main meal at lunchtime  He has been able to start walking back again with less knee problems  Fasting readings may still tend to be relatively high, highest 144        Side effects from medications have been: Diarrhea from high-dose metformin  Compliance with the medical regimen: Fair  Glucose monitoring:  done 1 + times a day         Glucometer: One Touch Blood Glucose readings by download:  Morning readings range from 86-144  Nonfasting readings range from 95 up to 172  Overall MEDIAN 118    Self-care:  Meal times are:   Breakfast is at 12 noon, dinner usually 7-8 PM  Typical meal intake: Breakfast is near noon egg or meat/ cereal.    Dinner is chicken or pork, greens, corn; snacks usually nuts, fruits            Dietician visit, most recent: 8/17               Exercise: walking 3/7 days a week  Weight history:  Wt Readings from Last 3 Encounters:  09/08/17 190 lb 12.8 oz (86.5 kg)  07/28/17 190 lb (86.2 kg)  06/09/17 191 lb (86.6 kg)    Glycemic control:   Lab Results  Component Value Date   HGBA1C 6.7 (H) 09/05/2017   HGBA1C 6.5 (H) 07/25/2017   HGBA1C 7.0 (H) 06/03/2017   Lab Results  Component Value Date   MICROALBUR 1.1 07/25/2017   LDLCALC 22 07/25/2017   CREATININE 1.17 09/05/2017   Lab Results  Component Value Date   MICRALBCREAT 10 07/25/2017    Other problems discussed: See review of systems    Allergies as of 09/08/2017   No Known Allergies     Medication List        Accurate as of 09/08/17 11:48 AM. Always use  your most recent med list.          amLODipine 10 MG tablet Commonly known as:  NORVASC Take 1 tablet (10 mg total) by mouth daily.   aspirin 81 MG EC tablet Take 1 tablet (81 mg total) by mouth daily.   atorvastatin 10 MG tablet Commonly known as:  LIPITOR TAKE 1 TABLET AT SUPPER FOR LIPID LOWERING   blood glucose meter kit and supplies Dispense based on patient and insurance preference. Test twice daily; dx code: K27.0   BYSTOLIC 10 MG tablet Generic drug:  nebivolol TAKE 1 TABLET DAILY   cloNIDine 0.1 MG tablet Commonly known as:  CATAPRES Take 1 tablet (0.1 mg total) by mouth 2 (two) times daily.   econazole nitrate 1 % cream Apply topically daily.   fluticasone 50 MCG/ACT nasal spray Commonly known as:  FLONASE USE TWO SPRAY(S) IN EACH NOSTRIL ONCE DAILY   freestyle lancets   furosemide 40 MG tablet Commonly known as:  LASIX Take 1 tablet (40 mg total) by mouth daily.   glipiZIDE 2.5 MG 24 hr tablet Commonly known as:   GLUCOTROL XL TAKE 1 TABLET DAILY WITH BREAKFAST   LORazepam 1 MG tablet Commonly known as:  ATIVAN Take 1 tablet (1 mg total) by mouth at bedtime as needed.   losartan 100 MG tablet Commonly known as:  COZAAR Take 1 tablet (100 mg total) by mouth daily.   metFORMIN 500 MG 24 hr tablet Commonly known as:  GLUCOPHAGE-XR Take 1 tablet (500 mg total) by mouth daily with supper.   ONETOUCH VERIO test strip Generic drug:  glucose blood USE TO CHECK BLOOD SUGAR TWICE A DAY   SitaGLIPtin-MetFORMIN HCl 50-500 MG Tb24 Take 2 tablets by mouth daily.       Allergies: No Known Allergies  Past Medical History:  Diagnosis Date  . Allergy   . Diabetes mellitus   . Hypertension   . Insomnia     Past Surgical History:  Procedure Laterality Date  . COLONOSCOPY    . ELBOW SURGERY     rt elbow  . HERNIA REPAIR     left inguinal  . KNEE SURGERY     rt and left    Family History  Problem Relation Age of Onset  . Stroke Mother   . Hypertension Brother   . Colon cancer Neg Hx   . Diabetes Neg Hx     Social History:  reports that he has never smoked. He has never used smokeless tobacco. He reports that he does not drink alcohol or use drugs.   Review of Systems   Lipid history: On treatment for about a year with Lipitor 10 mg daily from PCP    Lab Results  Component Value Date   CHOL 73 07/25/2017   HDL 33 (L) 07/25/2017   LDLCALC 22 07/25/2017   TRIG 97 07/25/2017   CHOLHDL 2.2 07/25/2017           Hypertension:Treated for >15 years and followed by PCP with good control    BP Readings from Last 3 Encounters:  09/08/17 140/82  07/28/17 120/80  06/09/17 126/74     Most recent eye exam was 05/2016  Most recent foot exam: 7/19 by PCP    LABS:  Lab on 09/05/2017  Component Date Value Ref Range Status  . Sodium 09/05/2017 141  135 - 145 mEq/L Final  . Potassium 09/05/2017 4.0  3.5 - 5.1 mEq/L Final  . Chloride 09/05/2017 104  96 -  112 mEq/L Final  . CO2  09/05/2017 28  19 - 32 mEq/L Final  . Glucose, Bld 09/05/2017 113* 70 - 99 mg/dL Final  . BUN 09/05/2017 14  6 - 23 mg/dL Final  . Creatinine, Ser 09/05/2017 1.17  0.40 - 1.50 mg/dL Final  . Calcium 09/05/2017 9.6  8.4 - 10.5 mg/dL Final  . GFR 09/05/2017 80.24  >60.00 mL/min Final  . Hgb A1c MFr Bld 09/05/2017 6.7* 4.6 - 6.5 % Final   Glycemic Control Guidelines for People with Diabetes:Non Diabetic:  <6%Goal of Therapy: <7%Additional Action Suggested:  >8%     Physical Examination:  BP 140/82 (BP Location: Left Arm, Patient Position: Sitting, Cuff Size: Normal)   Pulse 72   Ht _0  (1.626 m)   Wt 190 lb 12.8 oz (86.5 kg)   SpO2 97%   BMI 32.75 kg/m        ASSESSMENT:  Diabetes type 2 on oral agents  See history of present illness for detailed discussion of current diabetes management, blood sugar patterns and problems identified  He has an improved A1c of 6.7  However his blood sugars are excellent at home with average below 120 and only 1 sporadic high reading of 172 He does however need to check more readings after his main meal which is around 2 PM Recently has started doing some walking also which should help Currently no hypoglycemia with low-dose glipizide ER   HYPERTENSION: Blood pressure better controlled recently  PLAN:    He will take the additional metformin ER 500 mg daily with his main meal at 2 PM  Regular exercise and can increase frequency and duration up walking  To check blood sugars around 4 PM more consistently  Call if blood sugars are running higher  At dinnertime and try to have a full meal at that time for better tolerability  No change in Janumet    Patient Instructions  Check blood sugars on waking up  2-3/7  Also check blood sugars about 2 hours after a meal and do this after different meals by rotation  Recommended blood sugar levels on waking up is 90-130 and about 2 hours after meal is 130-160  Please bring your blood sugar  monitor to each visit, thank you  Take metformin at lunch      Elayne Snare 09/08/2017, 11:48 AM   Note: This office note was prepared with Dragon voice recognition system technology. Any transcriptional errors that result from this process are unintentional.

## 2017-09-09 ENCOUNTER — Other Ambulatory Visit: Payer: Self-pay | Admitting: Endocrinology

## 2017-09-25 DIAGNOSIS — M1712 Unilateral primary osteoarthritis, left knee: Secondary | ICD-10-CM | POA: Diagnosis not present

## 2017-11-13 ENCOUNTER — Other Ambulatory Visit: Payer: Self-pay | Admitting: Endocrinology

## 2018-01-07 ENCOUNTER — Other Ambulatory Visit (INDEPENDENT_AMBULATORY_CARE_PROVIDER_SITE_OTHER): Payer: Managed Care, Other (non HMO)

## 2018-01-07 DIAGNOSIS — E669 Obesity, unspecified: Secondary | ICD-10-CM | POA: Diagnosis not present

## 2018-01-07 DIAGNOSIS — E1169 Type 2 diabetes mellitus with other specified complication: Secondary | ICD-10-CM

## 2018-01-07 LAB — COMPREHENSIVE METABOLIC PANEL
ALK PHOS: 73 U/L (ref 39–117)
ALT: 18 U/L (ref 0–53)
AST: 17 U/L (ref 0–37)
Albumin: 4.2 g/dL (ref 3.5–5.2)
BUN: 11 mg/dL (ref 6–23)
CALCIUM: 9.3 mg/dL (ref 8.4–10.5)
CO2: 30 meq/L (ref 19–32)
Chloride: 102 mEq/L (ref 96–112)
Creatinine, Ser: 1.06 mg/dL (ref 0.40–1.50)
GFR: 89.84 mL/min (ref >60.00)
Glucose, Bld: 113 mg/dL — ABNORMAL HIGH (ref 70–99)
Potassium: 4 mEq/L (ref 3.5–5.1)
Sodium: 139 mEq/L (ref 135–145)
Total Bilirubin: 0.6 mg/dL (ref 0.2–1.2)
Total Protein: 7 g/dL (ref 6.0–8.3)

## 2018-01-07 LAB — HEMOGLOBIN A1C: Hgb A1c MFr Bld: 7.1 % — ABNORMAL HIGH (ref 4.6–6.5)

## 2018-01-09 ENCOUNTER — Encounter: Payer: Self-pay | Admitting: Endocrinology

## 2018-01-09 ENCOUNTER — Ambulatory Visit (INDEPENDENT_AMBULATORY_CARE_PROVIDER_SITE_OTHER): Payer: Managed Care, Other (non HMO) | Admitting: Endocrinology

## 2018-01-09 VITALS — BP 130/80 | HR 69 | Ht 64.0 in | Wt 191.0 lb

## 2018-01-09 DIAGNOSIS — E1165 Type 2 diabetes mellitus with hyperglycemia: Secondary | ICD-10-CM

## 2018-01-09 NOTE — Progress Notes (Signed)
Patient ID: Mike Parker, male   DOB: May 06, 1950, 67 y.o.   MRN: [id number]           Reason for Appointment: Follow-up for Type 2 Diabetes     History of Present Illness:          Date of diagnosis of type 2 diabetes mellitus: ?  2011        Background history:   He appears to have been on glipizide and Janumet for several years with fair control His A1c had been over 7% since 2015 He was referred here because of an A1c of 9.2% done in 6/17  Recent history:   His A1c had improved to 6.7, now relatively higher at 7.1   Non-insulin hypoglycemic drugs: Janumet 50/500 xr twice a day, glipizide ER 2.5 mg daily, metformin ER 500 mg PCS  Current management, blood sugar patterns and problems identified:   Unclear why his A1c has gone up even though his blood sugars at various times of the day are fairly good and slightly better overall compared to his last visit  He did have steroid injection in his knee about 6 weeks ago but did not check his sugars consistently around that time  Also he thinks that he is snacking more and meals may not be as well planned since he has grandchildren living with him now  He is still not able to walk for exercise's as much as he needs to with recovering from his knee pain  He does have relatively better fasting readings compared to last visit  However checking blood sugars only about once a day on an average  Postprandial readings are excellent with highest reading only 159  He was recommended taking metformin at lunch which is his larger meal but he tends to forget to do this  No hypoglycemia with glipizide        Side effects from medications have been: Diarrhea from high-dose metformin  Compliance with the medical regimen: Fair  Glucose monitoring:  done 1 + times a day         Glucometer: One Touch Blood Glucose readings by download:   PRE-MEAL Fasting Lunch Dinner Bedtime Overall  Glucose range:  81-138   72, 80  84-159     Mean/median:  108  122   96 108   POST-MEAL PC Breakfast PC Lunch PC Dinner  Glucose range:   99, 121   Mean/median:         Self-care:  Meal times are:  Breakfast is at 12 noon, dinner usually 7-8 PM  Typical meal intake: Breakfast is near noon egg or meat/ cereal.   Dinner is chicken or pork, greens, corn; snacks usually nuts, fruits            Dietician visit, most recent: 8/17               Exercise: walking only a little currently  Weight history:  Wt Readings from Last 3 Encounters:  01/09/18 191 lb (86.6 kg)  09/08/17 190 lb 12.8 oz (86.5 kg)  07/28/17 190 lb (86.2 kg)    Glycemic control:   Lab Results  Component Value Date   HGBA1C 7.1 (H) 01/07/2018   HGBA1C 6.7 (H) 09/05/2017   HGBA1C 6.5 (H) 07/25/2017   Lab Results  Component Value Date   MICROALBUR 1.1 07/25/2017   LDLCALC 22 07/25/2017   CREATININE 1.06 01/07/2018   Lab Results  Component Value Date   MICRALBCREAT 10 07/25/2017  Other problems discussed: See review of systems    Allergies as of 01/09/2018   No Known Allergies     Medication List       Accurate as of January 09, 2018  8:26 AM. Always use your most recent med list.        amLODipine 10 MG tablet Commonly known as:  NORVASC Take 1 tablet (10 mg total) by mouth daily.   aspirin 81 MG EC tablet Take 1 tablet (81 mg total) by mouth daily.   atorvastatin 10 MG tablet Commonly known as:  LIPITOR TAKE 1 TABLET AT SUPPER FOR LIPID LOWERING   blood glucose meter kit and supplies Dispense based on patient and insurance preference. Test twice daily; dx code: L89.3   BYSTOLIC 10 MG tablet Generic drug:  nebivolol TAKE 1 TABLET DAILY   cloNIDine 0.1 MG tablet Commonly known as:  CATAPRES Take 1 tablet (0.1 mg total) by mouth 2 (two) times daily.   econazole nitrate 1 % cream Apply topically daily.   fluticasone 50 MCG/ACT nasal spray Commonly known as:  FLONASE USE TWO SPRAY(S) IN EACH NOSTRIL ONCE DAILY    freestyle lancets   furosemide 40 MG tablet Commonly known as:  LASIX Take 1 tablet (40 mg total) by mouth daily.   glipiZIDE 2.5 MG 24 hr tablet Commonly known as:  GLUCOTROL XL TAKE 1 TABLET DAILY WITH BREAKFAST   JANUMET XR 50-500 MG Tb24 Generic drug:  SitaGLIPtin-MetFORMIN HCl TAKE 2 TABLETS BY MOUTH ONCE DAILY   LORazepam 1 MG tablet Commonly known as:  ATIVAN Take 1 tablet (1 mg total) by mouth at bedtime as needed.   losartan 100 MG tablet Commonly known as:  COZAAR Take 1 tablet (100 mg total) by mouth daily.   metFORMIN 500 MG 24 hr tablet Commonly known as:  GLUCOPHAGE-XR Take 1 tablet (500 mg total) by mouth daily with supper.   ONETOUCH VERIO test strip Generic drug:  glucose blood USE TO CHECK BLOOD SUGAR TWICE A DAY       Allergies: No Known Allergies  Past Medical History:  Diagnosis Date  . Allergy   . Diabetes mellitus   . Hypertension   . Insomnia     Past Surgical History:  Procedure Laterality Date  . COLONOSCOPY    . ELBOW SURGERY     rt elbow  . HERNIA REPAIR     left inguinal  . KNEE SURGERY     rt and left    Family History  Problem Relation Age of Onset  . Stroke Mother   . Hypertension Brother   . Colon cancer Neg Hx   . Diabetes Neg Hx     Social History:  reports that he has never smoked. He has never used smokeless tobacco. He reports that he does not drink alcohol or use drugs.   Review of Systems   Lipid history: On treatment long-term with Lipitor 10 mg daily from PCP    Lab Results  Component Value Date   CHOL 73 07/25/2017   HDL 33 (L) 07/25/2017   LDLCALC 22 07/25/2017   TRIG 97 07/25/2017   CHOLHDL 2.2 07/25/2017           Hypertension:Treated for >15 years and followed by PCP with good control   BP Readings from Last 3 Encounters:  01/09/18 130/80  09/08/17 140/82  07/28/17 120/80    Most recent eye exam was documented in 7/18  Most recent foot exam: 7/19 by PCP  LABS:  Lab on  01/07/2018  Component Date Value Ref Range Status  . Sodium 01/07/2018 139  135 - 145 mEq/L Final  . Potassium 01/07/2018 4.0  3.5 - 5.1 mEq/L Final  . Chloride 01/07/2018 102  96 - 112 mEq/L Final  . CO2 01/07/2018 30  19 - 32 mEq/L Final  . Glucose, Bld 01/07/2018 113* 70 - 99 mg/dL Final  . BUN 01/07/2018 11  6 - 23 mg/dL Final  . Creatinine, Ser 01/07/2018 1.06  0.40 - 1.50 mg/dL Final  . Total Bilirubin 01/07/2018 0.6  0.2 - 1.2 mg/dL Final  . Alkaline Phosphatase 01/07/2018 73  39 - 117 U/L Final  . AST 01/07/2018 17  0 - 37 U/L Final  . ALT 01/07/2018 18  0 - 53 U/L Final  . Total Protein 01/07/2018 7.0  6.0 - 8.3 g/dL Final  . Albumin 01/07/2018 4.2  3.5 - 5.2 g/dL Final  . Calcium 01/07/2018 9.3  8.4 - 10.5 mg/dL Final  . GFR 01/07/2018 89.84  >60.00 mL/min Final  . Hgb A1c MFr Bld 01/07/2018 7.1* 4.6 - 6.5 % Final   Glycemic Control Guidelines for People with Diabetes:Non Diabetic:  <6%Goal of Therapy: <7%Additional Action Suggested:  >8%     Physical Examination:  BP 130/80 (BP Location: Left Arm, Patient Position: Sitting, Cuff Size: Normal)   Pulse 69   Ht 5' 4"  (1.626 m)   Wt 191 lb (86.6 kg)   SpO2 98%   BMI 32.79 kg/m        ASSESSMENT:  Diabetes type 2 on oral agents  See history of present illness for detailed discussion of current diabetes management, blood sugar patterns and problems identified  He has an A1c of 7.1  Not clear why his A1c is higher than before He has excellent blood sugars throughout the day May have had high readings at times with not controlling snacks and may be other high carbohydrate or high fat items since he is checking his blood sugar only about once a day on an average May have had high readings with steroids last month also Also not taking metformin consistently in addition to his Janumet   HYPERTENSION: Blood pressure controlled consistently  PLAN:    He will take the additional metformin ER 500 mg daily at dinnertime  instead of lunchtime  He will try to increase his walking as tolerated  If he ever has steroids again he will need to make sure he watches his blood sugar carefully  Please schedule follow-up eye exam  He thinks he can cut back on snacks  We will check his A1c again in 3 months    There are no Patient Instructions on file for this visit.    Elayne Snare 01/09/2018, 8:26 AM   Note: This office note was prepared with Dragon voice recognition system technology. Any transcriptional errors that result from this process are unintentional.

## 2018-01-09 NOTE — Patient Instructions (Addendum)
Check blood sugars on waking up 2 days a week  Also check blood sugars about 2 hours after meals and do this after different meals by rotation  Recommended blood sugar levels on waking up are 90-130 and about 2 hours after meal is 130-160  Please bring your blood sugar monitor to each visit, thank you  Metformin at dinner

## 2018-02-03 ENCOUNTER — Other Ambulatory Visit: Payer: Federal, State, Local not specified - PPO | Admitting: Internal Medicine

## 2018-02-03 DIAGNOSIS — I10 Essential (primary) hypertension: Secondary | ICD-10-CM

## 2018-02-03 DIAGNOSIS — E785 Hyperlipidemia, unspecified: Secondary | ICD-10-CM

## 2018-02-03 DIAGNOSIS — E119 Type 2 diabetes mellitus without complications: Secondary | ICD-10-CM

## 2018-02-04 LAB — HEMOGLOBIN A1C
Hgb A1c MFr Bld: 6.8 % of total Hgb — ABNORMAL HIGH (ref <5.7)
Mean Plasma Glucose: 148 (calc)
eAG (mmol/L): 8.2 (calc)

## 2018-02-04 LAB — MICROALBUMIN / CREATININE URINE RATIO
CREATININE, URINE: 21 mg/dL (ref 20–320)
Microalb Creat Ratio: 24 mcg/mg creat (ref <30)
Microalb, Ur: 0.5 mg/dL

## 2018-02-04 LAB — HEPATIC FUNCTION PANEL
AG Ratio: 1.8 (calc) (ref 1.0–2.5)
ALT: 20 U/L (ref 9–46)
AST: 17 U/L (ref 10–35)
Albumin: 4.2 g/dL (ref 3.6–5.1)
Alkaline phosphatase (APISO): 64 U/L (ref 40–115)
BILIRUBIN TOTAL: 0.8 mg/dL (ref 0.2–1.2)
Bilirubin, Direct: 0.3 mg/dL — ABNORMAL HIGH (ref 0.0–0.2)
Globulin: 2.3 g/dL (calc) (ref 1.9–3.7)
Indirect Bilirubin: 0.5 mg/dL (calc) (ref 0.2–1.2)
Total Protein: 6.5 g/dL (ref 6.1–8.1)

## 2018-02-04 LAB — LIPID PANEL
Cholesterol: 76 mg/dL (ref <200)
HDL: 36 mg/dL — ABNORMAL LOW (ref >40)
LDL CHOLESTEROL (CALC): 26 mg/dL
Non-HDL Cholesterol (Calc): 40 mg/dL (calc) (ref <130)
Total CHOL/HDL Ratio: 2.1 (calc) (ref <5.0)
Triglycerides: 68 mg/dL (ref <150)

## 2018-02-05 ENCOUNTER — Encounter: Payer: Self-pay | Admitting: Internal Medicine

## 2018-02-05 ENCOUNTER — Other Ambulatory Visit: Payer: Self-pay | Admitting: Cardiology

## 2018-02-05 ENCOUNTER — Other Ambulatory Visit: Payer: Self-pay | Admitting: Internal Medicine

## 2018-02-05 ENCOUNTER — Ambulatory Visit (INDEPENDENT_AMBULATORY_CARE_PROVIDER_SITE_OTHER): Payer: Managed Care, Other (non HMO) | Admitting: Internal Medicine

## 2018-02-05 VITALS — BP 144/90 | HR 71 | Temp 98.2°F | Ht 64.0 in | Wt 193.0 lb

## 2018-02-05 DIAGNOSIS — E785 Hyperlipidemia, unspecified: Secondary | ICD-10-CM

## 2018-02-05 DIAGNOSIS — E786 Lipoprotein deficiency: Secondary | ICD-10-CM

## 2018-02-05 DIAGNOSIS — E7849 Other hyperlipidemia: Secondary | ICD-10-CM

## 2018-02-05 DIAGNOSIS — E8881 Metabolic syndrome: Secondary | ICD-10-CM

## 2018-02-05 DIAGNOSIS — I10 Essential (primary) hypertension: Secondary | ICD-10-CM | POA: Diagnosis not present

## 2018-02-05 DIAGNOSIS — E1169 Type 2 diabetes mellitus with other specified complication: Secondary | ICD-10-CM | POA: Diagnosis not present

## 2018-02-05 NOTE — Progress Notes (Signed)
   Subjective:    Patient ID: Mike Parker, male    DOB: Feb 04, 1950, 68 y.o.   MRN: [id number]  HPI 68 year old Male for 6 month recheck.Saw Dr. Lucianne Muss recently. Hgb AIC 7.1%. Lipid and liver panels are normal. Has had flu vaccine.Continues to try to watch diet. Not much exercise recently.  Blood pressure is elevated today.  He did not take his medication until shortly before he came to the office.    Review of Systems no new complaints     Objective:   Physical Exam BP 144/90 skin warm and dry.  Nodes none.  Neck is supple without JVD thyromegaly or carotid bruits.  Chest clear to auscultation.  Cardiac exam regular rate and rhythm normal S1 and S2.  Extremities without edema.       Assessment & Plan:  Essential hypertension  Diabetes mellitus type 2-  Hyperlipidemia  Metabolic syndrome  Obesity  Plan: He will need to return to the office for another blood pressure check and is advised to take blood pressure medication at least 2 hours prior to coming to the office.  Hemoglobin A1c is 6.8%.  Encourage diet exercise and weight loss.

## 2018-02-09 ENCOUNTER — Ambulatory Visit (INDEPENDENT_AMBULATORY_CARE_PROVIDER_SITE_OTHER): Payer: Managed Care, Other (non HMO) | Admitting: Internal Medicine

## 2018-02-09 ENCOUNTER — Encounter: Payer: Self-pay | Admitting: Internal Medicine

## 2018-02-09 VITALS — BP 140/80 | HR 68 | Ht 64.0 in | Wt 193.0 lb

## 2018-02-09 DIAGNOSIS — I10 Essential (primary) hypertension: Secondary | ICD-10-CM | POA: Diagnosis not present

## 2018-02-09 NOTE — Patient Instructions (Signed)
Continue same meds. Monitor BP at home and follow up in 6 months

## 2018-02-09 NOTE — Progress Notes (Signed)
  Subjective:     Patient ID: Mike Parker, male   DOB: 17-Sep-1950, 68 y.o.   MRN: [id number]  HPIHere for nurse visit. BP stable at 140/80 today. Had meds 2 hours before arrival   Review of Systems     Objective:   Physical Exam     Assessment:     Essential HTN    Plan:     Continue same meds and monitor BP at home. Was 144 /90 at time of CPE Jan 9

## 2018-02-25 NOTE — Patient Instructions (Addendum)
Take BP meds at least 2 hours before coming to office.  Return on January 13

## 2018-02-28 ENCOUNTER — Other Ambulatory Visit: Payer: Self-pay | Admitting: Endocrinology

## 2018-04-21 ENCOUNTER — Other Ambulatory Visit (INDEPENDENT_AMBULATORY_CARE_PROVIDER_SITE_OTHER): Payer: Managed Care, Other (non HMO)

## 2018-04-21 ENCOUNTER — Other Ambulatory Visit: Payer: Self-pay

## 2018-04-21 DIAGNOSIS — E1165 Type 2 diabetes mellitus with hyperglycemia: Secondary | ICD-10-CM

## 2018-04-21 LAB — BASIC METABOLIC PANEL
BUN: 13 mg/dL (ref 6–23)
CALCIUM: 9.3 mg/dL (ref 8.4–10.5)
CO2: 28 mEq/L (ref 19–32)
Chloride: 100 mEq/L (ref 96–112)
Creatinine, Ser: 1.02 mg/dL (ref 0.40–1.50)
GFR: 88.28 mL/min (ref >60.00)
Glucose, Bld: 106 mg/dL — ABNORMAL HIGH (ref 70–99)
Potassium: 3.4 mEq/L — ABNORMAL LOW (ref 3.5–5.1)
Sodium: 137 mEq/L (ref 135–145)

## 2018-04-21 LAB — HEMOGLOBIN A1C: Hgb A1c MFr Bld: 7.5 % — ABNORMAL HIGH (ref 4.6–6.5)

## 2018-04-22 ENCOUNTER — Ambulatory Visit (INDEPENDENT_AMBULATORY_CARE_PROVIDER_SITE_OTHER): Payer: Managed Care, Other (non HMO) | Admitting: Endocrinology

## 2018-04-22 ENCOUNTER — Encounter: Payer: Self-pay | Admitting: Endocrinology

## 2018-04-22 VITALS — BP 138/86 | HR 73 | Temp 99.0°F | Ht 64.0 in | Wt 194.0 lb

## 2018-04-22 DIAGNOSIS — I10 Essential (primary) hypertension: Secondary | ICD-10-CM | POA: Diagnosis not present

## 2018-04-22 DIAGNOSIS — E1165 Type 2 diabetes mellitus with hyperglycemia: Secondary | ICD-10-CM | POA: Diagnosis not present

## 2018-04-22 MED ORDER — CANAGLIFLOZIN-METFORMIN HCL ER 50-500 MG PO TB24: 2.0000 | ORAL_TABLET | Freq: Every day | ORAL | 2 refills | Status: DC

## 2018-04-22 NOTE — Patient Instructions (Addendum)
INVOKAMET: This replaces Janumet  For simplicity take 2 tablets together with breakfast Continue evening METFORMIN at bedtime  STOP GLIPIZIDE when starting Invokamet  With starting Invokamet reduce the FUROSEMIDE to half a tablet Make sure you are drinking plenty of fluids  If able to check the blood pressure do that since blood pressure may start getting lower and call if having any lightheadedness  Gradually increase walking

## 2018-04-22 NOTE — Progress Notes (Signed)
Patient ID: Mike Parker, male   DOB: [birth year], 68 y.o.   MRN: [id number]           Reason for Appointment: Follow-up for Type 2 Diabetes     History of Present Illness:          Date of diagnosis of type 2 diabetes mellitus: ?  2011        Background history:   He appears to have been on glipizide and Janumet for several years with fair control His A1c had been over 7% since 2015 He was referred here because of an A1c of 9.2% done in 6/17  Recent history:   His A1c had improved to 6.7, now gradually increasing up to 7.5   Non-insulin hypoglycemic drugs: Janumet 50/500 xr twice a day, glipizide ER 2.5 mg daily, metformin ER 500 mg PCS  Current management, blood sugar patterns and problems identified:   His fasting blood sugars are relatively high compared to last visit  He does not know why his blood sugars fluctuate but he seems to be overall higher than on the last visit  With taking glipizide he does tend to have low normal readings before dinnertime including some mildly symptomatic hypoglycemia  Also not losing weight currently  He says that he is recently starting to do a little walking  He is having a variable diet especially with taking care of his grandchildren recently  Also complaining about the cost of brand-name medications including Janumet        Side effects from medications have been: Diarrhea from high-dose metformin  Compliance with the medical regimen: Fair  Glucose monitoring:  done 1 + times a day         Glucometer: One Touch Blood Glucose readings by download:   PRE-MEAL Fasting Lunch Dinner Bedtime Overall  Glucose range:  100-188  91-179     Mean/median:  143  132   129   POST-MEAL PC Breakfast PC Lunch PC Dinner  Glucose range:    79-125   Mean/median:    105   Previous readings:  PRE-MEAL Fasting Lunch Dinner Bedtime Overall  Glucose range:  81-138   72, 80  84-159   Mean/median:  108  122   96 108     Self-care:  Meal  times are:  Breakfast is at 12 noon, dinner usually 7-8 PM  Typical meal intake: Breakfast is near noon egg or meat/ cereal.   Dinner is chicken or pork, greens, corn; snacks usually nuts, fruits            Dietician visit, most recent: 8/17               Exercise: walking a little more recently, limited by joint pain  Weight history:  Wt Readings from Last 3 Encounters:  04/22/18 194 lb (88 kg)  02/09/18 193 lb (87.5 kg)  02/05/18 193 lb (87.5 kg)    Glycemic control:   Lab Results  Component Value Date   HGBA1C 7.5 (H) 04/21/2018   HGBA1C 6.8 (H) 02/03/2018   HGBA1C 7.1 (H) 01/07/2018   Lab Results  Component Value Date   MICROALBUR 0.5 02/03/2018   LDLCALC 26 02/03/2018   CREATININE 1.02 04/21/2018   Lab Results  Component Value Date   MICRALBCREAT 24 02/03/2018    Other problems discussed: See review of systems    Allergies as of 04/22/2018   No Known Allergies     Medication List  Accurate as of April 22, 2018  9:46 AM. Always use your most recent med list.        amLODipine 10 MG tablet Commonly known as:  NORVASC TAKE 1 TABLET DAILY   aspirin 81 MG EC tablet Take 1 tablet (81 mg total) by mouth daily.   atorvastatin 10 MG tablet Commonly known as:  LIPITOR TAKE 1 TABLET AT SUPPER FORLIPID LOWERING   blood glucose meter kit and supplies Dispense based on patient and insurance preference. Test twice daily; dx code: B26.2   Bystolic 10 MG tablet Generic drug:  nebivolol TAKE 1 TABLET DAILY   Canagliflozin-metFORMIN HCl ER 50-500 MG Tb24 Commonly known as:  Invokamet XR Take 2 tablets by mouth daily with breakfast.   cloNIDine 0.1 MG tablet Commonly known as:  CATAPRES TAKE 1 TABLET TWICE A DAY   econazole nitrate 1 % cream Apply topically daily.   fluticasone 50 MCG/ACT nasal spray Commonly known as:  FLONASE USE TWO SPRAY(S) IN EACH NOSTRIL ONCE DAILY   freestyle lancets   furosemide 40 MG tablet Commonly known as:  LASIX  Take 1 tablet (40 mg total) by mouth daily.   glipiZIDE 2.5 MG 24 hr tablet Commonly known as:  GLUCOTROL XL TAKE 1 TABLET DAILY WITH BREAKFAST   Janumet XR 50-500 MG Tb24 Generic drug:  SitaGLIPtin-MetFORMIN HCl TAKE 2 TABLETS BY MOUTH ONCE DAILY   LORazepam 1 MG tablet Commonly known as:  ATIVAN Take 1 tablet (1 mg total) by mouth at bedtime as needed.   losartan 100 MG tablet Commonly known as:  COZAAR TAKE 1 TABLET DAILY   metFORMIN 500 MG 24 hr tablet Commonly known as:  GLUCOPHAGE-XR Take 1 tablet (500 mg total) by mouth daily with supper.   OneTouch Verio test strip Generic drug:  glucose blood USE TO CHECK BLOOD SUGAR TWICE A DAY       Allergies: No Known Allergies  Past Medical History:  Diagnosis Date  . Allergy   . Diabetes mellitus   . Hypertension   . Insomnia     Past Surgical History:  Procedure Laterality Date  . COLONOSCOPY    . ELBOW SURGERY     rt elbow  . HERNIA REPAIR     left inguinal  . KNEE SURGERY     rt and left    Family History  Problem Relation Age of Onset  . Stroke Mother   . Hypertension Brother   . Colon cancer Neg Hx   . Diabetes Neg Hx     Social History:  reports that he has never smoked. He has never used smokeless tobacco. He reports that he does not drink alcohol or use drugs.   Review of Systems   Lipid history: On treatment long-term with Lipitor 10 mg daily from PCP    Lab Results  Component Value Date   CHOL 76 02/03/2018   HDL 36 (L) 02/03/2018   LDLCALC 26 02/03/2018   TRIG 68 02/03/2018   CHOLHDL 2.1 02/03/2018           Hypertension:Treated for >15 years and followed by PCP with fair control He says that his blood pressure at home is not easy to operate for him   BP Readings from Last 3 Encounters:  04/22/18 138/86  02/09/18 140/80  02/05/18 (!) 144/90    Most recent eye exam was documented in 7/18  Most recent foot exam: 7/19 by PCP    LABS:  Lab on 04/21/2018  Component  Date Value Ref Range Status  . Sodium 04/21/2018 137  135 - 145 mEq/L Final  . Potassium 04/21/2018 3.4* 3.5 - 5.1 mEq/L Final  . Chloride 04/21/2018 100  96 - 112 mEq/L Final  . CO2 04/21/2018 28  19 - 32 mEq/L Final  . Glucose, Bld 04/21/2018 106* 70 - 99 mg/dL Final  . BUN 04/21/2018 13  6 - 23 mg/dL Final  . Creatinine, Ser 04/21/2018 1.02  0.40 - 1.50 mg/dL Final  . Calcium 04/21/2018 9.3  8.4 - 10.5 mg/dL Final  . GFR 04/21/2018 88.28  >60.00 mL/min Final  . Hgb A1c MFr Bld 04/21/2018 7.5* 4.6 - 6.5 % Final   Glycemic Control Guidelines for People with Diabetes:Non Diabetic:  <6%Goal of Therapy: <7%Additional Action Suggested:  >8%     Physical Examination:  BP 138/86 (BP Location: Left Arm, Patient Position: Sitting, Cuff Size: Normal)   Pulse 73   Temp 99 F (37.2 C) (Oral)   Ht _0  (1.626 m)   Wt 194 lb (88 kg)   SpO2 98%   BMI 33.30 kg/m    Repeat blood pressure 136/82 standing    ASSESSMENT:  Diabetes type 2 on oral agents  See history of present illness for detailed discussion of current diabetes management, blood sugar patterns and problems identified  He has an A1c of 7.5  Not clear why his A1c is higher than before Tends to have higher fasting readings and these are somewhat variable Although he has not gained any weight he may not be consistent with diet or exercise also He may be getting some progression of his diabetes also with using metformin, glipizide and Januvia  Currently not on a drug to reduce cardiovascular risk with his diabetes   HYPERTENSION: Blood pressure controlled fairly well although mildly increased  Hypokalemia: This is secondary to Lasix  PLAN:    He will take Invokamet instead of Janumet  Discussed action of SGLT 2 drugs on lowering glucose by decreasing kidney absorption of glucose, benefits of weight loss and lower blood pressure, possible side effects including candidiasis and dosage regimen   Also given him a 30-day  free trial coupon  As outlined in patient instructions able to stop glipizide and reduce his furosemide and losartan to half tablets with starting Invokamet and follow-up in 1 month  Encouraged him to increase exercise denies the need for weight loss    Patient Instructions  INVOKAMET: This replaces Janumet  For simplicity take 2 tablets together with breakfast Continue evening METFORMIN at bedtime  STOP GLIPIZIDE when starting Invokamet  With starting Invokamet reduce the FUROSEMIDE to half a tablet Make sure you are drinking plenty of fluids  If able to check the blood pressure do that since blood pressure may start getting lower and call if having any lightheadedness  Gradually increase walking     Elayne Snare 04/22/2018, 9:46 AM   Note: This office note was prepared with Dragon voice recognition system technology. Any transcriptional errors that result from this process are unintentional.

## 2018-04-23 ENCOUNTER — Ambulatory Visit: Payer: Medicare Other | Admitting: Endocrinology

## 2018-05-04 ENCOUNTER — Telehealth: Payer: Self-pay

## 2018-05-04 ENCOUNTER — Other Ambulatory Visit: Payer: Self-pay | Admitting: Endocrinology

## 2018-05-04 NOTE — Telephone Encounter (Signed)
He needs to call and see if Davonna Belling or Kirk Ruths is covered instead of Invokamet

## 2018-05-04 NOTE — Telephone Encounter (Signed)
Patient called today stating Invokamet is not covered by insurance and would like an alternative sent and a call back to let him know when this has been done- [phone]

## 2018-05-04 NOTE — Telephone Encounter (Signed)
Please advise 

## 2018-05-04 NOTE — Telephone Encounter (Signed)
Called pt and gave him MD message. PT verbalized understandnig.

## 2018-05-06 ENCOUNTER — Other Ambulatory Visit: Payer: Self-pay | Admitting: Endocrinology

## 2018-05-06 MED ORDER — EMPAGLIFLOZIN-METFORMIN HCL ER 10-1000 MG PO TB24: 1.0000 | ORAL_TABLET | Freq: Every day | ORAL | 2 refills | Status: DC

## 2018-05-06 NOTE — Telephone Encounter (Signed)
Which medication? And dose and sig?

## 2018-05-06 NOTE — Telephone Encounter (Signed)
Synjardy XR has been called in

## 2018-05-06 NOTE — Telephone Encounter (Signed)
Patient stated that insurance does cover [place] and [place]

## 2018-05-18 ENCOUNTER — Other Ambulatory Visit: Payer: Self-pay | Admitting: Internal Medicine

## 2018-05-18 ENCOUNTER — Telehealth: Payer: Self-pay | Admitting: Physician Assistant

## 2018-05-18 NOTE — Telephone Encounter (Signed)
Patient set up for MyChart?  Yes - consent sent through mychart as well as verbal consent   Is patient using Smartphone/computer/tablet?android  Did audio/video work? doximity  Does patient need telephone visit?no  Best phone number to use? [phone]  Special Instructions? Patient wil have bp , weight and medication list available     Virtual Visit Pre-Appointment Phone Call  Steps For Call:  1. Confirm consent - "In the setting of the current Covid19 crisis, you are scheduled for a (phone or video) visit with your provider on (date) at (time).  Just as we do with many in-office visits, in order for you to participate in this visit, we must obtain consent.  If you'd like, I can send this to your mychart (if signed up) or email for you to review.  Otherwise, I can obtain your verbal consent now.  All virtual visits are billed to your insurance company just like a normal visit would be.  By agreeing to a virtual visit, we'd like you to understand that the technology does not allow for your provider to perform an examination, and thus may limit your provider's ability to fully assess your condition. If your provider identifies any concerns that need to be evaluated in person, we will make arrangements to do so.  Finally, though the technology is pretty good, we cannot assure that it will always work on either your or our end, and in the setting of a video visit, we may have to convert it to a phone-only visit.  In either situation, we cannot ensure that we have a secure connection.  Are you willing to proceed?" STAFF: Did the patient verbally acknowledge consent to telehealth visit? Document YES/NO here: Yes   2. Confirm the BEST phone number to call the day of the visit by including in appointment notes  3. Give patient instructions for WebEx/MyChart download to smartphone as below or Doximity/Doxy.me if video visit (depending on what platform provider is using)  4. Advise patient to be  prepared with their blood pressure, heart rate, weight, any heart rhythm information, their current medicines, and a piece of paper and pen handy for any instructions they may receive the day of their visit  5. Inform patient they will receive a phone call 15 minutes prior to their appointment time (may be from unknown caller ID) so they should be prepared to answer  6. Confirm that appointment type is correct in Epic appointment notes (VIDEO vs PHONE)     TELEPHONE CALL NOTE  Mike Parker has been deemed a candidate for a follow-up tele-health visit to limit community exposure during the Covid-19 pandemic. I spoke with the patient via phone to ensure availability of phone/video source, confirm preferred email & phone number, and discuss instructions and expectations.  I reminded Mike Parker to be prepared with any vital sign and/or heart rhythm information that could potentially be obtained via home monitoring, at the time of his visit. I reminded Mike Parker to expect a phone call at the time of his visit if his visit.  [name] 05/18/2018 9:52 AM   INSTRUCTIONS FOR DOWNLOADING THE WEBEX APP TO SMARTPHONE  - If Apple, ask patient to go to App Store and type in WebEx in the search bar. Download [employer], the blue/green circle. If Android, go to [employer] and type in [employer] in the search bar. The app is free but as with any other app downloads, their phone may require  them to verify saved payment information or Apple/Android password.  - The patient does NOT have to create an account. - On the day of the visit, the assist will walk the patient through joining the meeting with the meeting number/password.  INSTRUCTIONS FOR DOWNLOADING THE MYCHART APP TO SMARTPHONE  - The patient must first make sure to have activated MyChart and know their login information - If Apple, go to [employer] Store and type in MyChart in the search bar and download the app. If Android, ask  patient to go to [employer] Play Store and type in [place] in the search bar and download the app. The app is free but as with any other app downloads, their phone may require them to verify saved payment information or Apple/Android password.  - The patient will need to then log into the app with their MyChart username and password, and select [place] as their healthcare provider to link the account. When it is time for your visit, go to the MyChart app, find appointments, and click Begin Video Visit. Be sure to Select Allow for your device to access the Microphone and Camera for your visit. You will then be connected, and your provider will be with you shortly.  **If they have any issues connecting, or need assistance please contact MyChart service desk [phone])**  **If using a computer, in order to ensure the best quality for their visit they will need to use either of the following Internet Browsers: [employer] Edge, or Google Chrome**  IF USING DOXIMITY or DOXY.ME - The patient will receive a link just prior to their visit, either by text or email (to be determined day of appointment depending on if it's doxy.me or Doximity).     FULL LENGTH CONSENT FOR TELE-HEALTH VISIT   I hereby voluntarily request, consent and authorize [facility] and its employed or contracted physicians, physician assistants, nurse practitioners or other licensed health care professionals (the Practitioner), to provide me with telemedicine health care services (the Services") as deemed necessary by the treating Practitioner. I acknowledge and consent to receive the Services by the Practitioner via telemedicine. I understand that the telemedicine visit will involve communicating with the Practitioner through live audiovisual communication technology and the disclosure of certain medical information by electronic transmission. I acknowledge that I have been given the opportunity to request an in-person  assessment or other available alternative prior to the telemedicine visit and am voluntarily participating in the telemedicine visit.  I understand that I have the right to withhold or withdraw my consent to the use of telemedicine in the course of my care at any time, without affecting my right to future care or treatment, and that the Practitioner or I may terminate the telemedicine visit at any time. I understand that I have the right to inspect all information obtained and/or recorded in the course of the telemedicine visit and may receive copies of available information for a reasonable fee.  I understand that some of the potential risks of receiving the Services via telemedicine include:   Delay or interruption in medical evaluation due to technological equipment failure or disruption;  Information transmitted may not be sufficient (e.g. poor resolution of images) to allow for appropriate medical decision making by the Practitioner; and/or   In rare instances, security protocols could fail, causing a breach of personal health information.  Furthermore, I acknowledge that it is my responsibility to provide information about my medical history, conditions and care that is complete and  accurate to the best of my ability. I acknowledge that Practitioner's advice, recommendations, and/or decision may be based on factors not within their control, such as incomplete or inaccurate data provided by me or distortions of diagnostic images or specimens that may result from electronic transmissions. I understand that the practice of medicine is not an exact science and that Practitioner makes no warranties or guarantees regarding treatment outcomes. I acknowledge that I will receive a copy of this consent concurrently upon execution via email to the email address I last provided but may also request a printed copy by calling the office of [facility].    I understand that my insurance will be billed for this  visit.   I have read or had this consent read to me.  I understand the contents of this consent, which adequately explains the benefits and risks of the Services being provided via telemedicine.   I have been provided ample opportunity to ask questions regarding this consent and the Services and have had my questions answered to my satisfaction.  I give my informed consent for the services to be provided through the use of telemedicine in my medical care  By participating in this telemedicine visit I agree to the above.

## 2018-05-21 ENCOUNTER — Telehealth (INDEPENDENT_AMBULATORY_CARE_PROVIDER_SITE_OTHER): Payer: Managed Care, Other (non HMO) | Admitting: Physician Assistant

## 2018-05-21 ENCOUNTER — Encounter: Payer: Self-pay | Admitting: Physician Assistant

## 2018-05-21 ENCOUNTER — Other Ambulatory Visit: Payer: Self-pay

## 2018-05-21 VITALS — BP 131/77 | HR 70 | Ht 64.0 in | Wt 191.0 lb

## 2018-05-21 DIAGNOSIS — I517 Cardiomegaly: Secondary | ICD-10-CM

## 2018-05-21 DIAGNOSIS — I10 Essential (primary) hypertension: Secondary | ICD-10-CM

## 2018-05-21 DIAGNOSIS — E785 Hyperlipidemia, unspecified: Secondary | ICD-10-CM

## 2018-05-21 NOTE — Progress Notes (Signed)
Virtual Visit via Video Note   This visit type was conducted due to national recommendations for restrictions regarding the COVID-19 Pandemic (e.g. social distancing) in an effort to limit this patient's exposure and mitigate transmission in our community.  Due to his co-morbid illnesses, this patient is at least at moderate risk for complications without adequate follow up.  This format is felt to be most appropriate for this patient at this time.  All issues noted in this document were discussed and addressed.  A limited physical exam was performed with this format.  Please refer to the patient's chart for his consent to telehealth for [facility].   Evaluation Performed:  Follow-up visit  Date:  05/21/2018   ID:  Mike, Parker 05-21-1950, MRN [id number]  Patient Location: Home Provider Location: Home  PCP:  Elby Showers, MD  Cardiologist:  Ena Dawley, MD  Electrophysiologist:  None   Chief Complaint:  1 year f/u cardiac risk factors.  History of Present Illness:    Mike Parker is a 68 y.o. male with HTN, DM, stroke 06/2012, colon CA, hyperlipidemia who presents via telehealth for routine cardiology f/u. He suffered a stroke in 2014 with subtle right arm parasthesias and has been managed with risk factor modification of his diabetes and HTN. Carotid duplex in 2014 was unremarkable. 2D Echo in 2014 showed moderate concentric LVH, EF 60-65%, mild LAE. Renal artery duplex 2015 was normal. Last labs 03/2018 showed K 3.4 (usually normal), Cr 1.02, glucose 106, LFTs, A1C 7.5, 01/2018 HDL 36, LDL 26, normals LFTs.   He reports he is feeling great. He continues to exercise regularly and denies any anginal symptoms. No CP, SOB, palpitations, dizziness, swelling or stroke symptoms. He is compliant with meds. He checks his BP regularly and tends to see in the 120s/70s. He states today's reading is "a little higher" than normal (but clearly still controlled). He has f/u with  endocrinology on 5/4. The patient does not have symptoms concerning for COVID-19 infection (fever, chills, cough, or new shortness of breath). He can't wait for the pandemic to be over because his grandchildren are all living with him and it's been a little chaotic.     Past Medical History:  Diagnosis Date  . Allergy   . Diabetes mellitus   . Hypertension   . Insomnia   . LVH (left ventricular hypertrophy)   . Stroke (cerebrum) ([place]) 2014   Past Surgical History:  Procedure Laterality Date  . COLONOSCOPY    . ELBOW SURGERY     rt elbow  . HERNIA REPAIR     left inguinal  . KNEE SURGERY     rt and left     Current Meds  Medication Sig  . amLODipine (NORVASC) 10 MG tablet TAKE 1 TABLET DAILY  . aspirin EC 81 MG EC tablet Take 1 tablet (81 mg total) by mouth daily.  Marland Kitchen atorvastatin (LIPITOR) 10 MG tablet TAKE 1 TABLET AT SUPPER FORLIPID LOWERING  . blood glucose meter kit and supplies Dispense based on patient and insurance preference. Test twice daily; dx code: G64.4  . BYSTOLIC 10 MG tablet TAKE 1 TABLET DAILY  . cloNIDine (CATAPRES) 0.1 MG tablet TAKE 1 TABLET TWICE A DAY  . econazole nitrate 1 % cream Apply topically daily.  . Empagliflozin-metFORMIN HCl ER (SYNJARDY XR) 10-998 MG TB24 Take 1 tablet by mouth daily with breakfast.  . fluticasone (FLONASE) 50 MCG/ACT nasal spray USE TWO SPRAY(S) IN EACH NOSTRIL ONCE  DAILY  . furosemide (LASIX) 40 MG tablet TAKE 1 TABLET DAILY  . glipiZIDE (GLUCOTROL XL) 2.5 MG 24 hr tablet TAKE 1 TABLET DAILY WITH BREAKFAST  . Lancets (FREESTYLE) lancets   . losartan (COZAAR) 100 MG tablet TAKE 1 TABLET DAILY  . metFORMIN (GLUCOPHAGE-XR) 500 MG 24 hr tablet Take 1 tablet (500 mg total) by mouth daily with supper.  Glory Rosebush VERIO test strip USE TO CHECK BLOOD SUGAR TWICE A DAY     Allergies:   Patient has no known allergies.   Social History   Tobacco Use  . Smoking status: Never Smoker  . Smokeless tobacco: Never Used  Substance  Use Topics  . Alcohol use: No  . Drug use: No     Family Hx: The patient's family history includes Hypertension in his brother; Stroke in his mother. There is no history of Colon cancer or Diabetes.  ROS:   Please see the history of present illness.    All other systems reviewed and are negative.   Prior CV studies:   Outlined above  Labs/Other Tests and Data Reviewed:    EKG: 07/29/2017 NSR no acute STT changes  Recent Labs: 07/25/2017: Hemoglobin 13.9; Platelets 224 02/03/2018: ALT 20 04/21/2018: BUN 13; Creatinine, Ser 1.02; Potassium 3.4; Sodium 137   Recent Lipid Panel Lab Results  Component Value Date/Time   CHOL 76 02/03/2018 09:13 AM   TRIG 68 02/03/2018 09:13 AM   HDL 36 (L) 02/03/2018 09:13 AM   CHOLHDL 2.1 02/03/2018 09:13 AM   LDLCALC 26 02/03/2018 09:13 AM    Wt Readings from Last 3 Encounters:  05/21/18 191 lb (86.6 kg)  04/22/18 194 lb (88 kg)  02/09/18 193 lb (87.5 kg)     Objective:    Vital Signs:  BP 131/77   Pulse 70   Ht 5' 4"  (1.626 m)   Wt 191 lb (86.6 kg)   BMI 32.79 kg/m    General - AAM in no acute distress HEENT - normocephalic, atraumatic, EOM intact Pulm - No labored breathing, no coughing during visit, no audible wheezing, speaking in full sentences MSK - moves all extremities spontaneously Neuro - A+Ox3, no slurred speech, answers questions appropriately Psych - Pleasant affect    ASSESSMENT & PLAN:    1. Essential HTN - well controlled by patient report. He denies any problems with his current regimen. Will continue. Of note, he had mild hypokalemia on labs 03/2018 by endocrinology. He admits he wasn't feeling his best that day so it's unclear if that was playing a role in his K level which is traditionally normal. I encouraged him to increase intake of potassium rich foods (making sure to avoid higher sugar fruits given his DM or pair with foods lower on glycemic index). He sees his endocrinologist on 5/4 at which time repeat  bloodwork is planned. I see orders for a BMET and A1C in. I asked him to review with his endocrinologist whether he requires any potassium supplementation based on that result. He is on Lasix chronically. 2. Left ventricular hypertrophy - discussed with patient. Continue BP management. No symptoms of CHF reported. 3. Hyperlipidemia - this is followed and managed by primary care. Last LDL 01/2018 was 26.  COVID-19 Education: The signs and symptoms of COVID-19 were discussed with the patient and how to seek care for testing (follow up with PCP or arrange E-visit).  The importance of social distancing was discussed today.  Time:   Today, I have spent 15 minutes  with the patient with telehealth technology discussing the above problems.     Medication Adjustments/Labs and Tests Ordered: Current medicines are reviewed at length with the patient today.  Concerns regarding medicines are outlined above.    Disposition:  Follow up 1 year with Dr. Meda Coffee    Signed, Charlie Pitter, PA-C  05/21/2018 2:30 PM    [place]

## 2018-05-21 NOTE — Patient Instructions (Addendum)
Medication Instructions:  Your physician recommends that you continue on your current medications as directed. Please refer to the Current Medication list given to you today.  If you need a refill on your cardiac medications before your next appointment, please call your pharmacy.   Lab work: None ordered  If you have labs (blood work) drawn today and your tests are completely normal, you will receive your results only by: Marland Kitchen MyChart Message (if you have MyChart) OR . A paper copy in the mail If you have any lab test that is abnormal or we need to change your treatment, we will call you to review the results.  Testing/Procedures: None ordered  Follow-Up: At [facility], you and your health needs are our priority.  As part of our continuing mission to provide you with exceptional heart care, we have created designated Provider Care Teams.  These Care Teams include your primary Cardiologist (physician) and Advanced Practice Providers (APPs -  Physician Assistants and Nurse Practitioners) who all work together to provide you with the care you need, when you need it. You will need a follow up appointment in 12 months.  Please call our office 2 months in advance to schedule this appointment.  You may see Tobias Alexander, MD or one of the following Advanced Practice Providers on your designated Care Team:   [place], PA-C Ronie Spies, PA-C . Jacolyn Reedy, PA-C  Any Other Special Instructions Will Be Listed Below (If Applicable). Please increase dietary intake of healthy sources of potassium including bananas, squash, yogurt, white beans, leafy greens, sweet potatoes, and avocados (as long as you are not allergic to any of these foods).   It looks like your endocrinologist has repeat labwork planned for you when you see him - make sure to discuss the recent low potassium level to ensure this is drawn as well. It appears he has ordered a BMET which will include a potassium level.

## 2018-05-26 ENCOUNTER — Telehealth: Payer: Self-pay | Admitting: Internal Medicine

## 2018-05-26 NOTE — Telephone Encounter (Signed)
Sent e-mail to [facility] and schedule CPE, AWV and labs after 07/29/2018

## 2018-05-29 ENCOUNTER — Telehealth: Payer: Self-pay | Admitting: Internal Medicine

## 2018-05-29 ENCOUNTER — Other Ambulatory Visit: Payer: Self-pay

## 2018-05-29 MED ORDER — NEBIVOLOL HCL 10 MG PO TABS: 10.0000 mg | ORAL_TABLET | Freq: Every day | ORAL | 0 refills | Status: DC

## 2018-05-29 NOTE — Telephone Encounter (Signed)
Called patient to schedule CPE for 2020, and he didn't pick up. Phone not setup to receive voicemails.

## 2018-05-29 NOTE — Telephone Encounter (Signed)
Received Fax RX request from  Pharmacy - [facility]  Medication -  BYSTOLIC 10 MG tablet  Last Refill -   Last OV - 1.13.20  Last CPE - 7.1.19

## 2018-06-01 ENCOUNTER — Other Ambulatory Visit: Payer: Self-pay

## 2018-06-01 ENCOUNTER — Other Ambulatory Visit (INDEPENDENT_AMBULATORY_CARE_PROVIDER_SITE_OTHER): Payer: Managed Care, Other (non HMO)

## 2018-06-01 DIAGNOSIS — E1165 Type 2 diabetes mellitus with hyperglycemia: Secondary | ICD-10-CM

## 2018-06-01 LAB — BASIC METABOLIC PANEL
BUN: 15 mg/dL (ref 6–23)
CO2: 28 mEq/L (ref 19–32)
Calcium: 9.4 mg/dL (ref 8.4–10.5)
Chloride: 103 mEq/L (ref 96–112)
Creatinine, Ser: 1.05 mg/dL (ref 0.40–1.50)
GFR: 85.35 mL/min (ref >60.00)
Glucose, Bld: 112 mg/dL — ABNORMAL HIGH (ref 70–99)
Potassium: 4 mEq/L (ref 3.5–5.1)
Sodium: 140 mEq/L (ref 135–145)

## 2018-06-02 LAB — FRUCTOSAMINE: Fructosamine: 249 umol/L (ref 0–285)

## 2018-06-03 ENCOUNTER — Encounter: Payer: Self-pay | Admitting: Endocrinology

## 2018-06-03 ENCOUNTER — Other Ambulatory Visit: Payer: Self-pay

## 2018-06-03 ENCOUNTER — Ambulatory Visit (INDEPENDENT_AMBULATORY_CARE_PROVIDER_SITE_OTHER): Payer: Managed Care, Other (non HMO) | Admitting: Endocrinology

## 2018-06-03 VITALS — BP 122/70 | HR 71 | Ht 64.0 in | Wt 191.2 lb

## 2018-06-03 DIAGNOSIS — E669 Obesity, unspecified: Secondary | ICD-10-CM

## 2018-06-03 DIAGNOSIS — E1169 Type 2 diabetes mellitus with other specified complication: Secondary | ICD-10-CM

## 2018-06-03 MED ORDER — EMPAGLIFLOZIN-METFORMIN HCL ER 10-1000 MG PO TB24: 1.0000 | ORAL_TABLET | Freq: Every day | ORAL | 2 refills | Status: DC

## 2018-06-03 NOTE — Patient Instructions (Signed)
Take 1/2 Lasix daily  Stop Glipizide  Check blood sugars on waking up 2 days a week  Also check blood sugars about 2 hours after meals and do this after different meals by rotation  Recommended blood sugar levels on waking up are 90-130 and about 2 hours after meal is 130-160  Please bring your blood sugar monitor to each visit, thank you

## 2018-06-03 NOTE — Progress Notes (Signed)
Patient ID: Mike Parker, male   DOB: May 14, 1950, 68 y.o.   MRN: [id number]           Reason for Appointment: Follow-up for Type 2 Diabetes     History of Present Illness:          Date of diagnosis of type 2 diabetes mellitus: ?  2011        Background history:   He appears to have been on glipizide and Janumet for several years with fair control His A1c had been over 7% since 2015 He was referred here because of an A1c of 9.2% done in 6/17  Recent history:   His A1c was last 7.5, previously 6.8  Fructosamine is 229  Non-insulin hypoglycemic drugs: Synjardy 10/998, glipizide 2.5 mg, metformin ER 500 mg PCS  Current management, blood sugar patterns and problems identified:   His Janumet was changed to [place] on his last visit because of higher blood sugars especially fasting  He was told to stop his glipizide at the same time but he forgot and he still taking this  Blood sugars overall are significantly better  Also has had a low blood sugar of 65 on Sunday evening recently although he did not have any symptoms  Currently thinks he can afford Synjardy although he wants a 90-day supply  He checks his blood sugars at different times although recently mostly midday or afternoon  Although he was having relatively higher fasting readings last month his lab glucose was only 112 fasting  Overall his diet may be also a little better than before  No hypoglycemic symptoms overnight        Side effects from medications have been: Diarrhea from high-dose metformin  Compliance with the medical regimen: Fair  Glucose monitoring:  done 1 + times a day         Glucometer: One Touch Blood Glucose readings by download:   PRE-MEAL Fasting Lunch Dinner Bedtime Overall  Glucose range:  105-138  109-140  80-207  65, 112   Mean/median:  133  129  98   115   POST-MEAL PC Breakfast PC Lunch PC Dinner  Glucose range:     Mean/median:  112  94     Previous readings    PRE-MEAL Fasting Lunch Dinner Bedtime Overall  Glucose range:  100-188  91-179     Mean/median:  143  132   129   POST-MEAL PC Breakfast PC Lunch PC Dinner  Glucose range:    79-125   Mean/median:    105   Self-care:  Meal times are:  Breakfast is at 12 noon, dinner usually 7-8 PM  Typical meal intake: Breakfast is near noon egg or meat/ cereal.   Dinner is chicken or pork, greens, corn; snacks usually nuts, fruits            Dietician visit, most recent: 8/17               Exercise: walking, limited by joint pain  Weight history:  Wt Readings from Last 3 Encounters:  06/03/18 191 lb 3.2 oz (86.7 kg)  05/21/18 191 lb (86.6 kg)  04/22/18 194 lb (88 kg)    Glycemic control:   Lab Results  Component Value Date   HGBA1C 7.5 (H) 04/21/2018   HGBA1C 6.8 (H) 02/03/2018   HGBA1C 7.1 (H) 01/07/2018   Lab Results  Component Value Date   MICROALBUR 0.5 02/03/2018   LDLCALC 26 02/03/2018   CREATININE 1.05 06/01/2018  Lab Results  Component Value Date   MICRALBCREAT 24 02/03/2018    Other problems discussed: See review of systems    Allergies as of 06/03/2018   No Known Allergies     Medication List       Accurate as of Jun 03, 2018  1:18 PM. Always use your most recent med list.        amLODipine 10 MG tablet Commonly known as:  NORVASC TAKE 1 TABLET DAILY   aspirin 81 MG EC tablet Take 1 tablet (81 mg total) by mouth daily.   atorvastatin 10 MG tablet Commonly known as:  LIPITOR TAKE 1 TABLET AT SUPPER FORLIPID LOWERING   blood glucose meter kit and supplies Dispense based on patient and insurance preference. Test twice daily; dx code: E11.9   cloNIDine 0.1 MG tablet Commonly known as:  CATAPRES TAKE 1 TABLET TWICE A DAY   econazole nitrate 1 % cream Apply topically daily.   Empagliflozin-metFORMIN HCl ER 10-998 MG Tb24 Commonly known as:  Synjardy XR Take 1 tablet by mouth daily with breakfast.   fluticasone 50 MCG/ACT nasal spray Commonly  known as:  FLONASE USE TWO SPRAY(S) IN EACH NOSTRIL ONCE DAILY   freestyle lancets   furosemide 40 MG tablet Commonly known as:  LASIX TAKE 1 TABLET DAILY   glipiZIDE 2.5 MG 24 hr tablet Commonly known as:  GLUCOTROL XL TAKE 1 TABLET DAILY WITH BREAKFAST   losartan 100 MG tablet Commonly known as:  COZAAR TAKE 1 TABLET DAILY   metFORMIN 500 MG 24 hr tablet Commonly known as:  GLUCOPHAGE-XR Take 1 tablet (500 mg total) by mouth daily with supper.   nebivolol 10 MG tablet Commonly known as:  Bystolic Take 1 tablet (10 mg total) by mouth daily.   OneTouch Verio test strip Generic drug:  glucose blood USE TO CHECK BLOOD SUGAR TWICE A DAY       Allergies: No Known Allergies  Past Medical History:  Diagnosis Date  . Allergy   . Diabetes mellitus   . Hypertension   . Insomnia   . LVH (left ventricular hypertrophy)   . Stroke (cerebrum) ([place]) 2014    Past Surgical History:  Procedure Laterality Date  . COLONOSCOPY    . ELBOW SURGERY     rt elbow  . HERNIA REPAIR     left inguinal  . KNEE SURGERY     rt and left    Family History  Problem Relation Age of Onset  . Stroke Mother   . Hypertension Brother   . Colon cancer Neg Hx   . Diabetes Neg Hx     Social History:  reports that he has never smoked. He has never used smokeless tobacco. He reports that he does not drink alcohol or use drugs.   Review of Systems   Lipid history: On treatment long-term with Lipitor 10 mg daily from PCP    Lab Results  Component Value Date   CHOL 76 02/03/2018   HDL 36 (L) 02/03/2018   LDLCALC 26 02/03/2018   TRIG 68 02/03/2018   CHOLHDL 2.1 02/03/2018           Hypertension:Treated for >15 years and followed by PCP With starting Iva Boop he was told to reduce his losartan in half and also the Lasix but he forgot to do this and is still taking the full amounts No lightheadedness but his blood pressure appears to be improved   BP Readings from Last 3 Encounters:  06/03/18 122/70  05/21/18 131/77  04/22/18 138/86    Most recent eye exam was documented in 7/18  Most recent foot exam: 7/19 by PCP    LABS:  Lab on 06/01/2018  Component Date Value Ref Range Status  . Sodium 06/01/2018 140  135 - 145 mEq/L Final  . Potassium 06/01/2018 4.0  3.5 - 5.1 mEq/L Final  . Chloride 06/01/2018 103  96 - 112 mEq/L Final  . CO2 06/01/2018 28  19 - 32 mEq/L Final  . Glucose, Bld 06/01/2018 112* 70 - 99 mg/dL Final  . BUN 06/01/2018 15  6 - 23 mg/dL Final  . Creatinine, Ser 06/01/2018 1.05  0.40 - 1.50 mg/dL Final  . Calcium 06/01/2018 9.4  8.4 - 10.5 mg/dL Final  . GFR 06/01/2018 85.35  >60.00 mL/min Final  . Fructosamine 06/01/2018 249  0 - 285 umol/L Final   Comment: Published reference interval for apparently healthy subjects between age 90 and 37 is 4 - 285 umol/L and in a poorly controlled diabetic population is 228 - 563 umol/L with a mean of 396 umol/L.     Physical Examination:  BP 122/70 (BP Location: Left Arm, Patient Position: Sitting, Cuff Size: Normal)   Pulse 71   Ht _0  (1.626 m)   Wt 191 lb 3.2 oz (86.7 kg)   SpO2 98%   BMI 32.82 kg/m        ASSESSMENT:  Diabetes type 2 on oral agents  See history of present illness for detailed discussion of current diabetes management, blood sugar patterns and problems identified  He has an A1c of 7.5 previously but now his fructosamine is excellent at 249  His blood sugars are much better controlled with using Synjardy instead of Janumet Also taking additional 500 mg metformin since he cannot likely tolerate 2000 mg metformin a day Blood sugars excellent at home He forgot to stop his glipizide and has a blood sugar as low as 65 Also is starting to lose a little weight with using Jardiance No change in renal function with adding Jardiance  He is able to afford the [place] and discussed that he should have reduced cardiovascular risk with this also   HYPERTENSION: Blood  pressure controlled better now with adding Jardiance  Hypokalemia: Resolved, not clear if has changed because of adding Jardiance  PLAN:  No change in medications but he needs to not take glipizide since his blood sugars are better with Synjardy and he has had a sugar as low as 65 Also reduce Lasix to half a tablet since this is not for heart failure Regular walking as much as tolerated He needs to rotate when he checks the blood sugar and discussed blood sugar targets both fasting and after meals A1c on the next visit   Patient Instructions  Take 1/2 Lasix daily  Stop Glipizide  Check blood sugars on waking up 2 days a week  Also check blood sugars about 2 hours after meals and do this after different meals by rotation  Recommended blood sugar levels on waking up are 90-130 and about 2 hours after meal is 130-160  Please bring your blood sugar monitor to each visit, thank you       Elayne Snare 06/03/2018, 1:18 PM   Note: This office note was prepared with Dragon voice recognition system technology. Any transcriptional errors that result from this process are unintentional.

## 2018-06-09 ENCOUNTER — Ambulatory Visit: Payer: Medicare Other | Admitting: Cardiology

## 2018-06-09 NOTE — Telephone Encounter (Signed)
Appointment scheduled.

## 2018-06-16 ENCOUNTER — Other Ambulatory Visit: Payer: Self-pay

## 2018-06-16 ENCOUNTER — Other Ambulatory Visit: Payer: Self-pay | Admitting: Cardiology

## 2018-06-16 MED ORDER — NEBIVOLOL HCL 10 MG PO TABS: 10.0000 mg | ORAL_TABLET | Freq: Every day | ORAL | 0 refills | Status: DC

## 2018-07-23 DIAGNOSIS — Z20828 Contact with and (suspected) exposure to other viral communicable diseases: Secondary | ICD-10-CM | POA: Diagnosis not present

## 2018-08-22 ENCOUNTER — Other Ambulatory Visit: Payer: Self-pay | Admitting: Endocrinology

## 2018-08-27 ENCOUNTER — Other Ambulatory Visit: Payer: Self-pay | Admitting: Internal Medicine

## 2018-09-03 ENCOUNTER — Ambulatory Visit: Payer: Federal, State, Local not specified - PPO | Admitting: Endocrinology

## 2018-09-08 ENCOUNTER — Other Ambulatory Visit (INDEPENDENT_AMBULATORY_CARE_PROVIDER_SITE_OTHER): Payer: Managed Care, Other (non HMO)

## 2018-09-08 ENCOUNTER — Other Ambulatory Visit: Payer: Self-pay

## 2018-09-08 DIAGNOSIS — E1169 Type 2 diabetes mellitus with other specified complication: Secondary | ICD-10-CM | POA: Diagnosis not present

## 2018-09-08 DIAGNOSIS — E669 Obesity, unspecified: Secondary | ICD-10-CM | POA: Diagnosis not present

## 2018-09-08 LAB — COMPREHENSIVE METABOLIC PANEL
ALT: 21 U/L (ref 0–53)
AST: 19 U/L (ref 0–37)
Albumin: 4.5 g/dL (ref 3.5–5.2)
Alkaline Phosphatase: 64 U/L (ref 39–117)
BUN: 18 mg/dL (ref 6–23)
CO2: 29 mEq/L (ref 19–32)
Calcium: 9.8 mg/dL (ref 8.4–10.5)
Chloride: 100 mEq/L (ref 96–112)
Creatinine, Ser: 1.07 mg/dL (ref 0.40–1.50)
GFR: 83.44 mL/min (ref >60.00)
Glucose, Bld: 108 mg/dL — ABNORMAL HIGH (ref 70–99)
Potassium: 3.5 mEq/L (ref 3.5–5.1)
Sodium: 137 mEq/L (ref 135–145)
Total Bilirubin: 0.8 mg/dL (ref 0.2–1.2)
Total Protein: 7.1 g/dL (ref 6.0–8.3)

## 2018-09-08 LAB — HEMOGLOBIN A1C: Hgb A1c MFr Bld: 6.9 % — ABNORMAL HIGH (ref 4.6–6.5)

## 2018-09-10 ENCOUNTER — Encounter: Payer: Self-pay | Admitting: Endocrinology

## 2018-09-10 ENCOUNTER — Ambulatory Visit (INDEPENDENT_AMBULATORY_CARE_PROVIDER_SITE_OTHER): Payer: Managed Care, Other (non HMO) | Admitting: Endocrinology

## 2018-09-10 ENCOUNTER — Other Ambulatory Visit: Payer: Self-pay

## 2018-09-10 VITALS — BP 140/70 | HR 71 | Ht 64.0 in | Wt 187.2 lb

## 2018-09-10 DIAGNOSIS — E1169 Type 2 diabetes mellitus with other specified complication: Secondary | ICD-10-CM

## 2018-09-10 DIAGNOSIS — E669 Obesity, unspecified: Secondary | ICD-10-CM | POA: Diagnosis not present

## 2018-09-10 MED ORDER — ONETOUCH VERIO VI STRP: ORAL_STRIP | 2 refills | Status: DC

## 2018-09-10 NOTE — Progress Notes (Signed)
Patient ID: Mike Parker, male   DOB: 1950-06-14, 68 y.o.   MRN: [id number]           Reason for Appointment: Follow-up for Type 2 Diabetes     History of Present Illness:          Date of diagnosis of type 2 diabetes mellitus: ?  2011        Background history:   He appears to have been on glipizide and Janumet for several years with fair control His A1c had been over 7% since 2015 He was referred here because of an A1c of 9.2% done in 6/17  Recent history:   His A1c was last 7.5, improved at 6.9  Fructosamine previously 229  Non-insulin hypoglycemic drugs: Synjardy 10/998,  metformin ER 500 mg PCS  Current management, blood sugar patterns and problems identified:   His glipizide was supposed to be stopped but he continued taking this and only ran out about 10 days ago  However he is checking his blood sugars now at insurance provided meter and not clear how accurate it is, his lowest reading has been 68 before dinner  Previously had some variability in blood sugars as high as 207 but now appears to have average readings about 132 after meals  He has lost 4 pounds  No change in renal function with continuing Synjardy  He had run out of metformin for about a month because of the recall and he has started back taking it is now, however he does not think his blood sugars were any different without it  Usually trying to eat balanced meals  Is trying to do a little more walking these days         Side effects from medications have been: Diarrhea from high-dose metformin  Compliance with the medical regimen: Fair  Glucose monitoring:  done 1 + times a day         Glucometer: One Touch Blood Glucose readings by download:   PRE-MEAL  before meals Lunch  after meals Bedtime Overall  Glucose range:  68-139   111-177    Mean/median:     116   Previous readings:  PRE-MEAL Fasting Lunch Dinner Bedtime Overall  Glucose range:  105-138  109-140  80-207  65, 112    Mean/median:  133  129  98   115   POST-MEAL PC Breakfast PC Lunch PC Dinner  Glucose range:     Mean/median:  112  94     Self-care:  Meal times are:  Breakfast is at 12 noon, dinner usually 7-8 PM  Typical meal intake: Breakfast is near noon egg or meat/ cereal.   Dinner is chicken or pork, greens, corn; snacks usually nuts, fruits            Dietician visit, most recent: 8/17                Weight history:  Wt Readings from Last 3 Encounters:  09/10/18 187 lb 3.2 oz (84.9 kg)  06/03/18 191 lb 3.2 oz (86.7 kg)  05/21/18 191 lb (86.6 kg)    Glycemic control:   Lab Results  Component Value Date   HGBA1C 6.9 (H) 09/08/2018   HGBA1C 7.5 (H) 04/21/2018   HGBA1C 6.8 (H) 02/03/2018   Lab Results  Component Value Date   MICROALBUR 0.5 02/03/2018   LDLCALC 26 02/03/2018   CREATININE 1.07 09/08/2018   Lab Results  Component Value Date   MICRALBCREAT 24 02/03/2018  Other problems discussed: See review of systems    Allergies as of 09/10/2018   No Known Allergies     Medication List       Accurate as of September 10, 2018 10:23 AM. If you have any questions, ask your nurse or doctor.        STOP taking these medications   glipiZIDE 2.5 MG 24 hr tablet Commonly known as: GLUCOTROL XL Stopped by: Elayne Snare, MD     TAKE these medications   amLODipine 10 MG tablet Commonly known as: NORVASC TAKE 1 TABLET DAILY   aspirin 81 MG EC tablet Take 1 tablet (81 mg total) by mouth daily.   atorvastatin 10 MG tablet Commonly known as: LIPITOR TAKE 1 TABLET AT SUPPER FORLIPID LOWERING   blood glucose meter kit and supplies Dispense based on patient and insurance preference. Test twice daily; dx code: C16.6   Bystolic 10 MG tablet Generic drug: nebivolol TAKE 1 TABLET DAILY   cloNIDine 0.1 MG tablet Commonly known as: CATAPRES TAKE 1 TABLET TWICE A DAY   econazole nitrate 1 % cream Apply topically daily.   Empagliflozin-metFORMIN HCl ER 10-998 MG Tb24  Commonly known as: Synjardy XR Take 1 tablet by mouth daily with breakfast.   fluticasone 50 MCG/ACT nasal spray Commonly known as: FLONASE USE TWO SPRAY(S) IN EACH NOSTRIL ONCE DAILY   freestyle lancets   furosemide 40 MG tablet Commonly known as: LASIX TAKE 1 TABLET DAILY   losartan 100 MG tablet Commonly known as: COZAAR TAKE 1 TABLET DAILY   metFORMIN 500 MG 24 hr tablet Commonly known as: GLUCOPHAGE-XR TAKE 1 TABLET BY MOUTH ONCE DAILY WITH SUPPER   OneTouch Verio test strip Generic drug: glucose blood USE TO CHECK BLOOD SUGAR TWICE A DAY       Allergies: No Known Allergies  Past Medical History:  Diagnosis Date  . Allergy   . Diabetes mellitus   . Hypertension   . Insomnia   . LVH (left ventricular hypertrophy)   . Stroke (cerebrum) ([place]) 2014    Past Surgical History:  Procedure Laterality Date  . COLONOSCOPY    . ELBOW SURGERY     rt elbow  . HERNIA REPAIR     left inguinal  . KNEE SURGERY     rt and left    Family History  Problem Relation Age of Onset  . Stroke Mother   . Hypertension Brother   . Colon cancer Neg Hx   . Diabetes Neg Hx     Social History:  reports that he has never smoked. He has never used smokeless tobacco. He reports that he does not drink alcohol or use drugs.   Review of Systems   Lipid history: On treatment long-term with Lipitor 10 mg daily from PCP    Lab Results  Component Value Date   CHOL 76 02/03/2018   HDL 36 (L) 02/03/2018   LDLCALC 26 02/03/2018   TRIG 68 02/03/2018   CHOLHDL 2.1 02/03/2018           Hypertension:Treated for >15 years and followed by PCP With starting Synjardy he was supposed to cut his losartan in half but he somehow is taking 1 whole tablet on his own  He will occasionally check his blood pressure and readings are as follows: Bp 127-134/75-79   BP Readings from Last 3 Encounters:  09/10/18 140/70  06/03/18 122/70  05/21/18 131/77   No problems with edema: He was told  to cut his  Lasix in half with starting Synjardy  Most recent eye exam was documented in 7/18 and he has not made a follow-up  Most recent foot exam: 7/19 by PCP    LABS:  Lab on 09/08/2018  Component Date Value Ref Range Status  . Sodium 09/08/2018 137  135 - 145 mEq/L Final  . Potassium 09/08/2018 3.5  3.5 - 5.1 mEq/L Final  . Chloride 09/08/2018 100  96 - 112 mEq/L Final  . CO2 09/08/2018 29  19 - 32 mEq/L Final  . Glucose, Bld 09/08/2018 108* 70 - 99 mg/dL Final  . BUN 09/08/2018 18  6 - 23 mg/dL Final  . Creatinine, Ser 09/08/2018 1.07  0.40 - 1.50 mg/dL Final  . Total Bilirubin 09/08/2018 0.8  0.2 - 1.2 mg/dL Final  . Alkaline Phosphatase 09/08/2018 64  39 - 117 U/L Final  . AST 09/08/2018 19  0 - 37 U/L Final  . ALT 09/08/2018 21  0 - 53 U/L Final  . Total Protein 09/08/2018 7.1  6.0 - 8.3 g/dL Final  . Albumin 09/08/2018 4.5  3.5 - 5.2 g/dL Final  . Calcium 09/08/2018 9.8  8.4 - 10.5 mg/dL Final  . GFR 09/08/2018 83.44  >60.00 mL/min Final  . Hgb A1c MFr Bld 09/08/2018 6.9* 4.6 - 6.5 % Final   Glycemic Control Guidelines for People with Diabetes:Non Diabetic:  <6%Goal of Therapy: <7%Additional Action Suggested:  >8%     Physical Examination:  BP 140/70 (BP Location: Left Arm, Patient Position: Sitting, Cuff Size: Normal)   Pulse 71   Ht 5' 4" (1.626 m)   Wt 187 lb 3.2 oz (84.9 kg)   SpO2 97%   BMI 32.13 kg/m        ASSESSMENT:  Diabetes type 2 on oral agents  See history of present illness for detailed discussion of current diabetes management, blood sugar patterns and problems identified  He has an improved A1c of 6.9, previously 7.5  Please continue to benefit from [place] Also taking additional metformin for total dose of 1500 mg daily He is just getting off glipizide even though he was supposed to stop this previously and blood sugars are not looking any different without it He is able to lose 4 pounds since his last visit Usually watching his diet  He has some limitations with exercise but trying to be active   HYPERTENSION: Blood pressure controlled and he will stay on the same dose of losartan Continue monitoring at home  Needs to schedule eye exam  PLAN:  Continue Synjardy and metformin Regular exercise He will try to start using the One Touch Verio monitor again since it is easier to download and his insurance company provided meter Follow-up in 4 months  Patient Instructions  Check blood sugars on waking up days a week  Also check blood sugars about 2 hours after meals and do this after different meals by rotation  Recommended blood sugar levels on waking up are 90-130 and about 2 hours after meal is 130-160  Please bring your blood sugar monitor to each visit, thank you       Elayne Snare 09/10/2018, 10:23 AM   Note: This office note was prepared with Dragon voice recognition system technology. Any transcriptional errors that result from this process are unintentional.

## 2018-09-10 NOTE — Patient Instructions (Signed)
Check blood sugars on waking up days a week  Also check blood sugars about 2 hours after meals and do this after different meals by rotation  Recommended blood sugar levels on waking up are 90-130 and about 2 hours after meal is 130-160  Please bring your blood sugar monitor to each visit, thank you   

## 2018-09-18 ENCOUNTER — Other Ambulatory Visit: Payer: Self-pay

## 2018-09-18 ENCOUNTER — Other Ambulatory Visit: Payer: Managed Care, Other (non HMO) | Admitting: Internal Medicine

## 2018-09-18 DIAGNOSIS — Z125 Encounter for screening for malignant neoplasm of prostate: Secondary | ICD-10-CM

## 2018-09-18 DIAGNOSIS — I10 Essential (primary) hypertension: Secondary | ICD-10-CM

## 2018-09-18 DIAGNOSIS — Z0000 Encounter for general adult medical examination without abnormal findings: Secondary | ICD-10-CM

## 2018-09-18 DIAGNOSIS — E1169 Type 2 diabetes mellitus with other specified complication: Secondary | ICD-10-CM

## 2018-09-18 DIAGNOSIS — E119 Type 2 diabetes mellitus without complications: Secondary | ICD-10-CM

## 2018-09-19 LAB — PSA: PSA: 1.2 ng/mL (ref <=4.0)

## 2018-09-19 LAB — CBC WITH DIFFERENTIAL/PLATELET
Absolute Monocytes: 655 cells/uL (ref 200–950)
Basophils Absolute: 30 cells/uL (ref 0–200)
Basophils Relative: 0.5 %
Eosinophils Absolute: 301 cells/uL (ref 15–500)
Eosinophils Relative: 5.1 %
HCT: 47.4 % (ref 38.5–50.0)
Hemoglobin: 15.2 g/dL (ref 13.2–17.1)
Lymphs Abs: 1392 cells/uL (ref 850–3900)
MCH: 26.5 pg — ABNORMAL LOW (ref 27.0–33.0)
MCHC: 32.1 g/dL (ref 32.0–36.0)
MCV: 82.6 fL (ref 80.0–100.0)
MPV: 11.1 fL (ref 7.5–12.5)
Monocytes Relative: 11.1 %
Neutro Abs: 3522 cells/uL (ref 1500–7800)
Neutrophils Relative %: 59.7 %
Platelets: 229 10*3/uL (ref 140–400)
RBC: 5.74 10*6/uL (ref 4.20–5.80)
RDW: 13.6 % (ref 11.0–15.0)
Total Lymphocyte: 23.6 %
WBC: 5.9 10*3/uL (ref 3.8–10.8)

## 2018-09-19 LAB — COMPLETE METABOLIC PANEL WITH GFR
AG Ratio: 2 (calc) (ref 1.0–2.5)
ALT: 18 U/L (ref 9–46)
AST: 15 U/L (ref 10–35)
Albumin: 4.4 g/dL (ref 3.6–5.1)
Alkaline phosphatase (APISO): 68 U/L (ref 35–144)
BUN: 15 mg/dL (ref 7–25)
CO2: 26 mmol/L (ref 20–32)
Calcium: 9.3 mg/dL (ref 8.6–10.3)
Chloride: 105 mmol/L (ref 98–110)
Creat: 1.02 mg/dL (ref 0.70–1.25)
GFR, Est African American: 88 mL/min/{1.73_m2} (ref >=60)
GFR, Est Non African American: 76 mL/min/{1.73_m2} (ref >=60)
Globulin: 2.2 g/dL (calc) (ref 1.9–3.7)
Glucose, Bld: 104 mg/dL — ABNORMAL HIGH (ref 65–99)
Potassium: 4.2 mmol/L (ref 3.5–5.3)
Sodium: 144 mmol/L (ref 135–146)
Total Bilirubin: 0.4 mg/dL (ref 0.2–1.2)
Total Protein: 6.6 g/dL (ref 6.1–8.1)

## 2018-09-19 LAB — HEMOGLOBIN A1C
Hgb A1c MFr Bld: 6.7 % of total Hgb — ABNORMAL HIGH (ref <5.7)
Mean Plasma Glucose: 146 (calc)
eAG (mmol/L): 8.1 (calc)

## 2018-09-19 LAB — LIPID PANEL
Cholesterol: 84 mg/dL (ref <200)
HDL: 36 mg/dL — ABNORMAL LOW (ref >=40)
LDL Cholesterol (Calc): 33 mg/dL (calc)
Non-HDL Cholesterol (Calc): 48 mg/dL (calc) (ref <130)
Total CHOL/HDL Ratio: 2.3 (calc) (ref <5.0)
Triglycerides: 71 mg/dL (ref <150)

## 2018-09-22 ENCOUNTER — Other Ambulatory Visit: Payer: Self-pay

## 2018-09-22 ENCOUNTER — Encounter: Payer: Self-pay | Admitting: Internal Medicine

## 2018-09-22 ENCOUNTER — Ambulatory Visit (INDEPENDENT_AMBULATORY_CARE_PROVIDER_SITE_OTHER): Payer: Managed Care, Other (non HMO) | Admitting: Internal Medicine

## 2018-09-22 VITALS — BP 120/80 | HR 71 | Temp 98.2°F | Ht 64.0 in | Wt 186.0 lb

## 2018-09-22 DIAGNOSIS — E1169 Type 2 diabetes mellitus with other specified complication: Secondary | ICD-10-CM

## 2018-09-22 DIAGNOSIS — E786 Lipoprotein deficiency: Secondary | ICD-10-CM | POA: Diagnosis not present

## 2018-09-22 DIAGNOSIS — Z0000 Encounter for general adult medical examination without abnormal findings: Secondary | ICD-10-CM | POA: Diagnosis not present

## 2018-09-22 DIAGNOSIS — Z8673 Personal history of transient ischemic attack (TIA), and cerebral infarction without residual deficits: Secondary | ICD-10-CM

## 2018-09-22 DIAGNOSIS — Z6831 Body mass index (BMI) 31.0-31.9, adult: Secondary | ICD-10-CM

## 2018-09-22 DIAGNOSIS — I10 Essential (primary) hypertension: Secondary | ICD-10-CM

## 2018-09-22 DIAGNOSIS — Z23 Encounter for immunization: Secondary | ICD-10-CM | POA: Diagnosis not present

## 2018-09-22 DIAGNOSIS — E785 Hyperlipidemia, unspecified: Secondary | ICD-10-CM

## 2018-09-22 DIAGNOSIS — E8881 Metabolic syndrome: Secondary | ICD-10-CM

## 2018-09-22 LAB — POCT URINALYSIS DIPSTICK
Appearance: NEGATIVE
Bilirubin, UA: NEGATIVE
Blood, UA: NEGATIVE
Glucose, UA: POSITIVE — AB
Ketones, UA: NEGATIVE
Leukocytes, UA: NEGATIVE
Nitrite, UA: NEGATIVE
Odor: NEGATIVE
Protein, UA: NEGATIVE
Spec Grav, UA: 1.01 (ref 1.010–1.025)
Urobilinogen, UA: 0.2 E.U./dL
pH, UA: 6.5 (ref 5.0–8.0)

## 2018-09-22 NOTE — Progress Notes (Signed)
Subjective:    Patient ID: Mike Parker, male    DOB: 27-Jan-1951, 68 y.o.   MRN: [id number]  HPI 68 year old Male for [employer], health maintenance exam and evaluation of medical issues.  Patient is enjoying his retirement.  He and his wife involving him home in the [place] area and will be moving soon.  Patient's mother-in-law was moving in with them.  She is a widow.  Daughter and  2 grandchildren also live with them.  He has a history of diabetes mellitus which is under good control with the assistance of Dr. Dwyane Dee.  History of essential hypertension, hyperlipidemia, metabolic syndrome and obesity.  He had a left thalamic CVA June 2014.  2D echocardiogram at that time showed moderate LVH and diastolic dysfunction.  Renal artery duplex scan was negative for renal artery stenosis.  He still has some paresthesias in his right hand status post CVA but has recovered well.  He takes Ativan for insomnia which started after his stroke.  The stroke frightened him.  His mother died of a stroke and it was disconcerting to him.  He was evaluated for sleep apnea several years ago.  Dr.Clance thought he just needed to lose weight.  He has lost weight.  History of present shoulder previously treated by Dr. Suszanne Conners  No known drug allergies  Social history: He is married.  He is retired from [employer] where he worked for many years.  2 adult daughters and 2 grandchildren.  Does not smoke or consume alcohol.  Wife continues to work from home during the pandemic for [employer].    Review of Systems no new complaints- feels well     Objective:   Physical Exam Blood pressure 120/80, pulse 71, pulse oximetry 98% temperature 98.2 degrees weight 186 pounds.  BMI 31.93  Skin warm and dry.  Nodes none.  HEENT exam TMs and pharynx clear.  Neck is supple without JVD thyromegaly or carotid bruits.  Chest clear to auscultation.  Cardiac exam regular rate and rhythm normal S1 and S2 without murmurs or  gallops.  Abdomen soft nondistended without hepatosplenomegaly masses or tenderness.  Prostate is normal without nodules.  No lower extremity edema.  Neuro intact without focal deficits.  Mentation thought and judgment are normal.       Assessment & Plan:  Essential hypertension-stable on current regimen  Type 2 diabetes mellitus-stable and followed by Dr. Dwyane Dee.  Metabolic syndrome  Hyperlipidemia treated with statin therapy-history of low HDL.  History of left thalamic CVA with resultant paresthesia and hand but no weakness.  BMI 31.93.  Would like BMI to be 30 or less.  However he looks well and is trying to get exercise.    Follow-up in 6 months.  Reminded about annual diabetic eye exam and flu vaccine.  Pneumovax 23 vaccine given.  Subjective:   Patient presents for Medicare Annual/Subsequent preventive examination.  Review Past Medical/Family/Social: See above   Risk Factors  Current exercise habits: Some exercise with grandchildren Dietary issues discussed: Low-fat low carbohydrate  Cardiac risk factors: Diabetes mellitus, hypertension, hyperlipidemia, history of stroke, obesity which is mild  Depression Screen  (Note: if answer to either of the following is "Yes", a more complete depression screening is indicated)   Over the past two weeks, have you felt down, depressed or hopeless? No  Over the past two weeks, have you felt little interest or pleasure in doing things? No Have you lost interest or pleasure in daily  life? No Do you often feel hopeless? No Do you cry easily over simple problems? No   Activities of Daily Living  In your present state of health, do you have any difficulty performing the following activities?:   Driving? No  Managing money? No  Feeding yourself? No  Getting from bed to chair? No  Climbing a flight of stairs? No  Preparing food and eating?: No  Bathing or showering? No  Getting dressed: No  Getting to the toilet? No  Using the  toilet:No  Moving around from place to place: No  In the past year have you fallen or had a near fall?:No  Are you sexually active?  Yes Do you have more than one partner? No   Hearing Difficulties: No  Do you often ask people to speak up or repeat themselves? No  Do you experience ringing or noises in your ears?  Yes Do you have difficulty understanding soft or whispered voices? No  Do you feel that you have a problem with memory? No Do you often misplace items? No    Home Safety:  Do you have a smoke alarm at your residence? Yes Do you have grab bars in the bathroom?  None Do you have throw rugs in your house?  No   Cognitive Testing  Alert? Yes Normal Appearance?Yes  Oriented to person? Yes Place? Yes  Time? Yes  Recall of three objects? Yes  Can perform simple calculations? Yes  Displays appropriate judgment?Yes  Can read the correct time from a watch face?Yes   List the Names of Other Physician/Practitioners you currently use:  See referral list for the physicians patient is currently seeing.  Dr. Lucianne MussKumar     Review of Systems: See above   Objective:     General appearance: Appears younger than stated age  Head: Normocephalic, without obvious abnormality, atraumatic  Eyes: conj clear, EOMi PEERLA  Ears: normal TM's and external ear canals both ears  Nose: Nares normal. Septum midline. Mucosa normal. No drainage or sinus tenderness.  Throat: lips, mucosa, and tongue normal; teeth and gums normal  Neck: no adenopathy, no carotid bruit, no JVD, supple, symmetrical, trachea midline and thyroid not enlarged, symmetric, no tenderness/mass/nodules  No CVA tenderness.  Lungs: clear to auscultation bilaterally  Breasts: normal appearance, no masses or tenderness Heart: regular rate and rhythm, S1, S2 normal, no murmur, click, rub or gallop  Abdomen: soft, non-tender; bowel sounds normal; no masses, no organomegaly  Musculoskeletal: ROM normal in all joints, no  crepitus, no deformity, Normal muscle strengthen. Back  is symmetric, no curvature. Skin: Skin color, texture, turgor normal. No rashes or lesions  Lymph nodes: Cervical, supraclavicular, and axillary nodes normal.  Neurologic: CN 2 -12 Normal, Normal symmetric reflexes. Normal coordination and gait  Psych: Alert & Oriented x 3, Mood appear stable.    Assessment:    Annual wellness medicare exam   Plan:    During the course of the visit the patient was educated and counseled about appropriate screening and preventive services including:   Annual flu vaccine  Pneumovax 23 given  Prevnar 13 given last year     Patient Instructions (the written plan) was given to the patient.  Medicare Attestation  I have personally reviewed:  The patient's medical and social history  Their use of alcohol, tobacco or illicit drugs  Their current medications and supplements  The patient's functional ability including ADLs,fall risks, home safety risks, cognitive, and hearing and visual impairment  Diet  and physical activities  Evidence for depression or mood disorders  The patient's weight, height, BMI, and visual acuity have been recorded in the chart. I have made referrals, counseling, and provided education to the patient based on review of the above and I have provided the patient with a written personalized care plan for preventive services.

## 2018-09-30 ENCOUNTER — Other Ambulatory Visit: Payer: Self-pay

## 2018-09-30 ENCOUNTER — Encounter: Payer: Self-pay | Admitting: Internal Medicine

## 2018-09-30 ENCOUNTER — Ambulatory Visit (INDEPENDENT_AMBULATORY_CARE_PROVIDER_SITE_OTHER): Payer: Managed Care, Other (non HMO) | Admitting: Internal Medicine

## 2018-09-30 DIAGNOSIS — Z23 Encounter for immunization: Secondary | ICD-10-CM | POA: Diagnosis not present

## 2018-09-30 NOTE — Patient Instructions (Signed)
Patient received a flu vaccine IM R deltoid, AV, CMA  

## 2018-09-30 NOTE — Progress Notes (Signed)
Flu vaccine given by CMA 

## 2018-10-18 ENCOUNTER — Encounter: Payer: Self-pay | Admitting: Internal Medicine

## 2018-10-18 NOTE — Patient Instructions (Signed)
It was a pleasure to see you today.  Pneumococcal 23 vaccine given.  Flu vaccine to be given at another time.  Continue diet exercise and weight loss efforts.  Follow-up in 6 months.  Continue current medications.

## 2018-11-08 ENCOUNTER — Other Ambulatory Visit: Payer: Self-pay | Admitting: Internal Medicine

## 2018-12-24 ENCOUNTER — Other Ambulatory Visit: Payer: Self-pay | Admitting: Internal Medicine

## 2019-01-01 ENCOUNTER — Other Ambulatory Visit: Payer: Self-pay | Admitting: Internal Medicine

## 2019-01-11 ENCOUNTER — Other Ambulatory Visit: Payer: Medicare Other

## 2019-01-14 ENCOUNTER — Ambulatory Visit: Payer: Medicare Other | Admitting: Endocrinology

## 2019-01-14 ENCOUNTER — Other Ambulatory Visit: Payer: Self-pay | Admitting: Endocrinology

## 2019-01-18 DIAGNOSIS — E119 Type 2 diabetes mellitus without complications: Secondary | ICD-10-CM | POA: Diagnosis not present

## 2019-01-18 DIAGNOSIS — H18513 Endothelial corneal dystrophy, bilateral: Secondary | ICD-10-CM | POA: Diagnosis not present

## 2019-01-18 DIAGNOSIS — H35413 Lattice degeneration of retina, bilateral: Secondary | ICD-10-CM | POA: Diagnosis not present

## 2019-01-18 DIAGNOSIS — H25813 Combined forms of age-related cataract, bilateral: Secondary | ICD-10-CM | POA: Diagnosis not present

## 2019-01-18 LAB — HM DIABETES EYE EXAM

## 2019-01-25 ENCOUNTER — Other Ambulatory Visit: Payer: Self-pay | Admitting: Internal Medicine

## 2019-02-04 ENCOUNTER — Other Ambulatory Visit: Payer: Self-pay

## 2019-02-04 ENCOUNTER — Other Ambulatory Visit (INDEPENDENT_AMBULATORY_CARE_PROVIDER_SITE_OTHER): Payer: Managed Care, Other (non HMO)

## 2019-02-04 DIAGNOSIS — E669 Obesity, unspecified: Secondary | ICD-10-CM

## 2019-02-04 DIAGNOSIS — E1169 Type 2 diabetes mellitus with other specified complication: Secondary | ICD-10-CM | POA: Diagnosis not present

## 2019-02-04 LAB — MICROALBUMIN / CREATININE URINE RATIO
Creatinine,U: 127.8 mg/dL
Microalb Creat Ratio: 2.5 mg/g (ref 0.0–30.0)
Microalb, Ur: 3.1 mg/dL — ABNORMAL HIGH (ref 0.0–1.9)

## 2019-02-04 LAB — HEMOGLOBIN A1C: Hgb A1c MFr Bld: 6.8 % — ABNORMAL HIGH (ref 4.6–6.5)

## 2019-02-04 LAB — BASIC METABOLIC PANEL
BUN: 17 mg/dL (ref 6–23)
CO2: 28 mEq/L (ref 19–32)
Calcium: 9 mg/dL (ref 8.4–10.5)
Chloride: 103 mEq/L (ref 96–112)
Creatinine, Ser: 1.15 mg/dL (ref 0.40–1.50)
GFR: 76.69 mL/min (ref >60.00)
Glucose, Bld: 85 mg/dL (ref 70–99)
Potassium: 3.9 mEq/L (ref 3.5–5.1)
Sodium: 139 mEq/L (ref 135–145)

## 2019-02-05 ENCOUNTER — Other Ambulatory Visit: Payer: Medicare Other

## 2019-02-06 ENCOUNTER — Other Ambulatory Visit: Payer: Self-pay | Admitting: Endocrinology

## 2019-02-08 ENCOUNTER — Other Ambulatory Visit: Payer: Self-pay

## 2019-02-10 ENCOUNTER — Ambulatory Visit (INDEPENDENT_AMBULATORY_CARE_PROVIDER_SITE_OTHER): Payer: Managed Care, Other (non HMO) | Admitting: Endocrinology

## 2019-02-10 ENCOUNTER — Encounter: Payer: Self-pay | Admitting: Endocrinology

## 2019-02-10 VITALS — BP 120/82 | HR 69 | Ht 64.0 in | Wt 177.0 lb

## 2019-02-10 DIAGNOSIS — E669 Obesity, unspecified: Secondary | ICD-10-CM

## 2019-02-10 DIAGNOSIS — E1169 Type 2 diabetes mellitus with other specified complication: Secondary | ICD-10-CM

## 2019-02-10 DIAGNOSIS — I10 Essential (primary) hypertension: Secondary | ICD-10-CM

## 2019-02-10 NOTE — Progress Notes (Signed)
Patient ID: Mike Parker, male   DOB: 1950-09-28, 69 y.o.   MRN: [id number]           Reason for Appointment: Follow-up for Type 2 Diabetes     History of Present Illness:          Date of diagnosis of type 2 diabetes mellitus: ?  2011        Background history:   He appears to have been on glipizide and Janumet for several years with fair control His A1c had been over 7% since 2015 He was referred here because of an A1c of 9.2% done in 6/17  Recent history:   His A1c is 6.8 and stable  Fructosamine previously 229  Non-insulin hypoglycemic drugs: Synjardy 10/998,  metformin ER 500 mg PCS  Current management, blood sugar patterns and problems identified:   His glipizide was stopped 08/2018  Blood sugars do not appear to be any higher  He is still has not switched to the brand name blood sugar meter and still using the meter provided by his insurance company  Overall blood sugars appear to be fairly good and stable  Not clear if he checks blood sugars on waking but the lab glucose was 85 fasting  He also has lost about 9 pounds but is not clear why  He is not changing his diet recently  Continuing Synjardy and having no side effects or renal dysfunction from this        Side effects from medications have been: Diarrhea from high-dose metformin  Compliance with the medical regimen: Fair  Glucose monitoring:  done 1 + times a day         Glucometer: One Touch Blood Glucose readings by review of meter:   PRE-MEAL  midday Lunch Dinner Bedtime Overall  Glucose range:  111-160   127  117   Mean/median:        POST-MEAL PC Breakfast PC Lunch PC Dinner  Glucose range:  99, 165   Mean/median:      PREVIOUS readings:  PRE-MEAL  before meals Lunch  after meals Bedtime Overall  Glucose range:  68-139   111-177    Mean/median:     116    Self-care:  Meal times are:  Breakfast is skipped, lunch 12 noon, dinner usually 7-8 PM  Typical meal intake: Breakfast is  near noon egg or meat/ cereal.   Dinner is chicken or pork, greens, corn; snacks usually nuts, fruits            Dietician visit, most recent: 8/17                Weight history:  Wt Readings from Last 3 Encounters:  02/10/19 177 lb (80.3 kg)  09/30/18 186 lb (84.4 kg)  09/22/18 186 lb (84.4 kg)    Glycemic control:   Lab Results  Component Value Date   HGBA1C 6.8 (H) 02/04/2019   HGBA1C 6.7 (H) 09/18/2018   HGBA1C 6.9 (H) 09/08/2018   Lab Results  Component Value Date   MICROALBUR 3.1 (H) 02/04/2019   LDLCALC 33 09/18/2018   CREATININE 1.15 02/04/2019   Lab Results  Component Value Date   MICRALBCREAT 2.5 02/04/2019    Other problems discussed: See review of systems    Allergies as of 02/10/2019   No Known Allergies     Medication List       Accurate as of February 10, 2019  9:04 AM. If you have any questions, ask  your nurse or doctor.        amLODipine 10 MG tablet Commonly known as: NORVASC TAKE 1 TABLET DAILY   aspirin 81 MG EC tablet Take 1 tablet (81 mg total) by mouth daily.   atorvastatin 10 MG tablet Commonly known as: LIPITOR TAKE 1 TABLET AT SUPPER FORLIPID LOWERING   blood glucose meter kit and supplies Dispense based on patient and insurance preference. Test twice daily; dx code: H88.5   Bystolic 10 MG tablet Generic drug: nebivolol TAKE 1 TABLET DAILY   cloNIDine 0.1 MG tablet Commonly known as: CATAPRES TAKE 1 TABLET TWICE A DAY   econazole nitrate 1 % cream Apply topically daily.   Empagliflozin-metFORMIN HCl ER 10-998 MG Tb24 Commonly known as: Synjardy XR Take 1 tablet by mouth daily with breakfast.   fluticasone 50 MCG/ACT nasal spray Commonly known as: FLONASE USE TWO SPRAY(S) IN EACH NOSTRIL ONCE DAILY   freestyle lancets   furosemide 40 MG tablet Commonly known as: LASIX TAKE 1 TABLET DAILY   losartan 100 MG tablet Commonly known as: COZAAR TAKE 1 TABLET DAILY   metFORMIN 500 MG 24 hr tablet Commonly  known as: GLUCOPHAGE-XR TAKE 1 TABLET BY MOUTH ONCE DAILY WITH SUPPER   OneTouch Verio test strip Generic drug: glucose blood USE TO CHECK BLOOD SUGAR ONCE A DAY       Allergies: No Known Allergies  Past Medical History:  Diagnosis Date  . Allergy   . Diabetes mellitus   . Hypertension   . Insomnia   . LVH (left ventricular hypertrophy)   . Stroke (cerebrum) ([place]) 2014    Past Surgical History:  Procedure Laterality Date  . COLONOSCOPY    . ELBOW SURGERY     rt elbow  . HERNIA REPAIR     left inguinal  . KNEE SURGERY     rt and left    Family History  Problem Relation Age of Onset  . Stroke Mother   . Hypertension Brother   . Colon cancer Neg Hx   . Diabetes Neg Hx     Social History:  reports that he has never smoked. He has never used smokeless tobacco. He reports that he does not drink alcohol or use drugs.   Review of Systems   Lipid history: On treatment long-term with Lipitor 10 mg daily from PCP    Lab Results  Component Value Date   CHOL 84 09/18/2018   HDL 36 (L) 09/18/2018   LDLCALC 33 09/18/2018   TRIG 71 09/18/2018   CHOLHDL 2.3 09/18/2018           Hypertension:Treated for >15 years and followed by PCP Losartan has not been changed despite going on Synjardy Also was told to take only half the dose of Lasix when starting Synjardy  He will occasionally check his blood pressure at home and he thinks blood pressure readings are normal  BP Readings from Last 3 Encounters:  02/10/19 120/82  09/30/18 110/80  09/22/18 120/80    Most recent eye exam was documented in 12/2018  Most recent foot exam: 7/19 by PCP    LABS:  Lab on 02/04/2019  Component Date Value Ref Range Status  . Microalb, Ur 02/04/2019 3.1* 0.0 - 1.9 mg/dL Final  . Creatinine,U 02/04/2019 127.8  mg/dL Final  . Microalb Creat Ratio 02/04/2019 2.5  0.0 - 30.0 mg/g Final  . Sodium 02/04/2019 139  135 - 145 mEq/L Final  . Potassium 02/04/2019 3.9  3.5 - 5.1 mEq/L  Final  . Chloride 02/04/2019 103  96 - 112 mEq/L Final  . CO2 02/04/2019 28  19 - 32 mEq/L Final  . Glucose, Bld 02/04/2019 85  70 - 99 mg/dL Final  . BUN 02/04/2019 17  6 - 23 mg/dL Final  . Creatinine, Ser 02/04/2019 1.15  0.40 - 1.50 mg/dL Final  . GFR 02/04/2019 76.69  >60.00 mL/min Final  . Calcium 02/04/2019 9.0  8.4 - 10.5 mg/dL Final  . Hgb A1c MFr Bld 02/04/2019 6.8* 4.6 - 6.5 % Final   Glycemic Control Guidelines for People with Diabetes:Non Diabetic:  <6%Goal of Therapy: <7%Additional Action Suggested:  >8%     Physical Examination:  BP 120/82   Pulse 69   Ht 5' 4"  (1.626 m)   Wt 177 lb (80.3 kg)   SpO2 97%   BMI 30.38 kg/m        ASSESSMENT:  Diabetes type 2 on oral agents  See history of present illness for detailed discussion of current diabetes management, blood sugar patterns and problems identified  His A1c is stable at 6.8  Benefiting from [place] and continues to lose weight also although BMI still just over 30 He has done some walking and usually watching his diet well Blood sugars at home are looking excellent with highest reading 165 recently  HYPERTENSION: Blood pressure controlled with 100 mg of losartan Also to monitor at home in between visits  No change in renal function or potassium with continued losartan and Jardiance Continues to have normal urine microalbumin also  Eye exam report reviewed  PLAN:  Continue Synjardy and metformin as before Reminded him that he does not need to go back on glipizide  Regular walking for exercise  He will switch to using the One Touch Verio monitor and prescription [facility] Discussed timing of blood sugar monitoring and to rotate timing of glucose monitoring at various times  Periodically check blood pressure at home and let us know if it is out of range  Follow-up in 4 months  There are no Patient Instructions on file for this visit.    Elayne Snare 02/10/2019, 9:04 AM   Note:  This office note was prepared with Dragon voice recognition system technology. Any transcriptional errors that result from this process are unintentional.

## 2019-02-11 ENCOUNTER — Other Ambulatory Visit: Payer: Self-pay

## 2019-02-11 ENCOUNTER — Telehealth: Payer: Self-pay

## 2019-02-11 MED ORDER — ONETOUCH VERIO FLEX SYSTEM W/DEVICE KIT: 1.0000 | PACK | Freq: Every day | 0 refills | Status: AC

## 2019-02-11 NOTE — Telephone Encounter (Signed)
Rx sent 

## 2019-02-11 NOTE — Telephone Encounter (Signed)
Patient called in stating that Dr informed him yesterday that he would be sending in a Rx for a new One Touch Verio  meter and wanted to know if he sent the Rx I didn't see anything in the chart     Please advise    [id number] - [place], [place] - [address]

## 2019-02-18 ENCOUNTER — Other Ambulatory Visit: Payer: Self-pay

## 2019-02-18 ENCOUNTER — Telehealth: Payer: Self-pay | Admitting: Endocrinology

## 2019-02-18 MED ORDER — ONETOUCH VERIO VI STRP: ORAL_STRIP | 2 refills | Status: DC

## 2019-02-18 NOTE — Telephone Encounter (Signed)
Rx sent 

## 2019-02-18 NOTE — Telephone Encounter (Signed)
MEDICATION:  One Touch Verio Strips  PHARMACY:  [facility] [address]  IS THIS A 90 DAY SUPPLY :   IS PATIENT OUT OF MEDICATION: new RX  IF NOT; HOW MUCH IS LEFT:   LAST APPOINTMENT DATE: @1 /14/2021  NEXT APPOINTMENT DATE:@5 /10/2019  DO WE HAVE YOUR PERMISSION TO LEAVE A DETAILED MESSAGE: yes  OTHER COMMENTS:    **Let patient know to contact pharmacy at the end of the day to make sure medication is ready. **  ** Please notify patient to allow 48-72 hours to process**  **Encourage patient to contact the pharmacy for refills or they can request refills through [facility]**

## 2019-02-24 ENCOUNTER — Other Ambulatory Visit: Payer: Self-pay | Admitting: Endocrinology

## 2019-03-10 ENCOUNTER — Other Ambulatory Visit: Payer: Self-pay | Admitting: Cardiology

## 2019-03-23 ENCOUNTER — Other Ambulatory Visit: Payer: Self-pay

## 2019-03-23 ENCOUNTER — Other Ambulatory Visit: Payer: Medicare Other | Admitting: Internal Medicine

## 2019-03-23 DIAGNOSIS — E1169 Type 2 diabetes mellitus with other specified complication: Secondary | ICD-10-CM | POA: Diagnosis not present

## 2019-03-23 DIAGNOSIS — E785 Hyperlipidemia, unspecified: Secondary | ICD-10-CM

## 2019-03-23 LAB — HEPATIC FUNCTION PANEL
AG Ratio: 1.8 (calc) (ref 1.0–2.5)
ALT: 15 U/L (ref 9–46)
AST: 14 U/L (ref 10–35)
Albumin: 4.2 g/dL (ref 3.6–5.1)
Alkaline phosphatase (APISO): 58 U/L (ref 35–144)
Bilirubin, Direct: 0.3 mg/dL — ABNORMAL HIGH (ref 0.0–0.2)
Globulin: 2.3 g/dL (calc) (ref 1.9–3.7)
Indirect Bilirubin: 0.8 mg/dL (calc) (ref 0.2–1.2)
Total Bilirubin: 1.1 mg/dL (ref 0.2–1.2)
Total Protein: 6.5 g/dL (ref 6.1–8.1)

## 2019-03-23 LAB — LIPID PANEL
Cholesterol: 74 mg/dL (ref <200)
HDL: 37 mg/dL — ABNORMAL LOW (ref >=40)
LDL Cholesterol (Calc): 24 mg/dL (calc)
Non-HDL Cholesterol (Calc): 37 mg/dL (calc) (ref <130)
Total CHOL/HDL Ratio: 2 (calc) (ref <5.0)
Triglycerides: 58 mg/dL (ref <150)

## 2019-03-26 ENCOUNTER — Other Ambulatory Visit: Payer: Self-pay

## 2019-03-26 ENCOUNTER — Encounter: Payer: Self-pay | Admitting: Internal Medicine

## 2019-03-26 ENCOUNTER — Ambulatory Visit (INDEPENDENT_AMBULATORY_CARE_PROVIDER_SITE_OTHER): Payer: Managed Care, Other (non HMO) | Admitting: Internal Medicine

## 2019-03-26 VITALS — BP 130/80 | HR 77 | Temp 98.2°F | Ht 64.0 in | Wt 172.0 lb

## 2019-03-26 DIAGNOSIS — E786 Lipoprotein deficiency: Secondary | ICD-10-CM | POA: Diagnosis not present

## 2019-03-26 DIAGNOSIS — E785 Hyperlipidemia, unspecified: Secondary | ICD-10-CM

## 2019-03-26 DIAGNOSIS — I10 Essential (primary) hypertension: Secondary | ICD-10-CM

## 2019-03-26 DIAGNOSIS — E1169 Type 2 diabetes mellitus with other specified complication: Secondary | ICD-10-CM

## 2019-03-26 DIAGNOSIS — Z8673 Personal history of transient ischemic attack (TIA), and cerebral infarction without residual deficits: Secondary | ICD-10-CM | POA: Diagnosis not present

## 2019-03-26 DIAGNOSIS — E8881 Metabolic syndrome: Secondary | ICD-10-CM | POA: Diagnosis not present

## 2019-03-26 DIAGNOSIS — Z6829 Body mass index (BMI) 29.0-29.9, adult: Secondary | ICD-10-CM | POA: Diagnosis not present

## 2019-03-26 NOTE — Patient Instructions (Signed)
It was a pleasure to see you today.  Keep up the good work.  Physical exam due August 2021.  No change in medications.  Have second Covid 19 vaccine when available.

## 2019-03-26 NOTE — Progress Notes (Signed)
   Subjective:    Patient ID: Mike Parker, male    DOB: 1950-09-27, 69 y.o.   MRN: [id number]  HPI 69 year old Male with history of diabetes mellitus under good control with the assistance of Dr. Lucianne Muss.  History of essential hypertension, hyperlipidemia, metabolic syndrome and obesity.  He is doing well.  His weight is excellent.  History of left thalamic CVA June 2014.  Still has some paresthesias right hand but has recovered well.  Takes Ativan for insomnia which started after his stroke.  Enjoying retirement.  Labs showed total cholesterol 74, triglycerides 58, LDL cholesterol 24.  HDL has always been low and is 37.  Liver panel is normal.  Hemoglobin A1c in January was 6.8%.  Basic metabolic panel in January was normal with fasting glucose of 85, normal BUN and creatinine.  GFR was 76.69.  Reminded about annual diabetic eye exam.  Has had one COVID-19 vaccine.  Second 1 has been scheduled.  BMI is excellent at 29.52  Colonoscopy is up-to-date  Review of Systems no new complaints     Objective:   Physical Exam Weight 172 pounds, BMI 29.52, BP 130/80, pulse 77,T98.2 Skin warm and dry.  Nodes none.  TMs are clear.  Neck is supple No carotid bruits.  Chest clear to auscultation.  Cardiac exam regular rate and rhythm normal S1 and S2.  No lower extremity edema.     Assessment & Plan:  Controlled type 2 diabetes mellitus  Excellent BMI  Essential hypertension-excellent control on current regimen  Hyperlipidemia excellent control with statin medication  History of CVA-controlling risk factors appropriately  Plan: Recommend annual diabetic eye exam.  Have second COVID-19 vaccine in the near future.  Return in 6 months for health maintenance exam or as needed.

## 2019-04-08 ENCOUNTER — Other Ambulatory Visit: Payer: Self-pay | Admitting: Internal Medicine

## 2019-04-08 ENCOUNTER — Other Ambulatory Visit: Payer: Self-pay

## 2019-04-08 MED ORDER — ONETOUCH VERIO VI STRP: ORAL_STRIP | 2 refills | Status: DC

## 2019-05-19 NOTE — Progress Notes (Signed)
Cardiology Office Note    Date:  05/25/2019   ID:  Mike Parker, Mike Parker [birth year], MRN [id number]  PCP:  Elby Showers, MD  Cardiologist: Ena Dawley, MD EPS: None  Chief Complaint  Patient presents with  . Follow-up    History of Present Illness:  Mike Parker is a 69 y.o. male with history of hypertension, DM, CVA 06/2012, HLD  Last had telemedicine visit with Melina Copa 05/21/18 at which time he was doing well.  Patient comes in for f/u. Has been vaccinated for covid19. Denies chest pain, dyspnea, dizziness or presyncope. Works in the yard, walks 1 hr 5 days/week.  Past Medical History:  Diagnosis Date  . Allergy   . Diabetes mellitus   . Hypertension   . Insomnia   . LVH (left ventricular hypertrophy)   . Stroke (cerebrum) ([place]) 2014    Past Surgical History:  Procedure Laterality Date  . COLONOSCOPY    . ELBOW SURGERY     rt elbow  . HERNIA REPAIR     left inguinal  . KNEE SURGERY     rt and left    Current Medications: Current Meds  Medication Sig  . amLODipine (NORVASC) 10 MG tablet TAKE 1 TABLET DAILY  . aspirin EC 81 MG EC tablet Take 1 tablet (81 mg total) by mouth daily.  Marland Kitchen atorvastatin (LIPITOR) 10 MG tablet TAKE 1 TABLET AT SUPPER FORLIPID LOWERING  . blood glucose meter kit and supplies Dispense based on patient and insurance preference. Test twice daily; dx code: E11.9  . Blood Glucose Monitoring Suppl ([place]) w/Device KIT 1 each by Does not apply route daily. Use onetouch verio flex to check blood sugar once daily.  Marland Kitchen BYSTOLIC 10 MG tablet TAKE 1 TABLET DAILY  . cloNIDine (CATAPRES) 0.1 MG tablet TAKE 1 TABLET TWICE A DAY  . econazole nitrate 1 % cream Apply topically daily.  . fluticasone (FLONASE) 50 MCG/ACT nasal spray USE TWO SPRAY(S) IN EACH NOSTRIL ONCE DAILY  . furosemide (LASIX) 40 MG tablet Take 0.5 tablets (20 mg total) by mouth daily.  Marland Kitchen glucose blood (ONETOUCH VERIO) test strip USE TO CHECK BLOOD SUGAR ONCE  A DAY  . Lancets (FREESTYLE) lancets   . losartan (COZAAR) 100 MG tablet TAKE 1 TABLET DAILY  . metFORMIN (GLUCOPHAGE-XR) 500 MG 24 hr tablet TAKE 1 TABLET BY MOUTH ONCE DAILY WITH SUPPER  . SYNJARDY XR 10-998 MG TB24 Take 1 tablet by mouth once daily with breakfast  . [DISCONTINUED] furosemide (LASIX) 40 MG tablet TAKE 1 TABLET DAILY     Allergies:   Patient has no known allergies.   Social History   Socioeconomic History  . Marital status: Married    Spouse name: Not on file  . Number of children: 2  . Years of education: BS  . Highest education level: Not on file  Occupational History  . 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 Smoking status: Never Smoker  . Smokeless tobacco: Never Used  Substance and Sexual Activity  . Alcohol use: No  . Drug use: No  . Sexual activity: Yes  Other Topics Concern  . Not on file  Social History Narrative  . Not on file   Social Determinants of Health   Financial Resource Strain:   . Difficulty of Paying Living Expenses:   Food Insecurity:   . Worried About Charity fundraiser in the Last Year:   . [employer]  in the Last Year:   Transportation Needs:   . Film/video editor (Medical):   Marland Kitchen Lack of Transportation (Non-Medical):   Physical Activity:   . Days of Exercise per Week:   . Minutes of Exercise per Session:   Stress:   . Feeling of Stress :   Social Connections:   . Frequency of Communication with Friends and Family:   . Frequency of Social Gatherings with Friends and Family:   . Attends Religious Services:   . Active Member of Clubs or Organizations:   . Attends Archivist Meetings:   Marland Kitchen Marital Status:      Family History:  The patient's family history includes Hypertension in his brother; Stroke in his mother.   ROS:   Please see the history of present illness.    ROS All other systems reviewed and are negative.   PHYSICAL EXAM:   VS:  BP 126/82 (BP Location: Left Arm, Patient  Position: Sitting, Cuff Size: Normal)   Pulse 65   Ht 5' 4"  (1.626 m)   Wt 187 lb 12.8 oz (85.2 kg)   SpO2 95%   BMI 32.24 kg/m   Physical Exam  GEN: Well nourished, well developed, in no acute distress  Neck: no JVD, carotid bruits, or masses Cardiac:RRR; no murmurs, rubs, or gallops  Respiratory:  clear to auscultation bilaterally, normal work of breathing GI: soft, nontender, nondistended, + BS Ext: without cyanosis, clubbing, or edema, Good distal pulses bilaterally Neuro:  Alert and Oriented x 3 Psych: euthymic mood, full affect  Wt Readings from Last 3 Encounters:  05/25/19 187 lb 12.8 oz (85.2 kg)  03/26/19 172 lb (78 kg)  02/10/19 177 lb (80.3 kg)      Studies/Labs Reviewed:   EKG:  EKG is  ordered today.  The ekg ordered today demonstrates NSR, normal EKG  Recent Labs: 09/18/2018: Hemoglobin 15.2; Platelets 229 02/04/2019: BUN 17; Creatinine, Ser 1.15; Potassium 3.9; Sodium 139 03/23/2019: ALT 15   Lipid Panel    Component Value Date/Time   CHOL 74 03/23/2019 0932   TRIG 58 03/23/2019 0932   HDL 37 (L) 03/23/2019 0932   CHOLHDL 2.0 03/23/2019 0932   VLDL 19 07/23/2016 1011   LDLCALC 24 03/23/2019 0932    Additional studies/ records that were reviewed today include:  2D echo 6/17/14Study Conclusions   - Left ventricle: The cavity size was normal. There was    moderate concentric hypertrophy. Systolic function was    normal. The estimated ejection fraction was in the range    of 60% to 65%. Wall motion was normal; there were no    regional wall motion abnormalities.  - Left atrium: The atrium was mildly dilated.  Transthoracic echocardiography.  M-mode, complete 2D,  spectral Doppler, and color Doppler.  Height:  Height:  165.1cm. Height: 65in.  Weight:  Weight: 96.2kg. Weight:  211.6lb.  Body mass index:  BMI: 35.3kg/m^2.  Body surface  area:    BSA: 2.37m2.  Blood pressure:     161/88.  Patient  status:  Inpatient.  Location:  Bedside.   ----------    Normal renal artery duplex 02/02/13  Carotid Dopplers 2014 Summary:  No significant extracranial carotid artery stenosis  demonstrated. Vertebrals are patent with antegrade flow.   Other specific details can be found in the table(s) above.     Prepared and Electronically Authenticated by    ASSESSMENT:    1. Essential hypertension   2. Hyperlipidemia, unspecified hyperlipidemia type  3. History of CVA (cerebrovascular accident)   4. Type 2 diabetes mellitus without complication, without long-term current use of insulin (HCC)      PLAN:  In order of problems listed above:  Essential hypertension(history of resistent) echo 2014 normal LVEF with moderate LVH-well controlled  Hyperlipidemia-LDL 33 08/2018 on statin   History of traumatic stroke 06/2012 blood pressures elevated around the time of the stroke. Renal artery duplex and carotid Dopplers normal in 2014. On ASA and statin  DM type II followed by endocrine  Medication Adjustments/Labs and Tests Ordered: Current medicines are reviewed at length with the patient today.  Concerns regarding medicines are outlined above.  Medication changes, Labs and Tests ordered today are listed in the Patient Instructions below. Patient Instructions  Medication Instructions:  Your physician recommends that you continue on your current medications as directed. Please refer to the Current Medication list given to you today.  *If you need a refill on your cardiac medications before your next appointment, please call your pharmacy*   Lab Work: -None If you have labs (blood work) drawn today and your tests are completely normal, you will receive your results only by: Marland Kitchen MyChart Message (if you have MyChart) OR . A paper copy in the mail If you have any lab test that is abnormal or we need to change your treatment, we will call you to review the results.   Testing/Procedures: -None   Follow-Up: At [facility], you and your health  needs are our priority.  As part of our continuing mission to provide you with exceptional heart care, we have created designated Provider Care Teams.  These Care Teams include your primary Cardiologist (physician) and Advanced Practice Providers (APPs -  Physician Assistants and Nurse Practitioners) who all work together to provide you with the care you need, when you need it.  We recommend signing up for the patient portal called "MyChart".  Sign up information is provided on this After Visit Summary.  MyChart is used to connect with patients for Virtual Visits (Telemedicine).  Patients are able to view lab/test results, encounter notes, upcoming appointments, etc.  Non-urgent messages can be sent to your provider as well.   To learn more about what you can do with MyChart, go to [email or web address].    Your next appointment:   1 year(s)  The format for your next appointment:   Either In Person or Virtual  Provider:   Ena Dawley, MD   Other Instructions      Signed, [name], PA-C  05/25/2019 10:03 AM    [address], [place], [place]  [address] Phone: [phone]; Fax: [phone]

## 2019-05-25 ENCOUNTER — Other Ambulatory Visit: Payer: Self-pay

## 2019-05-25 ENCOUNTER — Ambulatory Visit (INDEPENDENT_AMBULATORY_CARE_PROVIDER_SITE_OTHER): Payer: Managed Care, Other (non HMO) | Admitting: Physician Assistant

## 2019-05-25 ENCOUNTER — Encounter: Payer: Self-pay | Admitting: Physician Assistant

## 2019-05-25 VITALS — BP 126/82 | HR 65 | Ht 64.0 in | Wt 187.8 lb

## 2019-05-25 DIAGNOSIS — E119 Type 2 diabetes mellitus without complications: Secondary | ICD-10-CM

## 2019-05-25 DIAGNOSIS — I10 Essential (primary) hypertension: Secondary | ICD-10-CM

## 2019-05-25 DIAGNOSIS — Z8673 Personal history of transient ischemic attack (TIA), and cerebral infarction without residual deficits: Secondary | ICD-10-CM | POA: Diagnosis not present

## 2019-05-25 DIAGNOSIS — E785 Hyperlipidemia, unspecified: Secondary | ICD-10-CM

## 2019-05-25 MED ORDER — FUROSEMIDE 40 MG PO TABS: 20.0000 mg | ORAL_TABLET | Freq: Every day | ORAL | 3 refills | Status: DC

## 2019-05-25 NOTE — Patient Instructions (Signed)
Medication Instructions:  Your physician recommends that you continue on your current medications as directed. Please refer to the Current Medication list given to you today.  *If you need a refill on your cardiac medications before your next appointment, please call your pharmacy*   Lab Work: -None If you have labs (blood work) drawn today and your tests are completely normal, you will receive your results only by: Marland Kitchen MyChart Message (if you have MyChart) OR . A paper copy in the mail If you have any lab test that is abnormal or we need to change your treatment, we will call you to review the results.   Testing/Procedures: -None   Follow-Up: At [facility], you and your health needs are our priority.  As part of our continuing mission to provide you with exceptional heart care, we have created designated Provider Care Teams.  These Care Teams include your primary Cardiologist (physician) and Advanced Practice Providers (APPs -  Physician Assistants and Nurse Practitioners) who all work together to provide you with the care you need, when you need it.  We recommend signing up for the patient portal called "MyChart".  Sign up information is provided on this After Visit Summary.  MyChart is used to connect with patients for Virtual Visits (Telemedicine).  Patients are able to view lab/test results, encounter notes, upcoming appointments, etc.  Non-urgent messages can be sent to your provider as well.   To learn more about what you can do with MyChart, go to [email or web address].    Your next appointment:   1 year(s)  The format for your next appointment:   Either In Person or Virtual  Provider:   Tobias Alexander, MD   Other Instructions

## 2019-05-30 ENCOUNTER — Other Ambulatory Visit: Payer: Self-pay | Admitting: Endocrinology

## 2019-06-07 ENCOUNTER — Other Ambulatory Visit: Payer: Self-pay

## 2019-06-07 ENCOUNTER — Other Ambulatory Visit (INDEPENDENT_AMBULATORY_CARE_PROVIDER_SITE_OTHER): Payer: Managed Care, Other (non HMO)

## 2019-06-07 DIAGNOSIS — E1169 Type 2 diabetes mellitus with other specified complication: Secondary | ICD-10-CM

## 2019-06-07 DIAGNOSIS — E669 Obesity, unspecified: Secondary | ICD-10-CM

## 2019-06-07 LAB — BASIC METABOLIC PANEL
BUN: 17 mg/dL (ref 6–23)
CO2: 27 mEq/L (ref 19–32)
Calcium: 9.1 mg/dL (ref 8.4–10.5)
Chloride: 104 mEq/L (ref 96–112)
Creatinine, Ser: 1.02 mg/dL (ref 0.40–1.50)
GFR: 87.98 mL/min (ref >60.00)
Glucose, Bld: 93 mg/dL (ref 70–99)
Potassium: 3.8 mEq/L (ref 3.5–5.1)
Sodium: 140 mEq/L (ref 135–145)

## 2019-06-07 LAB — HEMOGLOBIN A1C: Hgb A1c MFr Bld: 6.7 % — ABNORMAL HIGH (ref 4.6–6.5)

## 2019-06-09 ENCOUNTER — Other Ambulatory Visit: Payer: Self-pay

## 2019-06-10 ENCOUNTER — Ambulatory Visit (INDEPENDENT_AMBULATORY_CARE_PROVIDER_SITE_OTHER): Payer: Managed Care, Other (non HMO) | Admitting: Endocrinology

## 2019-06-10 ENCOUNTER — Encounter: Payer: Self-pay | Admitting: Endocrinology

## 2019-06-10 VITALS — BP 124/74 | HR 67 | Ht 64.0 in | Wt 184.4 lb

## 2019-06-10 DIAGNOSIS — E669 Obesity, unspecified: Secondary | ICD-10-CM

## 2019-06-10 DIAGNOSIS — E1169 Type 2 diabetes mellitus with other specified complication: Secondary | ICD-10-CM

## 2019-06-10 DIAGNOSIS — I10 Essential (primary) hypertension: Secondary | ICD-10-CM | POA: Diagnosis not present

## 2019-06-10 NOTE — Patient Instructions (Signed)
Exercise more  Check blood sugars on waking up 1-2 days a week  Also check blood sugars about 2 hours after meals and do this after different meals by rotation  Recommended blood sugar levels on waking up are 90-130 and about 2 hours after meal is 130-160  Please bring your blood sugar monitor to each visit, thank you

## 2019-06-10 NOTE — Progress Notes (Signed)
Patient ID: Mike Parker, male   DOB: 1950/04/28, 69 y.o.   MRN: [id number]           Reason for Appointment: Follow-up for Type 2 Diabetes     History of Present Illness:          Date of diagnosis of type 2 diabetes mellitus: ?  2011        Background history:   He appears to have been on glipizide and Janumet for several years with fair control His A1c had been over 7% since 2015 He was referred here because of an A1c of 9.2% done in 6/17  Recent history:   His A1c is 6.8 and stable  Fructosamine previously 229  Non-insulin hypoglycemic drugs: Synjardy 10/998,  metformin ER 500 mg PCS  Current management, blood sugar patterns and problems identified:   He has used the One Touch meter but currently this is not covered by his insurance  However checking blood sugars rather infrequently and only once or twice after meals  Most of his blood sugars are excellent however  He is gaining back most of the weight he has lost anything this is from an adequate exercise  Usually trying to watch portions  No side effects from Synjardy  Highest reading 163 at home  Glucose 193 fasting in the lab        Side effects from medications have been: Diarrhea from high-dose metformin  Compliance with the medical regimen: Fair  Glucose monitoring:  done 1 + times a day         Glucometer: One Touch Verio Blood Glucose readings by review of meter:   PRE-MEAL Fasting Lunch Dinner Bedtime Overall  Glucose range:  95-139   77    Mean/median:  125    118   POST-MEAL PC Breakfast PC Lunch PC Dinner  Glucose range:  105-117   94, 163  Mean/median:      Previous readings:  PRE-MEAL  midday Lunch Dinner Bedtime Overall  Glucose range:  111-160   127  117   Mean/median:        POST-MEAL PC Breakfast PC Lunch PC Dinner  Glucose range:  99, 165   Mean/median:        Self-care:  Meal times are:  Breakfast is skipped, lunch 12 noon, dinner usually 7-8 PM  Typical meal  intake: Breakfast is near noon egg or meat/ cereal.   Dinner is chicken or pork, greens, corn; snacks usually nuts, fruits            Dietician visit, most recent: 8/17                Weight history:  Wt Readings from Last 3 Encounters:  06/10/19 184 lb 6.4 oz (83.6 kg)  05/25/19 187 lb 12.8 oz (85.2 kg)  03/26/19 172 lb (78 kg)    Glycemic control:   Lab Results  Component Value Date   HGBA1C 6.7 (H) 06/07/2019   HGBA1C 6.8 (H) 02/04/2019   HGBA1C 6.7 (H) 09/18/2018   Lab Results  Component Value Date   MICROALBUR 3.1 (H) 02/04/2019   LDLCALC 24 03/23/2019   CREATININE 1.02 06/07/2019   Lab Results  Component Value Date   MICRALBCREAT 2.5 02/04/2019    Other problems discussed: See review of systems    Allergies as of 06/10/2019   No Known Allergies     Medication List       Accurate as of Jun 10, 2019  9:19 AM. If you have any questions, ask your nurse or doctor.        amLODipine 10 MG tablet Commonly known as: NORVASC TAKE 1 TABLET DAILY   aspirin 81 MG EC tablet Take 1 tablet (81 mg total) by mouth daily.   atorvastatin 10 MG tablet Commonly known as: LIPITOR TAKE 1 TABLET AT SUPPER FORLIPID LOWERING   blood glucose meter kit and supplies Dispense based on patient and insurance preference. Test twice daily; dx code: Z22.4   Bystolic 10 MG tablet Generic drug: nebivolol TAKE 1 TABLET DAILY   cloNIDine 0.1 MG tablet Commonly known as: CATAPRES TAKE 1 TABLET TWICE A DAY   econazole nitrate 1 % cream Apply topically daily.   fluticasone 50 MCG/ACT nasal spray Commonly known as: FLONASE USE TWO SPRAY(S) IN EACH NOSTRIL ONCE DAILY   freestyle lancets   furosemide 40 MG tablet Commonly known as: LASIX Take 0.5 tablets (20 mg total) by mouth daily.   losartan 100 MG tablet Commonly known as: COZAAR TAKE 1 TABLET DAILY   metFORMIN 500 MG 24 hr tablet Commonly known as: GLUCOPHAGE-XR TAKE 1 TABLET BY MOUTH ONCE DAILY WITH SUPPER     OneTouch Verio Flex System w/Device Kit 1 each by Does not apply route daily. Use onetouch verio flex to check blood sugar once daily.   OneTouch Verio test strip Generic drug: glucose blood USE TO CHECK BLOOD SUGAR ONCE A DAY   Synjardy XR 10-998 MG Tb24 Generic drug: Empagliflozin-metFORMIN HCl ER Take 1 tablet by mouth once daily with breakfast       Allergies: No Known Allergies  Past Medical History:  Diagnosis Date  . Allergy   . Diabetes mellitus   . Hypertension   . Insomnia   . LVH (left ventricular hypertrophy)   . Stroke (cerebrum) ([place]) 2014    Past Surgical History:  Procedure Laterality Date  . COLONOSCOPY    . ELBOW SURGERY     rt elbow  . HERNIA REPAIR     left inguinal  . KNEE SURGERY     rt and left    Family History  Problem Relation Age of Onset  . Stroke Mother   . Hypertension Brother   . Colon cancer Neg Hx   . Diabetes Neg Hx     Social History:  reports that he has never smoked. He has never used smokeless tobacco. He reports that he does not drink alcohol or use drugs.   Review of Systems   Lipid history: On treatment long-term with Lipitor 10 mg daily from PCP    Lab Results  Component Value Date   CHOL 74 03/23/2019   HDL 37 (L) 03/23/2019   LDLCALC 24 03/23/2019   TRIG 58 03/23/2019   CHOLHDL 2.0 03/23/2019           Hypertension:Treated for >15 years and followed by PCP  He will occasionally check his blood pressure at home, recent readings about 126/82 Also continue on 20 mg Lasix No change in renal function  BP Readings from Last 3 Encounters:  06/10/19 124/74  05/25/19 126/82  03/26/19 130/80   Lab Results  Component Value Date   CREATININE 1.02 06/07/2019   CREATININE 1.15 02/04/2019   CREATININE 1.02 09/18/2018     Most recent eye exam was documented in 12/2018  Most recent foot exam: 2/21  He has had his Covid vaccines  LABS:  Lab on 06/07/2019  Component Date Value Ref Range Status  .  Sodium 06/07/2019 140  135 - 145 mEq/L Final  . Potassium 06/07/2019 3.8  3.5 - 5.1 mEq/L Final  . Chloride 06/07/2019 104  96 - 112 mEq/L Final  . CO2 06/07/2019 27  19 - 32 mEq/L Final  . Glucose, Bld 06/07/2019 93  70 - 99 mg/dL Final  . BUN 06/07/2019 17  6 - 23 mg/dL Final  . Creatinine, Ser 06/07/2019 1.02  0.40 - 1.50 mg/dL Final  . GFR 06/07/2019 87.98  >60.00 mL/min Final  . Calcium 06/07/2019 9.1  8.4 - 10.5 mg/dL Final  . Hgb A1c MFr Bld 06/07/2019 6.7* 4.6 - 6.5 % Final   Glycemic Control Guidelines for People with Diabetes:Non Diabetic:  <6%Goal of Therapy: <7%Additional Action Suggested:  >8%     Physical Examination:  BP 124/74 (BP Location: Left Arm, Patient Position: Sitting, Cuff Size: Normal)   Pulse 67   Ht 5' 4"  (1.626 m)   Wt 184 lb 6.4 oz (83.6 kg)   SpO2 96%   BMI 31.65 kg/m        ASSESSMENT:  Diabetes type 2 on oral agents  See history of present illness for detailed discussion of current diabetes management, blood sugar patterns and problems identified  His A1c is stable at 6.7  He is on Synjardy and a total of 1500 mg of Metformin Although he has gained back weight he is recently starting to be more active and wants to try and work on his weight with the weather improving  Checking blood sugar somewhat infrequently Blood sugars at home are looking excellent with highest reading 163 recently  HYPERTENSION: Blood pressure controlled with current regimen including losartan No hyperkalemia with combining losartan and Jardiance  No recent edema  PLAN:  Continue Synjardy as above along with low-dose metformin He needs to check blood sugar more often after meals He can try to get his test trips covered by his Medicare insurance instead of [employer] To call if he has any persistently high readings, discussed fasting and postprandial targets    Follow-up in 4 months  There are no Patient Instructions on file for this visit.    Elayne Snare 06/10/2019, 9:19 AM   Note: This office note was prepared with Dragon voice recognition system technology. Any transcriptional errors that result from this process are unintentional.

## 2019-07-30 ENCOUNTER — Other Ambulatory Visit: Payer: Self-pay | Admitting: Physician Assistant

## 2019-09-01 ENCOUNTER — Other Ambulatory Visit: Payer: Self-pay | Admitting: Endocrinology

## 2019-09-20 ENCOUNTER — Other Ambulatory Visit: Payer: Managed Care, Other (non HMO) | Admitting: Internal Medicine

## 2019-09-20 ENCOUNTER — Other Ambulatory Visit: Payer: Self-pay

## 2019-09-20 DIAGNOSIS — Z0000 Encounter for general adult medical examination without abnormal findings: Secondary | ICD-10-CM

## 2019-09-20 DIAGNOSIS — E8881 Metabolic syndrome: Secondary | ICD-10-CM

## 2019-09-20 DIAGNOSIS — Z125 Encounter for screening for malignant neoplasm of prostate: Secondary | ICD-10-CM

## 2019-09-20 DIAGNOSIS — E786 Lipoprotein deficiency: Secondary | ICD-10-CM

## 2019-09-20 DIAGNOSIS — I10 Essential (primary) hypertension: Secondary | ICD-10-CM

## 2019-09-20 DIAGNOSIS — E1169 Type 2 diabetes mellitus with other specified complication: Secondary | ICD-10-CM

## 2019-09-21 LAB — CBC WITH DIFFERENTIAL/PLATELET
Absolute Monocytes: 666 cells/uL (ref 200–950)
Basophils Absolute: 42 cells/uL (ref 0–200)
Basophils Relative: 0.7 %
Eosinophils Absolute: 258 cells/uL (ref 15–500)
Eosinophils Relative: 4.3 %
HCT: 46.2 % (ref 38.5–50.0)
Hemoglobin: 14.9 g/dL (ref 13.2–17.1)
Lymphs Abs: 1266 cells/uL (ref 850–3900)
MCH: 26.7 pg — ABNORMAL LOW (ref 27.0–33.0)
MCHC: 32.3 g/dL (ref 32.0–36.0)
MCV: 82.6 fL (ref 80.0–100.0)
MPV: 10.8 fL (ref 7.5–12.5)
Monocytes Relative: 11.1 %
Neutro Abs: 3768 cells/uL (ref 1500–7800)
Neutrophils Relative %: 62.8 %
Platelets: 214 10*3/uL (ref 140–400)
RBC: 5.59 10*6/uL (ref 4.20–5.80)
RDW: 13.7 % (ref 11.0–15.0)
Total Lymphocyte: 21.1 %
WBC: 6 10*3/uL (ref 3.8–10.8)

## 2019-09-21 LAB — COMPLETE METABOLIC PANEL WITH GFR
AG Ratio: 1.9 (calc) (ref 1.0–2.5)
ALT: 15 U/L (ref 9–46)
AST: 15 U/L (ref 10–35)
Albumin: 4.1 g/dL (ref 3.6–5.1)
Alkaline phosphatase (APISO): 65 U/L (ref 35–144)
BUN: 16 mg/dL (ref 7–25)
CO2: 27 mmol/L (ref 20–32)
Calcium: 9 mg/dL (ref 8.6–10.3)
Chloride: 103 mmol/L (ref 98–110)
Creat: 0.95 mg/dL (ref 0.70–1.25)
GFR, Est African American: 96 mL/min/{1.73_m2} (ref >=60)
GFR, Est Non African American: 82 mL/min/{1.73_m2} (ref >=60)
Globulin: 2.2 g/dL (calc) (ref 1.9–3.7)
Glucose, Bld: 77 mg/dL (ref 65–99)
Potassium: 4 mmol/L (ref 3.5–5.3)
Sodium: 139 mmol/L (ref 135–146)
Total Bilirubin: 1.2 mg/dL (ref 0.2–1.2)
Total Protein: 6.3 g/dL (ref 6.1–8.1)

## 2019-09-21 LAB — LIPID PANEL
Cholesterol: 75 mg/dL (ref <200)
HDL: 38 mg/dL — ABNORMAL LOW (ref >=40)
LDL Cholesterol (Calc): 23 mg/dL (calc)
Non-HDL Cholesterol (Calc): 37 mg/dL (calc) (ref <130)
Total CHOL/HDL Ratio: 2 (calc) (ref <5.0)
Triglycerides: 67 mg/dL (ref <150)

## 2019-09-21 LAB — PSA: PSA: 1.5 ng/mL (ref <=4.0)

## 2019-09-21 LAB — HEMOGLOBIN A1C
Hgb A1c MFr Bld: 6.8 % of total Hgb — ABNORMAL HIGH (ref <5.7)
Mean Plasma Glucose: 148 (calc)
eAG (mmol/L): 8.2 (calc)

## 2019-09-27 ENCOUNTER — Ambulatory Visit (INDEPENDENT_AMBULATORY_CARE_PROVIDER_SITE_OTHER): Payer: Managed Care, Other (non HMO) | Admitting: Internal Medicine

## 2019-09-27 ENCOUNTER — Encounter: Payer: Self-pay | Admitting: Internal Medicine

## 2019-09-27 ENCOUNTER — Other Ambulatory Visit: Payer: Self-pay

## 2019-09-27 VITALS — BP 122/80 | HR 84 | Temp 98.0°F | Wt 184.0 lb

## 2019-09-27 DIAGNOSIS — I10 Essential (primary) hypertension: Secondary | ICD-10-CM

## 2019-09-27 DIAGNOSIS — E1169 Type 2 diabetes mellitus with other specified complication: Secondary | ICD-10-CM | POA: Diagnosis not present

## 2019-09-27 DIAGNOSIS — E786 Lipoprotein deficiency: Secondary | ICD-10-CM | POA: Diagnosis not present

## 2019-09-27 DIAGNOSIS — E785 Hyperlipidemia, unspecified: Secondary | ICD-10-CM

## 2019-09-27 DIAGNOSIS — Z23 Encounter for immunization: Secondary | ICD-10-CM

## 2019-09-27 DIAGNOSIS — Z0000 Encounter for general adult medical examination without abnormal findings: Secondary | ICD-10-CM

## 2019-09-27 DIAGNOSIS — Z8673 Personal history of transient ischemic attack (TIA), and cerebral infarction without residual deficits: Secondary | ICD-10-CM

## 2019-09-27 LAB — POCT URINALYSIS DIPSTICK
Appearance: NEGATIVE
Bilirubin, UA: NEGATIVE
Blood, UA: NEGATIVE
Glucose, UA: POSITIVE — AB
Ketones, UA: NEGATIVE
Leukocytes, UA: NEGATIVE
Nitrite, UA: NEGATIVE
Odor: NEGATIVE
Protein, UA: NEGATIVE
Spec Grav, UA: 1.01 (ref 1.010–1.025)
Urobilinogen, UA: 0.2 E.U./dL
pH, UA: 6.5 (ref 5.0–8.0)

## 2019-09-27 NOTE — Progress Notes (Signed)
Subjective:    Patient ID: Mike Parker, male    DOB: 05-18-1950, 69 y.o.   MRN: [id number]  HPI  69 year old Male for health maintenance exam, Medicare wellness, and evaluation of medical issues.  Patient is enjoying retirement.  He and his wife now live in the [place] area.  Patient's mother-in-law who resides with him.  Patient has a history of diabetes mellitus which is under good control with the assistance of Dr. Lucianne Muss.  Patient has a history of essential hypertension, hyperlipidemia, metabolic syndrome and obesity.  He had a left thalamic CVA in June 2014.  2D echocardiogram at that time showed moderate LVH and diastolic dysfunction.  Renal artery duplex scan was negative for renal artery stenosis.  He recovered from his stroke but still has some paresthesias in his right hand.  He takes Ativan for insomnia which started after his stroke.  The stroke frightened him.  His mother died of a stroke and it was disconcerting to him to have a stroke as well.  He was evaluated for sleep apnea several years ago.  Dr. Stann Mainland thought he just needed to lose weight and he has lost some weight.  He had a frozen shoulder previously treated by Dr. Teressa Senter and resolved several years ago.  No known drug allergies.  Social history: He is married.  He is retired from the [employer] where he worked for many years.  2 adult daughters and 2 grandchildren.  Does not smoke or consume alcohol.  Wife continues to work from home during the pandemic for [employer].  1 daughter resides in [place].  Family history: Mother died of a stroke.  2 daughters in good health.   Review of Systems  Constitutional: Negative.   Respiratory: Negative.   Cardiovascular: Negative.   Genitourinary: Negative.   Musculoskeletal: Negative.   Psychiatric/Behavioral: Negative.        Objective:   Physical Exam Vitals reviewed.  Constitutional:      Appearance: Normal appearance. He is not diaphoretic.  HENT:      Head: Normocephalic and atraumatic.     Right Ear: Tympanic membrane normal.     Left Ear: Tympanic membrane normal.     Nose: Nose normal.     Mouth/Throat:     Mouth: Mucous membranes are moist.     Pharynx: Oropharynx is clear.  Eyes:     General: No scleral icterus.    Extraocular Movements: Extraocular movements intact.     Conjunctiva/sclera: Conjunctivae normal.     Pupils: Pupils are equal, round, and reactive to light.  Neck:     Vascular: No carotid bruit.  Cardiovascular:     Rate and Rhythm: Normal rate and regular rhythm.     Pulses: Normal pulses.     Heart sounds: Normal heart sounds. No murmur heard.   Pulmonary:     Effort: Pulmonary effort is normal.     Breath sounds: Normal breath sounds. No wheezing or rales.  Abdominal:     General: Bowel sounds are normal. There is no distension.     Palpations: Abdomen is soft. There is no mass.     Tenderness: There is no guarding or rebound.  Genitourinary:    Prostate: Normal.  Musculoskeletal:     Cervical back: Neck supple. No rigidity.     Right lower leg: No edema.     Left lower leg: No edema.  Lymphadenopathy:     Cervical: No cervical adenopathy.  Skin:  General: Skin is warm and dry.  Neurological:     General: No focal deficit present.     Mental Status: He is alert and oriented to person, place, and time.     Cranial Nerves: No cranial nerve deficit.     Motor: No weakness.  Psychiatric:        Mood and Affect: Mood normal.        Behavior: Behavior normal.        Thought Content: Thought content normal.        Judgment: Judgment normal.           Assessment & Plan:  Essential hypertension stable on 5 drug regimen consisting of amlodipine, Bystolic, clonidine, losartan and furosemide.  Type 2 diabetes mellitus stable.  Dr. Lucianne Muss recently started him on Synjardy XR 10-998 daily  Metabolic syndrome  BMI 31.58-continue to try to get BMI less than 30 with diet and  exercise.  Hyperlipidemia treated with atorvastatin and lipid panel is stable.  However he has a low HDL of 38.  History of left thalamic CVA with resultant paresthesia of hand but no weakness  Health maintenance: He is sent Prevnar 13 and pneumococcal 23.  We only have 1 COVID-19 vaccine documented from February 2021.  He needs to provide [place] with both dates.  Gets annual flu vaccine in 1 was given today.  Consider Shingrix in the near future and up-to-date on tetanus immunization which was last given in 2011.  Patient had colonoscopy in 2017 which was normal with 10-year follow-up recommended.  Plan: Patient will return to see me again in 6 months.  He will have fasting labs including hemoglobin A1c and lipid panel liver functions at that time.  Subjective:   Patient presents for Medicare Annual/Subsequent preventive examination.  Review Past Medical/Family/Social: See above  Risk Factors  Current exercise habits: Walks and spends time with grandchildren Dietary issues discussed: Low-fat low carbohydrate  Cardiac risk factors: Family history of stroke in mother, history of stroke, hypertension diabetes and hyperlipidemia  Depression Screen  (Note: if answer to either of the following is "Yes", a more complete depression screening is indicated)   Over the past two weeks, have you felt down, depressed or hopeless? No  Over the past two weeks, have you felt little interest or pleasure in doing things? No Have you lost interest or pleasure in daily life? No Do you often feel hopeless? No Do you cry easily over simple problems? No   Activities of Daily Living  In your present state of health, do you have any difficulty performing the following activities?:   Driving? No  Managing money? No  Feeding yourself? No  Getting from bed to chair? No  Climbing a flight of stairs? No  Preparing food and eating?: No  Bathing or showering? No  Getting dressed: No  Getting to the toilet? No   Using the toilet:No  Moving around from place to place: No  In the past year have you fallen or had a near fall?:No  Are you sexually active?  Yes Do you have more than one partner? No   Hearing Difficulties: No  Do you often ask people to speak up or repeat themselves? No  Do you experience ringing or noises in your ears? No  Do you have difficulty understanding soft or whispered voices? No  Do you feel that you have a problem with memory? No Do you often misplace items? No    Home Safety:  Do  you have a smoke alarm at your residence? Yes Do you have grab bars in the bathroom?  None Do you have throw rugs in your house?  Yes   Cognitive Testing  Alert? Yes Normal Appearance?Yes  Oriented to person? Yes Place? Yes  Time? Yes  Recall of three objects? Yes  Can perform simple calculations? Yes  Displays appropriate judgment?Yes  Can read the correct time from a watch face?Yes   List the Names of Other Physician/Practitioners you currently use:  See referral list for the physicians patient is currently seeing.  Dr. Lucianne Muss   Review of Systems: See above   Objective:     General appearance: Appears stated age and mildly obese  Head: Normocephalic, without obvious abnormality, atraumatic  Eyes: conj clear, EOMi PEERLA  Ears: normal TM's and external ear canals both ears  Nose: Nares normal. Septum midline. Mucosa normal. No drainage or sinus tenderness.  Throat: lips, mucosa, and tongue normal; teeth and gums normal  Neck: no adenopathy, no carotid bruit, no JVD, supple, symmetrical, trachea midline and thyroid not enlarged, symmetric, no tenderness/mass/nodules  No CVA tenderness.  Lungs: clear to auscultation bilaterally  Breasts: normal appearance, no masses or tenderness Heart: regular rate and rhythm, S1, S2 normal, no murmur, click, rub or gallop  Abdomen: soft, non-tender; bowel sounds normal; no masses, no organomegaly  Musculoskeletal: ROM normal in all joints,  no crepitus, no deformity, Normal muscle strengthen. Back  is symmetric, no curvature. Skin: Skin color, texture, turgor normal. No rashes or lesions  Lymph nodes: Cervical, supraclavicular, and axillary nodes normal.  Neurologic: CN 2 -12 Normal, Normal symmetric reflexes. Normal coordination and gait  Psych: Alert & Oriented x 3, Mood appear stable.    Assessment:    Annual wellness medicare exam   Plan:    During the course of the visit the patient was educated and counseled about appropriate screening and preventive services including:   Annual flu vaccine  He will need COVID-19 booster when approved     Patient Instructions (the written plan) was given to the patient.  Medicare Attestation  I have personally reviewed:  The patient's medical and social history  Their use of alcohol, tobacco or illicit drugs  Their current medications and supplements  The patient's functional ability including ADLs,fall risks, home safety risks, cognitive, and hearing and visual impairment  Diet and physical activities  Evidence for depression or mood disorders  The patient's weight, height, BMI, and visual acuity have been recorded in the chart. I have made referrals, counseling, and provided education to the patient based on review of the above and I have provided the patient with a written personalized care plan for preventive services.

## 2019-10-12 ENCOUNTER — Other Ambulatory Visit: Payer: Self-pay

## 2019-10-12 ENCOUNTER — Other Ambulatory Visit (INDEPENDENT_AMBULATORY_CARE_PROVIDER_SITE_OTHER): Payer: Federal, State, Local not specified - PPO

## 2019-10-12 DIAGNOSIS — E1169 Type 2 diabetes mellitus with other specified complication: Secondary | ICD-10-CM | POA: Diagnosis not present

## 2019-10-12 DIAGNOSIS — E669 Obesity, unspecified: Secondary | ICD-10-CM | POA: Diagnosis not present

## 2019-10-12 LAB — BASIC METABOLIC PANEL
BUN: 16 mg/dL (ref 6–23)
CO2: 27 mEq/L (ref 19–32)
Calcium: 9.4 mg/dL (ref 8.4–10.5)
Chloride: 102 mEq/L (ref 96–112)
Creatinine, Ser: 1.16 mg/dL (ref 0.40–1.50)
GFR: 75.77 mL/min (ref >60.00)
Glucose, Bld: 105 mg/dL — ABNORMAL HIGH (ref 70–99)
Potassium: 3.5 mEq/L (ref 3.5–5.1)
Sodium: 139 mEq/L (ref 135–145)

## 2019-10-12 LAB — HEMOGLOBIN A1C: Hgb A1c MFr Bld: 6.9 % — ABNORMAL HIGH (ref 4.6–6.5)

## 2019-10-14 ENCOUNTER — Encounter: Payer: Self-pay | Admitting: Endocrinology

## 2019-10-14 ENCOUNTER — Other Ambulatory Visit: Payer: Self-pay

## 2019-10-14 ENCOUNTER — Ambulatory Visit (INDEPENDENT_AMBULATORY_CARE_PROVIDER_SITE_OTHER): Payer: Medicare Other | Admitting: Endocrinology

## 2019-10-14 VITALS — BP 130/66 | HR 65 | Ht 64.0 in | Wt 181.4 lb

## 2019-10-14 DIAGNOSIS — E669 Obesity, unspecified: Secondary | ICD-10-CM | POA: Diagnosis not present

## 2019-10-14 DIAGNOSIS — E1169 Type 2 diabetes mellitus with other specified complication: Secondary | ICD-10-CM

## 2019-10-14 NOTE — Progress Notes (Signed)
Patient ID: Mike Parker, male   DOB: 09/27/1950, 69 y.o.   MRN: [id number]           Reason for Appointment: Follow-up for Type 2 Diabetes     History of Present Illness:          Date of diagnosis of type 2 diabetes mellitus: ?  2011        Background history:   He appears to have been on glipizide and Janumet for several years with fair control His A1c had been over 7% since 2015 He was referred here because of an A1c of 9.2% done in 6/17  Recent history:   His A1c is 6.9 compared to 6.8, overall stable  Fructosamine previously 229  Non-insulin hypoglycemic drugs: Synjardy 10/998,  metformin ER 500 mg PCS  Current management, blood sugar patterns and problems identified:   He has checked his blood sugars more consistently especially after meals  Although he is still using the One Touch meter he says his cost is $50 for the strips despite having Medicare and not clear why  Lab fasting glucose was 105  Highest blood sugar at home is 175 after breakfast  He says that his wife is cooking breakfast now since he is retired; also because of this he may be eating differently  Also sometimes getting snacks late at night  He has been avoiding going out because of the pandemic and has not done much walking  However weight is down 3 pounds  He is tolerating Synjardy without any side effects and renal function is stable   Side effects from medications have been: Diarrhea from high-dose metformin  Compliance with the medical regimen: Fair  Glucose monitoring:  done 1 + times a day         Glucometer: One Touch Verio  Blood Glucose readings by review of meter:   PRE-MEAL Fasting Lunch Dinner Bedtime Overall  Glucose range:  129    143   Mean/median:      126   POST-MEAL PC Breakfast PC Lunch PC Dinner  Glucose range:  110-175  94-127   Mean/median:  143  109    Previous readings:  PRE-MEAL Fasting Lunch Dinner Bedtime Overall  Glucose range:  95-139   77     Mean/median:  125    118   POST-MEAL PC Breakfast PC Lunch PC Dinner  Glucose range:  105-117   94, 163  Mean/median:         Self-care:  Meal times are:  Breakfast is skipped, lunch 12 noon, dinner usually 7-8 PM  Typical meal intake: Breakfast is near noon egg or meat/ cereal.   Dinner is chicken or pork, greens, corn; snacks usually nuts, fruits            Dietician visit, most recent: 8/17                Weight history:  Wt Readings from Last 3 Encounters:  10/14/19 181 lb 6.4 oz (82.3 kg)  09/27/19 184 lb (83.5 kg)  06/10/19 184 lb 6.4 oz (83.6 kg)    Glycemic control:   Lab Results  Component Value Date   HGBA1C 6.9 (H) 10/12/2019   HGBA1C 6.8 (H) 09/20/2019   HGBA1C 6.7 (H) 06/07/2019   Lab Results  Component Value Date   MICROALBUR 3.1 (H) 02/04/2019   LDLCALC 23 09/20/2019   CREATININE 1.16 10/12/2019   Lab Results  Component Value Date   MICRALBCREAT 2.5 02/04/2019  Other problems discussed: See review of systems    Allergies as of 10/14/2019   No Known Allergies     Medication List       Accurate as of October 14, 2019  9:34 AM. If you have any questions, ask your nurse or doctor.        amLODipine 10 MG tablet Commonly known as: NORVASC TAKE 1 TABLET DAILY   aspirin 81 MG EC tablet Take 1 tablet (81 mg total) by mouth daily.   atorvastatin 10 MG tablet Commonly known as: LIPITOR TAKE 1 TABLET AT SUPPER FORLIPID LOWERING   blood glucose meter kit and supplies Dispense based on patient and insurance preference. Test twice daily; dx code: T53.2   Bystolic 10 MG tablet Generic drug: nebivolol TAKE 1 TABLET DAILY   cloNIDine 0.1 MG tablet Commonly known as: CATAPRES TAKE 1 TABLET TWICE A DAY   econazole nitrate 1 % cream Apply topically daily.   fluticasone 50 MCG/ACT nasal spray Commonly known as: FLONASE USE TWO SPRAY(S) IN EACH NOSTRIL ONCE DAILY   freestyle lancets   furosemide 40 MG tablet Commonly known as:  LASIX Take 0.5 tablets (20 mg total) by mouth daily.   losartan 100 MG tablet Commonly known as: COZAAR TAKE 1 TABLET DAILY   metFORMIN 500 MG 24 hr tablet Commonly known as: GLUCOPHAGE-XR TAKE 1 TABLET BY MOUTH ONCE DAILY WITH SUPPER   OneTouch Verio Flex System w/Device Kit 1 each by Does not apply route daily. Use onetouch verio flex to check blood sugar once daily.   OneTouch Verio test strip Generic drug: glucose blood USE TO CHECK BLOOD SUGAR ONCE A DAY   Synjardy XR 10-998 MG Tb24 Generic drug: Empagliflozin-metFORMIN HCl ER Take 1 tablet by mouth once daily with breakfast       Allergies: No Known Allergies  Past Medical History:  Diagnosis Date  . Allergy   . Diabetes mellitus   . Hypertension   . Insomnia   . LVH (left ventricular hypertrophy)   . Stroke (cerebrum) ([place]) 2014    Past Surgical History:  Procedure Laterality Date  . COLONOSCOPY    . ELBOW SURGERY     rt elbow  . HERNIA REPAIR     left inguinal  . KNEE SURGERY     rt and left    Family History  Problem Relation Age of Onset  . Stroke Mother   . Hypertension Brother   . Colon cancer Neg Hx   . Diabetes Neg Hx     Social History:  reports that he has never smoked. He has never used smokeless tobacco. He reports that he does not drink alcohol and does not use drugs.   Review of Systems   Lipid history: On treatment long-term with Lipitor 10 mg daily from PCP    Lab Results  Component Value Date   CHOL 75 09/20/2019   HDL 38 (L) 09/20/2019   LDLCALC 23 09/20/2019   TRIG 67 09/20/2019   CHOLHDL 2.0 09/20/2019           Hypertension:Treated for >15 years and followed by PCP  He will occasionally check his blood pressure at home  Also on 20 mg Lasix No change in renal function  BP Readings from Last 3 Encounters:  10/14/19 130/66  06/10/19 124/74  05/25/19 126/82   Lab Results  Component Value Date   CREATININE 1.16 10/12/2019   CREATININE 0.95 09/20/2019    CREATININE 1.02 06/07/2019  Most recent eye exam was documented in 12/2018  Most recent foot exam: 2/21  He has had his Covid vaccines  LABS:  Lab on 10/12/2019  Component Date Value Ref Range Status  . Sodium 10/12/2019 139  135 - 145 mEq/L Final  . Potassium 10/12/2019 3.5  3.5 - 5.1 mEq/L Final  . Chloride 10/12/2019 102  96 - 112 mEq/L Final  . CO2 10/12/2019 27  19 - 32 mEq/L Final  . Glucose, Bld 10/12/2019 105* 70 - 99 mg/dL Final  . BUN 10/12/2019 16  6 - 23 mg/dL Final  . Creatinine, Ser 10/12/2019 1.16  0.40 - 1.50 mg/dL Final  . GFR 10/12/2019 75.77  >60.00 mL/min Final  . Calcium 10/12/2019 9.4  8.4 - 10.5 mg/dL Final  . Hgb A1c MFr Bld 10/12/2019 6.9* 4.6 - 6.5 % Final   Glycemic Control Guidelines for People with Diabetes:Non Diabetic:  <6%Goal of Therapy: <7%Additional Action Suggested:  >8%     Physical Examination:  BP 130/66 (BP Location: Left Arm, Patient Position: Sitting, Cuff Size: Normal)   Pulse 65   Ht 5' 4"  (1.626 m)   Wt 181 lb 6.4 oz (82.3 kg)   SpO2 95%   BMI 31.14 kg/m        ASSESSMENT:  Diabetes type 2 on oral agents  See history of present illness for detailed discussion of current diabetes management, blood sugar patterns and problems identified  His A1c is stable at 6.9  He is on Synjardy 10/998 with a total of 1500 mg of Metformin He has lost some of the weight that he had gained previously However he can still get back into his walking and regular routine for exercise  Most of his blood sugars after meals are fairly good now and monitoring after meals likely is helping his diet  HYPERTENSION: Blood pressure controlled with current regimen including losartan  Renal function fairly stable  PLAN:  No change in his medications as above He can try to get the freestyle neo meter if this is less expensive otherwise he will check with his [facility] about the preferred meter Recommended starting a walking  program   Follow-up in 4 months  There are no Patient Instructions on file for this visit.    Elayne Snare 10/14/2019, 9:34 AM   Note: This office note was prepared with Dragon voice recognition system technology. Any transcriptional errors that result from this process are unintentional.

## 2019-10-14 NOTE — Patient Instructions (Signed)
Walk daily  Check Freestyle neo strips

## 2019-10-16 ENCOUNTER — Encounter: Payer: Self-pay | Admitting: Internal Medicine

## 2019-10-16 NOTE — Patient Instructions (Addendum)
It was a pleasure to see you today.  Continue current medications.  Continue to walk and try to lose weight.  Follow-up in 6 months.  Flu vaccine given today.  Have COVID-19 booster when approved.

## 2019-11-09 DIAGNOSIS — Z4802 Encounter for removal of sutures: Secondary | ICD-10-CM | POA: Diagnosis not present

## 2019-11-11 ENCOUNTER — Other Ambulatory Visit: Payer: Self-pay | Admitting: Internal Medicine

## 2019-11-12 ENCOUNTER — Other Ambulatory Visit: Payer: Self-pay | Admitting: Internal Medicine

## 2019-11-29 ENCOUNTER — Other Ambulatory Visit: Payer: Self-pay | Admitting: Endocrinology

## 2019-11-30 ENCOUNTER — Other Ambulatory Visit: Payer: Self-pay | Admitting: Internal Medicine

## 2019-12-01 ENCOUNTER — Other Ambulatory Visit: Payer: Self-pay | Admitting: Internal Medicine

## 2020-01-19 LAB — HM DIABETES EYE EXAM

## 2020-01-26 ENCOUNTER — Encounter: Payer: Self-pay | Admitting: Internal Medicine

## 2020-02-15 ENCOUNTER — Other Ambulatory Visit: Payer: Medicare Other

## 2020-02-18 ENCOUNTER — Ambulatory Visit: Payer: Medicare Other | Admitting: Endocrinology

## 2020-02-23 ENCOUNTER — Other Ambulatory Visit: Payer: Self-pay

## 2020-02-23 ENCOUNTER — Other Ambulatory Visit (INDEPENDENT_AMBULATORY_CARE_PROVIDER_SITE_OTHER): Payer: Medicare Other

## 2020-02-23 DIAGNOSIS — E669 Obesity, unspecified: Secondary | ICD-10-CM | POA: Diagnosis not present

## 2020-02-23 DIAGNOSIS — E1169 Type 2 diabetes mellitus with other specified complication: Secondary | ICD-10-CM

## 2020-02-23 LAB — BASIC METABOLIC PANEL
BUN: 14 mg/dL (ref 6–23)
CO2: 29 mEq/L (ref 19–32)
Calcium: 9.7 mg/dL (ref 8.4–10.5)
Chloride: 103 mEq/L (ref 96–112)
Creatinine, Ser: 1.05 mg/dL (ref 0.40–1.50)
GFR: 73.03 mL/min (ref >60.00)
Glucose, Bld: 107 mg/dL — ABNORMAL HIGH (ref 70–99)
Potassium: 4.2 mEq/L (ref 3.5–5.1)
Sodium: 139 mEq/L (ref 135–145)

## 2020-02-23 LAB — MICROALBUMIN / CREATININE URINE RATIO
Creatinine,U: 16.4 mg/dL
Microalb Creat Ratio: 4.3 mg/g (ref 0.0–30.0)
Microalb, Ur: <0.7 mg/dL (ref 0.0–1.9)

## 2020-02-23 LAB — HEMOGLOBIN A1C: Hgb A1c MFr Bld: 7.2 % — ABNORMAL HIGH (ref 4.6–6.5)

## 2020-02-29 ENCOUNTER — Other Ambulatory Visit: Payer: Self-pay

## 2020-02-29 ENCOUNTER — Encounter: Payer: Self-pay | Admitting: Endocrinology

## 2020-02-29 ENCOUNTER — Ambulatory Visit (INDEPENDENT_AMBULATORY_CARE_PROVIDER_SITE_OTHER): Payer: Medicare Other | Admitting: Endocrinology

## 2020-02-29 ENCOUNTER — Other Ambulatory Visit: Payer: Self-pay | Admitting: Endocrinology

## 2020-02-29 VITALS — BP 124/72 | HR 70 | Ht 65.0 in | Wt 183.4 lb

## 2020-02-29 DIAGNOSIS — E669 Obesity, unspecified: Secondary | ICD-10-CM

## 2020-02-29 DIAGNOSIS — E1169 Type 2 diabetes mellitus with other specified complication: Secondary | ICD-10-CM

## 2020-02-29 NOTE — Patient Instructions (Signed)
Check blood sugars on waking up 2-3  days a week ? ?Also check blood sugars about 2 hours after meals and do this after different meals by rotation ? ?Recommended blood sugar levels on waking up are 90-130 and about 2 hours after meal is 130-160 ? ?Please bring your blood sugar monitor to each visit, thank you ? ?Walk daily ?

## 2020-02-29 NOTE — Progress Notes (Signed)
Patient ID: Mike Parker, male   DOB: 1950/02/28, 70 y.o.   MRN: [id number]           Reason for Appointment: Follow-up for Type 2 Diabetes     History of Present Illness:          Date of diagnosis of type 2 diabetes mellitus: ?  2011        Background history:   He appears to have been on glipizide and Janumet for several years with fair control His A1c had been over 7% since 2015 He was referred here because of an A1c of 9.2% done in 6/17  Recent history:   His A1c is 7.2 compared to 6.9  Fructosamine previously 229  Non-insulin hypoglycemic drugs: Synjardy 10/998,  metformin ER 500 mg PCS  Current management, blood sugar patterns and problems identified:   He has checked his blood sugars at different times of the day  He says that he is not able to be consistent with diet especially in December because of stress eating  However more recently his blood sugars are averaging only 126 and only rarely higher normal after dinner  Is doing some walking but not consistently  Has been on the same medication regimen for some time without side effects  Weight is up 2 pounds   Side effects from medications have been: Diarrhea from high-dose metformin  Compliance with the medical regimen: Fair  Glucose monitoring:  done 1 + times a day         Glucometer: One Touch Verio  Blood Glucose readings by review of meter:   PRE-MEAL Fasting Lunch Dinner Bedtime Overall  Glucose range:     104-177 79-177  Mean/median:  118  118  117  133 126   Previous readings:  PRE-MEAL Fasting Lunch Dinner Bedtime Overall  Glucose range:  129    143   Mean/median:      126   POST-MEAL PC Breakfast PC Lunch PC Dinner  Glucose range:  110-175  94-127   Mean/median:  143  109        Self-care:  Meal times are:  Breakfast is skipped, lunch 12 noon, dinner usually 7-8 PM  Typical meal intake: Breakfast is near noon egg or meat/ cereal.   Dinner is chicken or pork, greens, corn;  snacks usually nuts, fruits            Dietician visit, most recent: 8/17                Weight history:  Wt Readings from Last 3 Encounters:  02/29/20 183 lb 6.4 oz (83.2 kg)  10/14/19 181 lb 6.4 oz (82.3 kg)  09/27/19 184 lb (83.5 kg)    Glycemic control:   Lab Results  Component Value Date   HGBA1C 7.2 (H) 02/23/2020   HGBA1C 6.9 (H) 10/12/2019   HGBA1C 6.8 (H) 09/20/2019   Lab Results  Component Value Date   MICROALBUR <0.7 02/23/2020   LDLCALC 23 09/20/2019   CREATININE 1.05 02/23/2020   Lab Results  Component Value Date   MICRALBCREAT 4.3 02/23/2020    Other problems discussed: See review of systems    Allergies as of 02/29/2020   No Known Allergies     Medication List       Accurate as of February 29, 2020  9:07 AM. If you have any questions, ask your nurse or doctor.        amLODipine 10 MG tablet Commonly known as:  NORVASC TAKE 1 TABLET DAILY   aspirin 81 MG EC tablet Take 1 tablet (81 mg total) by mouth daily.   atorvastatin 10 MG tablet Commonly known as: LIPITOR TAKE 1 TABLET AT SUPPER FORLIPID LOWERING   blood glucose meter kit and supplies Dispense based on patient and insurance preference. Test twice daily; dx code: X09.4   Bystolic 10 MG tablet Generic drug: nebivolol TAKE 1 TABLET DAILY   cloNIDine 0.1 MG tablet Commonly known as: CATAPRES TAKE 1 TABLET TWICE A DAY   econazole nitrate 1 % cream Apply topically daily.   fluticasone 50 MCG/ACT nasal spray Commonly known as: FLONASE USE TWO SPRAY(S) IN EACH NOSTRIL ONCE DAILY   freestyle lancets   furosemide 40 MG tablet Commonly known as: LASIX Take 0.5 tablets (20 mg total) by mouth daily.   losartan 100 MG tablet Commonly known as: COZAAR TAKE 1 TABLET DAILY   metFORMIN 500 MG 24 hr tablet Commonly known as: GLUCOPHAGE-XR TAKE 1 TABLET BY MOUTH ONCE DAILY WITH SUPPER   OneTouch Verio Flex System w/Device Kit 1 each by Does not apply route daily. Use onetouch  verio flex to check blood sugar once daily.   OneTouch Verio test strip Generic drug: glucose blood USE TO CHECK BLOOD SUGAR ONCE A DAY   Synjardy XR 10-998 MG Tb24 Generic drug: Empagliflozin-metFORMIN HCl ER Take 1 tablet by mouth once daily with breakfast       Allergies: No Known Allergies  Past Medical History:  Diagnosis Date  . Allergy   . Diabetes mellitus   . Hypertension   . Insomnia   . LVH (left ventricular hypertrophy)   . Stroke (cerebrum) ([place]) 2014    Past Surgical History:  Procedure Laterality Date  . COLONOSCOPY    . ELBOW SURGERY     rt elbow  . HERNIA REPAIR     left inguinal  . KNEE SURGERY     rt and left    Family History  Problem Relation Age of Onset  . Stroke Mother   . Hypertension Brother   . Colon cancer Neg Hx   . Diabetes Neg Hx     Social History:  reports that he has never smoked. He has never used smokeless tobacco. He reports that he does not drink alcohol and does not use drugs.   Review of Systems   Lipid history: On treatment long-term with Lipitor 10 mg daily from PCP    Lab Results  Component Value Date   CHOL 75 09/20/2019   HDL 38 (L) 09/20/2019   LDLCALC 23 09/20/2019   TRIG 67 09/20/2019   CHOLHDL 2.0 09/20/2019           Hypertension:Treated for >15 years and followed by PCP  He checks his blood pressure at home  Also on 20 mg Lasix No change in renal function  BP Readings from Last 3 Encounters:  02/29/20 124/72  10/14/19 130/66  09/27/19 122/80   Lab Results  Component Value Date   CREATININE 1.05 02/23/2020   CREATININE 1.16 10/12/2019   CREATININE 0.95 09/20/2019     Most recent eye exam was documented in 12/21  Most recent foot exam: 2/21  He has had his Covid vaccines  LABS:  Lab on 02/23/2020  Component Date Value Ref Range Status  . Microalb, Ur 02/23/2020 <0.7  0.0 - 1.9 mg/dL Final  . Creatinine,U 02/23/2020 16.4  mg/dL Final  . Microalb Creat Ratio 02/23/2020 4.3  0.0  - 30.0 mg/g  Final  . Sodium 02/23/2020 139  135 - 145 mEq/L Final  . Potassium 02/23/2020 4.2  3.5 - 5.1 mEq/L Final  . Chloride 02/23/2020 103  96 - 112 mEq/L Final  . CO2 02/23/2020 29  19 - 32 mEq/L Final  . Glucose, Bld 02/23/2020 107* 70 - 99 mg/dL Final  . BUN 02/23/2020 14  6 - 23 mg/dL Final  . Creatinine, Ser 02/23/2020 1.05  0.40 - 1.50 mg/dL Final  . GFR 02/23/2020 73.03  >60.00 mL/min Final   Calculated using the CKD-EPI Creatinine Equation (2021)  . Calcium 02/23/2020 9.7  8.4 - 10.5 mg/dL Final  . Hgb A1c MFr Bld 02/23/2020 7.2* 4.6 - 6.5 % Final   Glycemic Control Guidelines for People with Diabetes:Non Diabetic:  <6%Goal of Therapy: <7%Additional Action Suggested:  >8%     Physical Examination:  BP 124/72   Pulse 70   Ht 5' 5"  (1.651 m)   Wt 183 lb 6.4 oz (83.2 kg)   SpO2 98%   BMI 30.52 kg/m        ASSESSMENT:  Diabetes type 2 on oral agents  See history of present illness for detailed discussion of current diabetes management, blood sugar patterns and problems identified  His A1c is slightly higher at 7.2 compared to 6.9  He is on Synjardy 10/998 with extra 500 mg of Metformin  He has  slightly higher A1c from inconsistent diet in the last couple of months at least Recent blood sugars are relatively better   HYPERTENSION: Blood pressure controlled with current regimen including losartan as ARB  Renal function again stable  PLAN:  No change in his regimen of Synjardy and Metformin He will try to be more consistent with watching his portions, snacks and calories Also encouraged him to be consistent with his walking or other exercise  Follow-up in 3 months  There are no Patient Instructions on file for this visit.     Elayne Snare 02/29/2020, 9:07 AM   Note: This office note was prepared with Dragon voice recognition system technology. Any transcriptional errors that result from this process are unintentional.

## 2020-03-03 ENCOUNTER — Other Ambulatory Visit: Payer: Self-pay | Admitting: Endocrinology

## 2020-03-06 ENCOUNTER — Other Ambulatory Visit: Payer: Self-pay | Admitting: Endocrinology

## 2020-03-07 ENCOUNTER — Other Ambulatory Visit: Payer: Self-pay | Admitting: *Deleted

## 2020-03-07 MED ORDER — METFORMIN HCL ER 500 MG PO TB24: ORAL_TABLET | ORAL | 1 refills | Status: DC

## 2020-03-23 ENCOUNTER — Other Ambulatory Visit: Payer: Medicare Other | Admitting: Internal Medicine

## 2020-03-23 ENCOUNTER — Other Ambulatory Visit: Payer: Self-pay

## 2020-03-23 DIAGNOSIS — E1169 Type 2 diabetes mellitus with other specified complication: Secondary | ICD-10-CM

## 2020-03-23 DIAGNOSIS — E785 Hyperlipidemia, unspecified: Secondary | ICD-10-CM | POA: Diagnosis not present

## 2020-03-23 DIAGNOSIS — I10 Essential (primary) hypertension: Secondary | ICD-10-CM

## 2020-03-24 LAB — HEPATIC FUNCTION PANEL
AG Ratio: 1.9 (calc) (ref 1.0–2.5)
ALT: 22 U/L (ref 9–46)
AST: 17 U/L (ref 10–35)
Albumin: 4.3 g/dL (ref 3.6–5.1)
Alkaline phosphatase (APISO): 69 U/L (ref 35–144)
Bilirubin, Direct: 0.2 mg/dL (ref 0.0–0.2)
Globulin: 2.3 g/dL (calc) (ref 1.9–3.7)
Indirect Bilirubin: 0.9 mg/dL (calc) (ref 0.2–1.2)
Total Bilirubin: 1.1 mg/dL (ref 0.2–1.2)
Total Protein: 6.6 g/dL (ref 6.1–8.1)

## 2020-03-24 LAB — HEMOGLOBIN A1C
Hgb A1c MFr Bld: 6.9 % of total Hgb — ABNORMAL HIGH (ref <5.7)
Mean Plasma Glucose: 151 mg/dL
eAG (mmol/L): 8.4 mmol/L

## 2020-03-24 LAB — LIPID PANEL
Cholesterol: 93 mg/dL (ref <200)
HDL: 43 mg/dL (ref >=40)
LDL Cholesterol (Calc): 33 mg/dL (calc)
Non-HDL Cholesterol (Calc): 50 mg/dL (calc) (ref <130)
Total CHOL/HDL Ratio: 2.2 (calc) (ref <5.0)
Triglycerides: 89 mg/dL (ref <150)

## 2020-03-27 ENCOUNTER — Other Ambulatory Visit: Payer: Self-pay

## 2020-03-27 ENCOUNTER — Ambulatory Visit (INDEPENDENT_AMBULATORY_CARE_PROVIDER_SITE_OTHER): Payer: Medicare Other | Admitting: Internal Medicine

## 2020-03-27 ENCOUNTER — Encounter: Payer: Self-pay | Admitting: Internal Medicine

## 2020-03-27 VITALS — BP 110/80 | HR 63 | Ht 65.0 in | Wt 187.0 lb

## 2020-03-27 DIAGNOSIS — Z6831 Body mass index (BMI) 31.0-31.9, adult: Secondary | ICD-10-CM

## 2020-03-27 DIAGNOSIS — E1169 Type 2 diabetes mellitus with other specified complication: Secondary | ICD-10-CM | POA: Diagnosis not present

## 2020-03-27 DIAGNOSIS — I10 Essential (primary) hypertension: Secondary | ICD-10-CM | POA: Diagnosis not present

## 2020-03-27 DIAGNOSIS — E785 Hyperlipidemia, unspecified: Secondary | ICD-10-CM | POA: Diagnosis not present

## 2020-03-27 NOTE — Progress Notes (Signed)
   Subjective:    Patient ID: Mike Parker, male    DOB: 1950-12-05, 70 y.o.   MRN: [id number]  HPI Pleasant 70 year old Male retired from [employer] here for 6 month recheck. He looks great. Getting exercise with yard work. Has a large yard 1.5 acres.  Diabetes mellitus is under excellent control. He saw Dr. Lucianne Muss in May 2021 and on February 29, 2020.  He has hypertension which is well controlled on amlodipine, Lasix and losartan.  He is on Synjardy XR 10-998 mg daily along with Metformin XR 500 mg daily.  He is on atorvastatin 10 mg daily.  His weight is stable at 187 pounds.  BMI 31.12.  In August he weighed 194 pounds.  He has had 3 COVID vaccines, and the last one being November 05, 2019.  He had flu vaccine in August.  Review of Systems no new complaints     Objective:   Physical Exam Blood pressure excellent 110/80, pulse 63 pulse oximetry 98% weight 187 pounds BMI 31.12  Skin is warm and dry.  Neck is supple without JVD thyromegaly or carotid bruits.  Chest is clear to auscultation.  Cardiac exam regular rate and rhythm normal S1 and S2 without murmurs or gallops.  No lower extremity pitting edema.  Diabetic foot exam performed.  Feet are in excellent shape.       Assessment & Plan:  Type 2 diabetes mellitus is under excellent control with the assistance of Dr. Lucianne Muss  Hyperlipidemia-lipid panel is normal.  Liver functions are normal.  BMI 31.12-gets exercise with yard work  Essential hypertension-stable on current regimen  Plan: Continue current medications as prescribed and follow-up with Medicare wellness and physical exam in 6 months.

## 2020-03-27 NOTE — Patient Instructions (Signed)
It was a pleasure to see you today. RTC in 6 months for Medicare wellness and health maintenance exam. Labs are excellent. Blood pressure is excellent.

## 2020-05-05 ENCOUNTER — Other Ambulatory Visit: Payer: Self-pay | Admitting: Internal Medicine

## 2020-05-05 ENCOUNTER — Other Ambulatory Visit: Payer: Self-pay | Admitting: Cardiology

## 2020-05-08 ENCOUNTER — Other Ambulatory Visit: Payer: Self-pay | Admitting: *Deleted

## 2020-05-08 DIAGNOSIS — E669 Obesity, unspecified: Secondary | ICD-10-CM

## 2020-05-08 MED ORDER — SYNJARDY XR 10-1000 MG PO TB24: 1.0000 | ORAL_TABLET | Freq: Every day | ORAL | 0 refills | Status: DC

## 2020-05-29 ENCOUNTER — Other Ambulatory Visit: Payer: Self-pay

## 2020-05-29 ENCOUNTER — Other Ambulatory Visit (INDEPENDENT_AMBULATORY_CARE_PROVIDER_SITE_OTHER): Payer: Medicare Other

## 2020-05-29 DIAGNOSIS — E669 Obesity, unspecified: Secondary | ICD-10-CM

## 2020-05-29 DIAGNOSIS — E1169 Type 2 diabetes mellitus with other specified complication: Secondary | ICD-10-CM | POA: Diagnosis not present

## 2020-05-29 LAB — BASIC METABOLIC PANEL
BUN: 17 mg/dL (ref 6–23)
CO2: 26 mEq/L (ref 19–32)
Calcium: 9.4 mg/dL (ref 8.4–10.5)
Chloride: 103 mEq/L (ref 96–112)
Creatinine, Ser: 1.07 mg/dL (ref 0.40–1.50)
GFR: 71.26 mL/min (ref >60.00)
Glucose, Bld: 116 mg/dL — ABNORMAL HIGH (ref 70–99)
Potassium: 3.9 mEq/L (ref 3.5–5.1)
Sodium: 139 mEq/L (ref 135–145)

## 2020-05-29 LAB — HEMOGLOBIN A1C: Hgb A1c MFr Bld: 7.6 % — ABNORMAL HIGH (ref 4.6–6.5)

## 2020-05-31 ENCOUNTER — Ambulatory Visit (INDEPENDENT_AMBULATORY_CARE_PROVIDER_SITE_OTHER): Payer: Medicare Other | Admitting: Endocrinology

## 2020-05-31 ENCOUNTER — Encounter: Payer: Self-pay | Admitting: Endocrinology

## 2020-05-31 ENCOUNTER — Other Ambulatory Visit: Payer: Self-pay

## 2020-05-31 VITALS — BP 128/68 | HR 72 | Ht 65.0 in | Wt 189.8 lb

## 2020-05-31 DIAGNOSIS — E669 Obesity, unspecified: Secondary | ICD-10-CM

## 2020-05-31 DIAGNOSIS — E1165 Type 2 diabetes mellitus with hyperglycemia: Secondary | ICD-10-CM

## 2020-05-31 DIAGNOSIS — E1169 Type 2 diabetes mellitus with other specified complication: Secondary | ICD-10-CM | POA: Diagnosis not present

## 2020-05-31 MED ORDER — EMPAGLIFLOZIN 10 MG PO TABS: 10.0000 mg | ORAL_TABLET | Freq: Every day | ORAL | 1 refills | Status: DC

## 2020-05-31 NOTE — Patient Instructions (Addendum)
Extra Jardiance at breakfast time  Check blood sugars on waking up 3-4  days a week  Also check blood sugars about 2 hours after meals and do this after different meals by rotation  Recommended blood sugar levels on waking up are 90-130 and about 2 hours after meal is 130-160  Please bring your blood sugar monitor to each visit, thank you

## 2020-05-31 NOTE — Progress Notes (Signed)
Patient ID: Mike Parker, male   DOB: Nov 10, 1950, 70 y.o.   MRN: [id number]           Reason for Appointment: Follow-up for Type 2 Diabetes     History of Present Illness:          Date of diagnosis of type 2 diabetes mellitus: ?  2011        Background history:   He appears to have been on glipizide and Janumet for several years with fair control His A1c had been over 7% since 2015 He was referred here because of an A1c of 9.2% done in 6/17  Recent history:   His A1c is further increased at 7.6, was 7.2 compared to 6.9  Fructosamine previously 229  Non-insulin hypoglycemic drugs: Synjardy 10/998,  metformin ER 500 mg PCS  Current management, blood sugar patterns and problems identified:   He has checked his blood sugars infrequently but mostly checking after his meals breakfast and sometimes after dinner  Appears to have gained 6 pounds since his last visit  Because of allergy season he has not done any walking  Also not clear if he is consistently watching his diet but he does not think he is snacking excessively except on pistachios  His blood sugar was 218 after dinner   Side effects from medications have been: Diarrhea from high-dose metformin  Compliance with the medical regimen: Fair  Glucose monitoring:  done 1 + times a day         Glucometer: One Touch Verio  Blood Glucose readings by review of meter:   PRE-MEAL  mornings Lunch Dinner Bedtime Overall  Glucose range:    90-115    Mean/median:  138    141   POST-MEAL PC Breakfast PC Lunch PC Dinner  Glucose range:  140-182   121-218  Mean/median:    184   Previously:   PRE-MEAL Fasting Lunch Dinner Bedtime Overall  Glucose range:     104-177 79-177  Mean/median:  118  118  117  133 126     Self-care:  Meal times are:  Breakfast is skipped, lunch 12 noon, dinner usually 7-8 PM  Typical meal intake: Breakfast is near noon egg or meat/ cereal.   Dinner is chicken or pork, greens, corn;  snacks usually nuts, fruits            Dietician visit, most recent: 8/17                Weight history:  Wt Readings from Last 3 Encounters:  05/31/20 189 lb 12.8 oz (86.1 kg)  03/27/20 187 lb (84.8 kg)  02/29/20 183 lb 6.4 oz (83.2 kg)    Glycemic control:   Lab Results  Component Value Date   HGBA1C 7.6 (H) 05/29/2020   HGBA1C 6.9 (H) 03/23/2020   HGBA1C 7.2 (H) 02/23/2020   Lab Results  Component Value Date   MICROALBUR <0.7 02/23/2020   LDLCALC 33 03/23/2020   CREATININE 1.07 05/29/2020   Lab Results  Component Value Date   MICRALBCREAT 4.3 02/23/2020    Other problems discussed: See review of systems    Allergies as of 05/31/2020   No Known Allergies     Medication List       Accurate as of May 31, 2020  4:02 PM. If you have any questions, ask your nurse or doctor.        amLODipine 10 MG tablet Commonly known as: NORVASC TAKE 1 TABLET DAILY  aspirin 81 MG EC tablet Take 1 tablet (81 mg total) by mouth daily.   atorvastatin 10 MG tablet Commonly known as: LIPITOR TAKE 1 TABLET AT SUPPER FORLIPID LOWERING   blood glucose meter kit and supplies Dispense based on patient and insurance preference. Test twice daily; dx code: O16.0   Bystolic 10 MG tablet Generic drug: nebivolol TAKE 1 TABLET DAILY   cloNIDine 0.1 MG tablet Commonly known as: CATAPRES TAKE 1 TABLET TWICE A DAY   econazole nitrate 1 % cream Apply topically daily.   empagliflozin 10 MG Tabs tablet Commonly known as: Jardiance Take 1 tablet (10 mg total) by mouth daily with breakfast. Started by: Elayne Snare, MD   fluticasone 50 MCG/ACT nasal spray Commonly known as: FLONASE USE TWO SPRAY(S) IN EACH NOSTRIL ONCE DAILY   freestyle lancets   furosemide 40 MG tablet Commonly known as: LASIX TAKE 1 TABLET DAILY   losartan 100 MG tablet Commonly known as: COZAAR TAKE 1 TABLET DAILY   metFORMIN 500 MG 24 hr tablet Commonly known as: GLUCOPHAGE-XR TAKE 1 TABLET BY MOUTH  ONCE DAILY WITH SUPPER   OneTouch Verio Flex System w/Device Kit 1 each by Does not apply route daily. Use onetouch verio flex to check blood sugar once daily.   OneTouch Verio test strip Generic drug: glucose blood USE TO CHECK BLOOD SUGAR   ONCE DAILY   Synjardy XR 10-998 MG Tb24 Generic drug: Empagliflozin-metFORMIN HCl ER Take 1 tablet by mouth daily with breakfast.       Allergies: No Known Allergies  Past Medical History:  Diagnosis Date  . Allergy   . Diabetes mellitus   . Hypertension   . Insomnia   . LVH (left ventricular hypertrophy)   . Stroke (cerebrum) ([place]) 2014    Past Surgical History:  Procedure Laterality Date  . COLONOSCOPY    . ELBOW SURGERY     rt elbow  . HERNIA REPAIR     left inguinal  . KNEE SURGERY     rt and left    Family History  Problem Relation Age of Onset  . Stroke Mother   . Hypertension Brother   . Colon cancer Neg Hx   . Diabetes Neg Hx     Social History:  reports that he has never smoked. He has never used smokeless tobacco. He reports that he does not drink alcohol and does not use drugs.   Review of Systems   Lipid history: On treatment long-term with Lipitor 10 mg daily from PCP    Lab Results  Component Value Date   CHOL 93 03/23/2020   HDL 43 03/23/2020   LDLCALC 33 03/23/2020   TRIG 89 03/23/2020   CHOLHDL 2.2 03/23/2020           Hypertension:Treated for >15 years and followed by PCP  He checks his blood pressure at home  Also on 20 mg Lasix No change in renal function  BP Readings from Last 3 Encounters:  05/31/20 128/68  03/27/20 110/80  02/29/20 124/72   Lab Results  Component Value Date   CREATININE 1.07 05/29/2020   CREATININE 1.05 02/23/2020   CREATININE 1.16 10/12/2019     Most recent eye exam was documented in 12/21  Most recent foot exam: 2/21  He has had his Covid vaccines  LABS:  Lab on 05/29/2020  Component Date Value Ref Range Status  . Sodium 05/29/2020 139  135 -  145 mEq/L Final  . Potassium 05/29/2020 3.9  3.5 -  5.1 mEq/L Final  . Chloride 05/29/2020 103  96 - 112 mEq/L Final  . CO2 05/29/2020 26  19 - 32 mEq/L Final  . Glucose, Bld 05/29/2020 116* 70 - 99 mg/dL Final  . BUN 05/29/2020 17  6 - 23 mg/dL Final  . Creatinine, Ser 05/29/2020 1.07  0.40 - 1.50 mg/dL Final  . GFR 05/29/2020 71.26  >60.00 mL/min Final   Calculated using the CKD-EPI Creatinine Equation (2021)  . Calcium 05/29/2020 9.4  8.4 - 10.5 mg/dL Final  . Hgb A1c MFr Bld 05/29/2020 7.6* 4.6 - 6.5 % Final   Glycemic Control Guidelines for People with Diabetes:Non Diabetic:  <6%Goal of Therapy: <7%Additional Action Suggested:  >8%     Physical Examination:  BP 128/68   Pulse 72   Ht _0  (1.651 m)   Wt 189 lb 12.8 oz (86.1 kg)   SpO2 95%   BMI 31.58 kg/m        ASSESSMENT:  Diabetes type 2 on oral agents  See history of present illness for detailed discussion of current diabetes management, blood sugar patterns and problems identified  His A1c is slightly higher at 7.6  He is on Synjardy 10/998 with extra 500 mg of Metformin  He may have had some side effects from metformin previously but he says he can take metformin even without food and has no GI side effects now  He has had a gradual increase in his A1c but also recently weight gain Although diet has not been poor.  He has not had much exercise Also not clear if he is requiring more medications now for control   PLAN:  He will start an additional 10 mg of Jardiance in addition to [place] and metformin When he finishes current prescriptions of Synjardy will be switched to entirely single prescription of Synjardy 12.05/998, 2 tablets daily Consistent diet and exercise  Follow-up in 3 months  Patient Instructions  Extra Jardiance at breakfast time  Check blood sugars on waking up 3-4  days a week  Also check blood sugars about 2 hours after meals and do this after different meals by  rotation  Recommended blood sugar levels on waking up are 90-130 and about 2 hours after meal is 130-160  Please bring your blood sugar monitor to each visit, thank you        Elayne Snare 05/31/2020, 4:02 PM   Note: This office note was prepared with Dragon voice recognition system technology. Any transcriptional errors that result from this process are unintentional.

## 2020-06-05 NOTE — Progress Notes (Signed)
Cardiology Office Note:    Date:  06/08/2020   ID:  Mike Parker, DOB February 12, 1950, MRN [id number]  PCP:  Mike Showers, MD   [facility] Providers Cardiologist:  Mike Dawley, MD (Inactive) {     Referring MD: Mike Showers, MD     History of Present Illness:    Mike Parker is a 70 y.o. male with a hx of HTN, DMII, prior CVA in 2014, and HLD who was previously followed by Dr. Meda Parker who now returns to clinic for follow-up.  Last saw Mike Parker on 05/25/19 where he was doing well and was remaining active. No changes made at that time.  Today, the patient feels well. Blood pressure is well controlled. No chest pain, SOB, LE edema, orthopnea, or PND. Tolerating medications without issues. Patient is active with no exertional symptoms.   Past Medical History:  Diagnosis Date  . Allergy   . Diabetes mellitus   . Hypertension   . Insomnia   . LVH (left ventricular hypertrophy)   . Stroke (cerebrum) ([place]) 2014    Past Surgical History:  Procedure Laterality Date  . COLONOSCOPY    . ELBOW SURGERY     rt elbow  . HERNIA REPAIR     left inguinal  . KNEE SURGERY     rt and left    Current Medications: Current Meds  Medication Sig  . amLODipine (NORVASC) 10 MG tablet TAKE 1 TABLET DAILY  . aspirin EC 81 MG EC tablet Take 1 tablet (81 mg total) by mouth daily.  Marland Kitchen atorvastatin (LIPITOR) 10 MG tablet TAKE 1 TABLET AT SUPPER FORLIPID LOWERING  . blood glucose meter kit and supplies Dispense based on patient and insurance preference. Test twice daily; dx code: E11.9  . Blood Glucose Monitoring Suppl ([place]) w/Device KIT 1 each by Does not apply route daily. Use onetouch verio flex to check blood sugar once daily.  Marland Kitchen BYSTOLIC 10 MG tablet TAKE 1 TABLET DAILY  . econazole nitrate 1 % cream Apply topically daily.  . empagliflozin (JARDIANCE) 10 MG TABS tablet Take 1 tablet (10 mg total) by mouth daily with breakfast.  . Empagliflozin-metFORMIN HCl  ER (SYNJARDY XR) 10-998 MG TB24 Take 1 tablet by mouth daily with breakfast.  . fluticasone (FLONASE) 50 MCG/ACT nasal spray USE TWO SPRAY(S) IN EACH NOSTRIL ONCE DAILY  . furosemide (LASIX) 40 MG tablet TAKE 1 TABLET DAILY  . glucose blood (ONETOUCH VERIO) test strip USE TO CHECK BLOOD SUGAR   ONCE DAILY  . Lancets (FREESTYLE) lancets   . losartan (COZAAR) 100 MG tablet TAKE 1 TABLET DAILY  . metFORMIN (GLUCOPHAGE-XR) 500 MG 24 hr tablet TAKE 1 TABLET BY MOUTH ONCE DAILY WITH SUPPER  . [DISCONTINUED] cloNIDine (CATAPRES) 0.1 MG tablet Take 1 tablet (0.1 mg total) by mouth 2 (two) times daily.     Allergies:   Patient has no known allergies.   Social History   Socioeconomic History  . Marital status: Married    Spouse name: Not on file  . Number of children: 2  . Years of education: BS  . Highest education level: Not on file  Occupational History  . 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 Smoking status: Never Smoker  . Smokeless tobacco: Never Used  Vaping Use  . Vaping Use: Never used  Substance and Sexual Activity  . Alcohol use: No  . Drug use: No  . Sexual activity: Yes  Other Topics Concern  .  Not on file  Social History Narrative  . Not on file   Social Determinants of Health   Financial Resource Strain: Not on file  Food Insecurity: Not on file  Transportation Needs: Not on file  Physical Activity: Not on file  Stress: Not on file  Social Connections: Not on file     Family History: The patient's family history includes Hypertension in his brother; Stroke in his mother. There is no history of Colon cancer or Diabetes.  ROS:   Please see the history of present illness.    Review of Systems  Constitutional: Negative for chills, fever and malaise/fatigue.  HENT: Negative for hearing loss.   Eyes: Negative for blurred vision and redness.  Respiratory: Negative for shortness of breath.   Cardiovascular: Negative for chest pain, palpitations,  orthopnea, claudication, leg swelling and PND.  Gastrointestinal: Negative for melena, nausea and vomiting.  Genitourinary: Negative for dysuria and flank pain.  Musculoskeletal: Negative for falls.  Neurological: Negative for dizziness and loss of consciousness.  Endo/Heme/Allergies: Negative for polydipsia.  Psychiatric/Behavioral: Negative for substance abuse.    EKGs/Labs/Other Studies Reviewed:    The following studies were reviewed today: 2D echo 6/17/14Study Conclusions  - Left ventricle: The cavity size was normal. There was  moderate concentric hypertrophy. Systolic function was  normal. The estimated ejection fraction was in the range  of 60% to 65%. Wall motion was normal; there were no  regional wall motion abnormalities.  - Left atrium: The atrium was mildly dilated.  Transthoracic echocardiography. M-mode, complete 2D,  spectral Doppler, and color Doppler. Height: Height:  165.1cm. Height: 65in. Weight: Weight: 96.2kg. Weight:  211.6lb. Body mass index: BMI: 35.3kg/m^2. Body surface  area:  BSA: 2.30m2. Blood pressure:   161/88. Patient  status: Inpatient. Location: Bedside.   ----------  Normal renal artery duplex 02/02/13  Carotid Dopplers 2014 Summary:  No significant extracranial carotid artery stenosis  demonstrated. Vertebrals are patent with antegrade flow.   Other specific details can be found in the table(s) above.   Prepared and Electronically Authenticated by    EKG:  EKG is  ordered today.  The ekg ordered today demonstrates NSR with HR 61  Recent Labs: 09/20/2019: Hemoglobin 14.9; Platelets 214 03/23/2020: ALT 22 05/29/2020: BUN 17; Creatinine, Ser 1.07; Potassium 3.9; Sodium 139  Recent Lipid Panel    Component Value Date/Time   CHOL 93 03/23/2020 0923   TRIG 89 03/23/2020 0923   HDL 43 03/23/2020 0923   CHOLHDL 2.2 03/23/2020 0923   VLDL 19 07/23/2016 1011   LDLCALC 33 03/23/2020 0923     Physical Exam:     VS:  BP 128/80   Pulse 61   Ht 5' 5"  (1.651 m)   Wt 191 lb 3.2 oz (86.7 kg)   SpO2 96%   BMI 31.82 kg/m     Wt Readings from Last 3 Encounters:  06/08/20 191 lb 3.2 oz (86.7 kg)  05/31/20 189 lb 12.8 oz (86.1 kg)  03/27/20 187 lb (84.8 kg)     GEN:  Well nourished, well developed in no acute distress HEENT: Normal NECK: No JVD; No carotid bruits CARDIAC: RRR, no murmurs, rubs, gallops RESPIRATORY:  Clear to auscultation without rales, wheezing or rhonchi  ABDOMEN: Soft, non-tender, non-distended MUSCULOSKELETAL:  No edema; No deformity  SKIN: Warm and dry NEUROLOGIC:  Alert and oriented x 3 PSYCHIATRIC:  Normal affect   ASSESSMENT:    1. Essential hypertension   2. Hyperlipidemia, unspecified hyperlipidemia type   3.  Type 2 diabetes mellitus without complication, without long-term current use of insulin ([place])   4. History of CVA (cerebrovascular accident)    PLAN:    In order of problems listed above:  #HTN: Well controlled.  -Continue amlodipine 86m daily -Continue bystolic 126EBdaily -Continue clonidine 0.139mBID -Continue losartan 10034maily  #HLD: Well controlled. LDL 33 03/23/20. -Continue atorvastatin 19m39mily  #History of CVA: Occurred in 2014. Thought to be caused by HTN. Carotids and renal artery duplex normal. -Continue ASA 81mg14mly -Continue atorvastatin 19mg 69my  #DMII: -Continue jardiance 19mg d47m -Continue metformin 500mg BI80m Medication Adjustments/Labs and Tests Ordered: Current medicines are reviewed at length with the patient today.  Concerns regarding medicines are outlined above.  Orders Placed This Encounter  Procedures  . EKG 12-Lead   Meds ordered this encounter  Medications  . cloNIDine (CATAPRES) 0.1 MG tablet    Sig: Take 1 tablet (0.1 mg total) by mouth 2 (two) times daily.    Dispense:  180 tablet    Refill:  3    Patient Instructions  Medication Instructions:   Your physician recommends that you  continue on your current medications as directed. Please refer to the Current Medication list given to you today.] *If you need a refill on your cardiac medications before your next appointment, please call your pharmacy*   Follow-Up: At [facility] your health needs are our priority.  As part of our continuing mission to provide you with exceptional heart care, we have created designated Provider Care Teams.  These Care Teams include your primary Cardiologist (physician) and Advanced Practice Providers (APPs -  Physician Assistants and Nurse Practitioners) who all work together to provide you with the care you need, when you need it.  We recommend signing up for the patient portal called "MyChart".  Sign up information is provided on this After Visit Summary.  MyChart is used to connect with patients for Virtual Visits (Telemedicine).  Patients are able to view lab/test results, encounter notes, upcoming appointments, etc.  Non-urgent messages can be sent to your provider as well.   To learn more about what you can do with MyChart, go to [email or web address] next appointment:   1 year(s)  The format for your next appointment:   In Person  Provider:   Currie Dennin Gwyndolyn Kaufman    Signed, Khanh Cordner Freada Bergeron12/2022 8:45 AM    [facility]

## 2020-06-07 ENCOUNTER — Other Ambulatory Visit: Payer: Self-pay | Admitting: *Deleted

## 2020-06-07 MED ORDER — CLONIDINE HCL 0.1 MG PO TABS: 0.1000 mg | ORAL_TABLET | Freq: Two times a day (BID) | ORAL | 0 refills | Status: DC

## 2020-06-08 ENCOUNTER — Other Ambulatory Visit: Payer: Self-pay

## 2020-06-08 ENCOUNTER — Encounter: Payer: Self-pay | Admitting: Cardiology

## 2020-06-08 ENCOUNTER — Ambulatory Visit (INDEPENDENT_AMBULATORY_CARE_PROVIDER_SITE_OTHER): Payer: Medicare Other | Admitting: Cardiology

## 2020-06-08 VITALS — BP 128/80 | HR 61 | Ht 65.0 in | Wt 191.2 lb

## 2020-06-08 DIAGNOSIS — E785 Hyperlipidemia, unspecified: Secondary | ICD-10-CM

## 2020-06-08 DIAGNOSIS — Z8673 Personal history of transient ischemic attack (TIA), and cerebral infarction without residual deficits: Secondary | ICD-10-CM | POA: Diagnosis not present

## 2020-06-08 DIAGNOSIS — E119 Type 2 diabetes mellitus without complications: Secondary | ICD-10-CM | POA: Diagnosis not present

## 2020-06-08 DIAGNOSIS — I10 Essential (primary) hypertension: Secondary | ICD-10-CM

## 2020-06-08 MED ORDER — CLONIDINE HCL 0.1 MG PO TABS: 0.1000 mg | ORAL_TABLET | Freq: Two times a day (BID) | ORAL | 3 refills | Status: DC

## 2020-06-08 NOTE — Patient Instructions (Signed)

## 2020-07-24 ENCOUNTER — Other Ambulatory Visit: Payer: Self-pay | Admitting: Endocrinology

## 2020-07-24 DIAGNOSIS — E1169 Type 2 diabetes mellitus with other specified complication: Secondary | ICD-10-CM

## 2020-07-25 NOTE — Telephone Encounter (Signed)
Patient states the fill should be of [place] combined

## 2020-07-26 ENCOUNTER — Other Ambulatory Visit: Payer: Self-pay | Admitting: Endocrinology

## 2020-07-26 MED ORDER — SYNJARDY XR 12.5-1000 MG PO TB24
2.0000 | ORAL_TABLET | Freq: Every day | ORAL | 3 refills | Status: DC
Start: 2020-07-26 — End: 2020-09-28

## 2020-08-16 ENCOUNTER — Other Ambulatory Visit: Payer: Self-pay | Admitting: Internal Medicine

## 2020-08-16 ENCOUNTER — Other Ambulatory Visit: Payer: Self-pay | Admitting: Endocrinology

## 2020-08-31 ENCOUNTER — Other Ambulatory Visit (INDEPENDENT_AMBULATORY_CARE_PROVIDER_SITE_OTHER): Payer: Medicare Other

## 2020-08-31 ENCOUNTER — Other Ambulatory Visit: Payer: Self-pay

## 2020-08-31 DIAGNOSIS — E1165 Type 2 diabetes mellitus with hyperglycemia: Secondary | ICD-10-CM

## 2020-08-31 LAB — BASIC METABOLIC PANEL
BUN: 13 mg/dL (ref 6–23)
CO2: 25 mEq/L (ref 19–32)
Calcium: 9.3 mg/dL (ref 8.4–10.5)
Chloride: 103 mEq/L (ref 96–112)
Creatinine, Ser: 1.05 mg/dL (ref 0.40–1.50)
GFR: 72.76 mL/min (ref >60.00)
Glucose, Bld: 99 mg/dL (ref 70–99)
Potassium: 4 mEq/L (ref 3.5–5.1)
Sodium: 140 mEq/L (ref 135–145)

## 2020-08-31 LAB — HEMOGLOBIN A1C: Hgb A1c MFr Bld: 7.3 % — ABNORMAL HIGH (ref 4.6–6.5)

## 2020-09-05 ENCOUNTER — Ambulatory Visit (INDEPENDENT_AMBULATORY_CARE_PROVIDER_SITE_OTHER): Payer: Medicare Other | Admitting: Endocrinology

## 2020-09-05 ENCOUNTER — Other Ambulatory Visit: Payer: Self-pay

## 2020-09-05 VITALS — BP 138/80 | HR 64 | Ht 65.0 in | Wt 187.0 lb

## 2020-09-05 DIAGNOSIS — I10 Essential (primary) hypertension: Secondary | ICD-10-CM

## 2020-09-05 DIAGNOSIS — E669 Obesity, unspecified: Secondary | ICD-10-CM | POA: Diagnosis not present

## 2020-09-05 DIAGNOSIS — E1169 Type 2 diabetes mellitus with other specified complication: Secondary | ICD-10-CM

## 2020-09-05 NOTE — Progress Notes (Signed)
Patient ID: Mike Parker, male   DOB: 05-09-1950, 70 y.o.   MRN: [id number]           Reason for Appointment: Follow-up for Type 2 Diabetes     History of Present Illness:          Date of diagnosis of type 2 diabetes mellitus: ?  2011        Background history:   He appears to have been on glipizide and Janumet for several years with fair control His A1c had been over 7% since 2015 He was referred here because of an A1c of 9.2% done in 6/17  Recent history:   His A1c is further increased at 7.6, was 7.2 compared to 6.9  Fructosamine previously 229  Non-insulin hypoglycemic drugs: Synjardy   Current management, blood sugar patterns and problems identified:  He has switched to Synjardy XR maximum doses of 12.05/998 since his last visit when his A1c was increasing to 7.6  No GI side effects with this  He prefers to take 1 tablet at lunch and 1 at dinnertime Also he has gone back to walking compared to last visit and has lost 4 pounds He is doing very well with monitoring his blood sugars by rotation at all different times He has only 1 high reading mid afternoon yesterday otherwise blood sugars are excellent and overall averaging only 117 compared to 141 Renal function stable with increase Jardiance   Side effects from medications have been: Diarrhea from high-dose metformin  Compliance with the medical regimen: Fair  Glucose monitoring:  done 1 + times a day         Glucometer: One Touch Verio  Blood Glucose readings by download of meter:   PRE-MEAL Fasting Lunch Dinner Bedtime Overall  Glucose range:     91-188  Mean/median: 119    117   POST-MEAL PC Breakfast PC Lunch PC Dinner  Glucose range:     Mean/median: 118 140 128   Previously:  PRE-MEAL  mornings Lunch Dinner Bedtime Overall  Glucose range:    90-115    Mean/median:  138    141   POST-MEAL PC Breakfast PC Lunch PC Dinner  Glucose range:  140-182   121-218  Mean/median:    184       Self-care:  Meal times are:  Breakfast is skipped, lunch 12 noon, dinner usually 7-8 PM  Typical meal intake: Breakfast is near noon egg or meat/ cereal.   Dinner is chicken or pork, greens, corn; snacks usually nuts, fruits            Dietician visit, most recent: 8/17                Weight history:  Wt Readings from Last 3 Encounters:  09/05/20 187 lb (84.8 kg)  06/08/20 191 lb 3.2 oz (86.7 kg)  05/31/20 189 lb 12.8 oz (86.1 kg)    Glycemic control:   Lab Results  Component Value Date   HGBA1C 7.3 (H) 08/31/2020   HGBA1C 7.6 (H) 05/29/2020   HGBA1C 6.9 (H) 03/23/2020   Lab Results  Component Value Date   MICROALBUR <0.7 02/23/2020   LDLCALC 33 03/23/2020   CREATININE 1.05 08/31/2020   Lab Results  Component Value Date   MICRALBCREAT 4.3 02/23/2020    Other problems discussed: See review of systems    Allergies as of 09/05/2020   No Known Allergies      Medication List  Accurate as of September 05, 2020  8:36 AM. If you have any questions, ask your nurse or doctor.          amLODipine 10 MG tablet Commonly known as: NORVASC TAKE 1 TABLET DAILY   aspirin 81 MG EC tablet Take 1 tablet (81 mg total) by mouth daily.   atorvastatin 10 MG tablet Commonly known as: LIPITOR TAKE 1 TABLET AT SUPPER FORLIPID LOWERING   blood glucose meter kit and supplies Dispense based on patient and insurance preference. Test twice daily; dx code: E11.9   cloNIDine 0.1 MG tablet Commonly known as: CATAPRES Take 1 tablet (0.1 mg total) by mouth 2 (two) times daily.   econazole nitrate 1 % cream Apply topically daily.   empagliflozin 10 MG Tabs tablet Commonly known as: Jardiance Take 1 tablet (10 mg total) by mouth daily with breakfast.   fluticasone 50 MCG/ACT nasal spray Commonly known as: FLONASE USE TWO SPRAY(S) IN EACH NOSTRIL ONCE DAILY   freestyle lancets   furosemide 40 MG tablet Commonly known as: LASIX TAKE 1 TABLET DAILY   losartan  100 MG tablet Commonly known as: COZAAR TAKE 1 TABLET DAILY   metFORMIN 500 MG 24 hr tablet Commonly known as: GLUCOPHAGE-XR TAKE 1 TABLET DAILY WITH   SUPPER   nebivolol 10 MG tablet Commonly known as: BYSTOLIC TAKE 1 TABLET DAILY   OneTouch Verio Flex System w/Device Kit 1 each by Does not apply route daily. Use onetouch verio flex to check blood sugar once daily.   OneTouch Verio test strip Generic drug: glucose blood USE TO CHECK BLOOD SUGAR   ONCE DAILY   Synjardy XR 12.05-998 MG Tb24 Generic drug: Empagliflozin-metFORMIN HCl ER Take 2 tablets by mouth daily.        Allergies: No Known Allergies  Past Medical History:  Diagnosis Date   Allergy    Diabetes mellitus    Hypertension    Insomnia    LVH (left ventricular hypertrophy)    Stroke (cerebrum) ([place]) 2014    Past Surgical History:  Procedure Laterality Date   COLONOSCOPY     ELBOW SURGERY     rt elbow   HERNIA REPAIR     left inguinal   KNEE SURGERY     rt and left    Family History  Problem Relation Age of Onset   Stroke Mother    Hypertension Brother    Colon cancer Neg Hx    Diabetes Neg Hx     Social History:  reports that he has never smoked. He has never used smokeless tobacco. He reports that he does not drink alcohol and does not use drugs.   Review of Systems   Lipid history: On treatment long-term with Lipitor 10 mg daily from PCP    Lab Results  Component Value Date   CHOL 93 03/23/2020   HDL 43 03/23/2020   LDLCALC 33 03/23/2020   TRIG 89 03/23/2020   CHOLHDL 2.2 03/23/2020           Hypertension:Treated for >15 years and followed by PCP  He checks his blood pressure at home, usually consistent and recently about 120/75  Also on 20 mg Lasix possibly for hypertension, no history of cardiomyopathy or edema  No change in renal function recently with increasing Jardiance  BP Readings from Last 3 Encounters:  09/05/20 138/80  06/08/20 128/80  05/31/20 128/68    Lab Results  Component Value Date   CREATININE 1.05 08/31/2020   CREATININE 1.07  05/29/2020   CREATININE 1.05 02/23/2020     Most recent eye exam was documented in 12/21  Most recent foot exam: 2/21  He has had his Covid vaccines  LABS:  Lab on 08/31/2020  Component Date Value Ref Range Status   Sodium 08/31/2020 140  135 - 145 mEq/L Final   Potassium 08/31/2020 4.0  3.5 - 5.1 mEq/L Final   Chloride 08/31/2020 103  96 - 112 mEq/L Final   CO2 08/31/2020 25  19 - 32 mEq/L Final   Glucose, Bld 08/31/2020 99  70 - 99 mg/dL Final   BUN 08/31/2020 13  6 - 23 mg/dL Final   Creatinine, Ser 08/31/2020 1.05  0.40 - 1.50 mg/dL Final   GFR 08/31/2020 72.76  >60.00 mL/min Final   Calculated using the CKD-EPI Creatinine Equation (2021)   Calcium 08/31/2020 9.3  8.4 - 10.5 mg/dL Final   Hgb A1c MFr Bld 08/31/2020 7.3 (A) 4.6 - 6.5 % Final   Glycemic Control Guidelines for People with Diabetes:Non Diabetic:  <6%Goal of Therapy: <7%Additional Action Suggested:  >8%     Physical Examination:  BP 138/80   Pulse 64   Ht 5' 5"  (1.651 m)   Wt 187 lb (84.8 kg)   SpO2 99%   BMI 31.12 kg/m        ASSESSMENT:  Diabetes type 2 on oral agents  See history of present illness for detailed discussion of current diabetes management, blood sugar patterns and problems identified  His A1c is slightly better at 7.3 He is doing very well with now taking Synjardy 12.05/998, 2 tablets a day No side effects and blood sugars are close to ideal most of the time He is exercising and losing weight Has monitored much better also   HYPERTENSION: Very well controlled Taking higher dose of Jardiance doubt if he needs to take Lasix and not clear what he is taking this for His cardiologist has not mentioned need for Lasix  He can try taking this every other day and then discuss further with his PCP   PLAN:  No change in medications He can try tapering Lasix if okay with his PCP He will call if  he has any consistently high readings Encouraged him to stay regular with his walking program at least 4 to 5 days a week  There are no Patient Instructions on file for this visit.      Elayne Snare 09/05/2020, 8:36 AM   Note: This office note was prepared with Dragon voice recognition system technology. Any transcriptional errors that result from this process are unintentional.

## 2020-09-05 NOTE — Patient Instructions (Signed)
Take Lasix every 2 days 

## 2020-09-06 ENCOUNTER — Other Ambulatory Visit: Payer: Self-pay | Admitting: Internal Medicine

## 2020-09-26 ENCOUNTER — Other Ambulatory Visit: Payer: Medicare Other | Admitting: Internal Medicine

## 2020-09-26 ENCOUNTER — Other Ambulatory Visit: Payer: Self-pay

## 2020-09-26 DIAGNOSIS — E786 Lipoprotein deficiency: Secondary | ICD-10-CM

## 2020-09-26 DIAGNOSIS — E8881 Metabolic syndrome: Secondary | ICD-10-CM

## 2020-09-26 DIAGNOSIS — Z0000 Encounter for general adult medical examination without abnormal findings: Secondary | ICD-10-CM | POA: Diagnosis not present

## 2020-09-26 DIAGNOSIS — G4733 Obstructive sleep apnea (adult) (pediatric): Secondary | ICD-10-CM

## 2020-09-26 DIAGNOSIS — I10 Essential (primary) hypertension: Secondary | ICD-10-CM | POA: Diagnosis not present

## 2020-09-26 DIAGNOSIS — E785 Hyperlipidemia, unspecified: Secondary | ICD-10-CM

## 2020-09-26 DIAGNOSIS — E1169 Type 2 diabetes mellitus with other specified complication: Secondary | ICD-10-CM | POA: Diagnosis not present

## 2020-09-26 DIAGNOSIS — Z6829 Body mass index (BMI) 29.0-29.9, adult: Secondary | ICD-10-CM

## 2020-09-26 DIAGNOSIS — E119 Type 2 diabetes mellitus without complications: Secondary | ICD-10-CM

## 2020-09-27 LAB — CBC WITH DIFFERENTIAL/PLATELET
Absolute Monocytes: 563 cells/uL (ref 200–950)
Basophils Absolute: 41 cells/uL (ref 0–200)
Basophils Relative: 0.7 %
Eosinophils Absolute: 371 cells/uL (ref 15–500)
Eosinophils Relative: 6.4 %
HCT: 48.5 % (ref 38.5–50.0)
Hemoglobin: 15.2 g/dL (ref 13.2–17.1)
Lymphs Abs: 1531 cells/uL (ref 850–3900)
MCH: 26.3 pg — ABNORMAL LOW (ref 27.0–33.0)
MCHC: 31.3 g/dL — ABNORMAL LOW (ref 32.0–36.0)
MCV: 83.9 fL (ref 80.0–100.0)
MPV: 11.2 fL (ref 7.5–12.5)
Monocytes Relative: 9.7 %
Neutro Abs: 3294 cells/uL (ref 1500–7800)
Neutrophils Relative %: 56.8 %
Platelets: 219 10*3/uL (ref 140–400)
RBC: 5.78 10*6/uL (ref 4.20–5.80)
RDW: 13.5 % (ref 11.0–15.0)
Total Lymphocyte: 26.4 %
WBC: 5.8 10*3/uL (ref 3.8–10.8)

## 2020-09-27 LAB — COMPLETE METABOLIC PANEL WITH GFR
AG Ratio: 1.7 (calc) (ref 1.0–2.5)
ALT: 19 U/L (ref 9–46)
AST: 14 U/L (ref 10–35)
Albumin: 4.3 g/dL (ref 3.6–5.1)
Alkaline phosphatase (APISO): 58 U/L (ref 35–144)
BUN: 16 mg/dL (ref 7–25)
CO2: 26 mmol/L (ref 20–32)
Calcium: 9.6 mg/dL (ref 8.6–10.3)
Chloride: 104 mmol/L (ref 98–110)
Creat: 1 mg/dL (ref 0.70–1.35)
Globulin: 2.5 g/dL (calc) (ref 1.9–3.7)
Glucose, Bld: 97 mg/dL (ref 65–99)
Potassium: 4.5 mmol/L (ref 3.5–5.3)
Sodium: 140 mmol/L (ref 135–146)
Total Bilirubin: 1 mg/dL (ref 0.2–1.2)
Total Protein: 6.8 g/dL (ref 6.1–8.1)
eGFR: 82 mL/min/{1.73_m2} (ref >=60)

## 2020-09-27 LAB — LIPID PANEL
Cholesterol: 90 mg/dL (ref <200)
HDL: 41 mg/dL (ref >=40)
LDL Cholesterol (Calc): 34 mg/dL (calc)
Non-HDL Cholesterol (Calc): 49 mg/dL (calc) (ref <130)
Total CHOL/HDL Ratio: 2.2 (calc) (ref <5.0)
Triglycerides: 70 mg/dL (ref <150)

## 2020-09-27 LAB — HEMOGLOBIN A1C
Hgb A1c MFr Bld: 6.8 % of total Hgb — ABNORMAL HIGH (ref <5.7)
Mean Plasma Glucose: 148 mg/dL
eAG (mmol/L): 8.2 mmol/L

## 2020-09-27 LAB — MICROALBUMIN / CREATININE URINE RATIO
Creatinine, Urine: 133 mg/dL (ref 20–320)
Microalb Creat Ratio: 9 mcg/mg creat (ref <30)
Microalb, Ur: 1.2 mg/dL

## 2020-09-28 ENCOUNTER — Encounter: Payer: Self-pay | Admitting: Internal Medicine

## 2020-09-28 ENCOUNTER — Telehealth: Payer: Self-pay | Admitting: Endocrinology

## 2020-09-28 ENCOUNTER — Ambulatory Visit (INDEPENDENT_AMBULATORY_CARE_PROVIDER_SITE_OTHER): Payer: Medicare Other | Admitting: Internal Medicine

## 2020-09-28 ENCOUNTER — Other Ambulatory Visit: Payer: Self-pay

## 2020-09-28 VITALS — BP 120/70 | HR 66 | Ht 65.0 in | Wt 184.0 lb

## 2020-09-28 DIAGNOSIS — E785 Hyperlipidemia, unspecified: Secondary | ICD-10-CM

## 2020-09-28 DIAGNOSIS — E1169 Type 2 diabetes mellitus with other specified complication: Secondary | ICD-10-CM | POA: Diagnosis not present

## 2020-09-28 DIAGNOSIS — Z6830 Body mass index (BMI) 30.0-30.9, adult: Secondary | ICD-10-CM | POA: Diagnosis not present

## 2020-09-28 DIAGNOSIS — I10 Essential (primary) hypertension: Secondary | ICD-10-CM

## 2020-09-28 DIAGNOSIS — E8881 Metabolic syndrome: Secondary | ICD-10-CM | POA: Diagnosis not present

## 2020-09-28 DIAGNOSIS — E669 Obesity, unspecified: Secondary | ICD-10-CM

## 2020-09-28 DIAGNOSIS — Z0000 Encounter for general adult medical examination without abnormal findings: Secondary | ICD-10-CM

## 2020-09-28 DIAGNOSIS — Z8673 Personal history of transient ischemic attack (TIA), and cerebral infarction without residual deficits: Secondary | ICD-10-CM | POA: Diagnosis not present

## 2020-09-28 LAB — POCT URINALYSIS DIPSTICK
Appearance: NEGATIVE
Bilirubin, UA: NEGATIVE
Blood, UA: NEGATIVE
Glucose, UA: POSITIVE — AB
Ketones, UA: NEGATIVE
Leukocytes, UA: NEGATIVE
Nitrite, UA: NEGATIVE
Odor: NEGATIVE
Protein, UA: NEGATIVE
Spec Grav, UA: 1.01 (ref 1.010–1.025)
Urobilinogen, UA: 0.2 E.U./dL
pH, UA: 6.5 (ref 5.0–8.0)

## 2020-09-28 MED ORDER — SYNJARDY XR 12.5-1000 MG PO TB24
2.0000 | ORAL_TABLET | Freq: Every day | ORAL | 3 refills | Status: DC
Start: 2020-09-28 — End: 2021-11-05

## 2020-09-28 NOTE — Telephone Encounter (Signed)
Rx sent to preferred pharmacy for 90 day supply.

## 2020-09-28 NOTE — Telephone Encounter (Signed)
Patient came into the office for a change in refill request. He is requesting that empagliflozin-metformin HCI er (SYNJARDY XR) 12.05-998 MG  be sent as a 90 day prescription because he insurance has a lower co pay for 90 days than 30 days.  Please send RX to [facility] on [address] [phone]

## 2020-09-28 NOTE — Progress Notes (Signed)
Subjective:    Patient ID: Mike Parker, male    DOB: [birth year], 70 y.o.   MRN: [id number]  HPI 70 year old Male seen for Medicare wellness, health maintenance exam and evaluation of medical issues.  Patient is enjoying retirement.  He and his wife now live in the [place] area.  Patient's mother-in-law who resides with them.  Patient has a history of diabetes mellitus which is under good control with the assistance of Dr. Lucianne Muss.  He has a history of essential hypertension, hyperlipidemia, obesity and metabolic syndrome.  He has lost some weight since February.  BMI is now 30.  He had a left thalamic CVA in June 2014.  2D echo at the time showed moderate LVH and diastolic dysfunction.  Renal artery duplex scan was negative for renal artery stenosis.  He recovered from the stroke but still has some paresthesias in his right hand.  He takes Ativan for insomnia which started after his stroke.  The stroke was very frightening for him.  His mother died of a stroke and it was disconcerting for him to have a stroke as well.  He was evaluated for sleep apnea several years ago.  Dr. Stann Mainland thought he just needed to lose weight and that would help.  He has lost some weight which is excellent.  He had a frozen shoulder previously treated by Dr. Margaree Mackintosh and this resolved several years ago.  No known drug allergies.  Social history: He is married.  He is retired from the [employer] where he worked for many years.  2 adult daughters and has grandchildren.  He does not smoke or consume alcohol.  Wife continues to work from home during the pandemic for [employer].  1 daughter resides in [place].    Review of Systems  Constitutional: Negative.   HENT: Negative.    Respiratory: Negative.    Cardiovascular: Negative.   Gastrointestinal: Negative.   Endocrine: Negative.   Genitourinary: Negative.   Musculoskeletal: Negative.   Neurological:  Negative for dizziness, tremors, syncope, facial  asymmetry, speech difficulty and weakness.  Psychiatric/Behavioral: Negative.        Objective:   Physical Exam His blood pressure is excellent 120/70 pulse 66 regular pulse oximetry 98% weight 184 pounds height 5 feet 5 inches BMI 30.62  Skin: Is warm and dry.  No cervical adenopathy.  No thyromegaly.  No carotid bruits.  Chest is clear to auscultation.  Cardiac exam: Regular rate and rhythm without ectopy or murmur.  Abdomen is soft nondistended without hepatosplenomegaly masses or tenderness.  Prostate: Normal without nodules.  No lower extremity pitting edema.  Neurological exam is intact.  His affect thought and judgment are normal.       Assessment & Plan:  Type 2 diabetes mellitus-stable under the care of Dr. Lucianne Muss and hemoglobin A1c is excellent at 6.8% and previously was 7.6% in May 2022.  Metabolic syndrome  Hyperlipidemia treated with atorvastatin and lipid panel is normal.  He has been able to raise his HDL to 41 and previously it was 38 last year.  History of left thalamic CVA with resultant paresthesia of hand but no weakness.  BMI 30-continue to work with diet and exercise  Immunizations: He has had 3 COVID vaccines, pneumococcal 13 and Prevnar 23.  Gets annual flu vaccine.  Will need tetanus immunization if he has an injury.  Medicare will not pay for it otherwise.  Plan: Continue current medications and follow-up in 6 months.   Subjective:  Patient presents for Medicare Annual/Subsequent preventive examination.  Review Past Medical/Family/Social: See above   Risk Factors  Current exercise habits: Exercises regularly Dietary issues discussed: Yes  Cardiac risk factors: Hyperlipidemia and diabetes mellitus  Depression Screen  (Note: if answer to either of the following is "Yes", a more complete depression screening is indicated)   Over the past two weeks, have you felt down, depressed or hopeless? No  Over the past two weeks, have you felt little  interest or pleasure in doing things? No Have you lost interest or pleasure in daily life? No Do you often feel hopeless? No Do you cry easily over simple problems? No   Activities of Daily Living  In your present state of health, do you have any difficulty performing the following activities?:   Driving? No  Managing money? No  Feeding yourself? No  Getting from bed to chair? No  Climbing a flight of stairs? No  Preparing food and eating?: No  Bathing or showering? No  Getting dressed: No  Getting to the toilet? No  Using the toilet:No  Moving around from place to place: No  In the past year have you fallen or had a near fall?:No  Are you sexually active?  Yes Do you have more than one partner? No   Hearing Difficulties: No  Do you often ask people to speak up or repeat themselves? No  Do you experience ringing or noises in your ears? No  Do you have difficulty understanding soft or whispered voices? No  Do you feel that you have a problem with memory?  Occasionally Do you often misplace items? No    Home Safety:  Do you have a smoke alarm at your residence? Yes Do you have grab bars in the bathroom?  No Do you have throw rugs in your house?  No   Cognitive Testing  Alert? Yes Normal Appearance?Yes  Oriented to person? Yes Place? Yes  Time? Yes  Recall of three objects? Yes  Can perform simple calculations? Yes  Displays appropriate judgment?Yes  Can read the correct time from a watch face?Yes   List the Names of Other Physician/Practitioners you currently use:  See referral list for the physicians patient is currently seeing.  Dr. Lucianne Muss   Review of Systems: See above   Objective:     General appearance: Appears younger than stated age  Head: Normocephalic, without obvious abnormality, atraumatic  Eyes: conj clear, EOMi PEERLA  Ears: normal TM's and external ear canals both ears  Nose: Nares normal. Septum midline. Mucosa normal. No drainage or sinus  tenderness.  Throat: lips, mucosa, and tongue normal; teeth and gums normal  Neck: no adenopathy, no carotid bruit, no JVD, supple, symmetrical, trachea midline and thyroid not enlarged, symmetric, no tenderness/mass/nodules  No CVA tenderness.  Lungs: clear to auscultation bilaterally  Breasts: normal appearance, no masses  Heart: regular rate and rhythm, S1, S2 normal, no murmur, click, rub or gallop  Abdomen: soft, non-tender; bowel sounds normal; no masses, no organomegaly  Musculoskeletal: ROM normal in all joints, no crepitus, no deformity, Normal muscle strengthen. Back  is symmetric, no curvature. Skin: Skin color, texture, turgor normal. No rashes or lesions  Lymph nodes: Cervical, supraclavicular, and axillary nodes normal.  Neurologic: CN 2 -12 Normal, Normal symmetric reflexes. Normal coordination and gait  Psych: Alert & Oriented x 3, Mood appear stable.    Assessment:    Annual wellness medicare exam   Plan:    During the course  of the visit the patient was educated and counseled about appropriate screening and preventive services including:   See above     Patient Instructions (the written plan) was given to the patient.  Medicare Attestation  I have personally reviewed:  The patient's medical and social history  Their use of alcohol, tobacco or illicit drugs  Their current medications and supplements  The patient's functional ability including ADLs,fall risks, home safety risks, cognitive, and hearing and visual impairment  Diet and physical activities  Evidence for depression or mood disorders  The patient's weight, height, BMI, and visual acuity have been recorded in the chart. I have made referrals, counseling, and provided education to the patient based on review of the above and I have provided the patient with a written personalized care plan for preventive services.

## 2020-09-28 NOTE — Patient Instructions (Addendum)
It was a pleasure to see you today.  Labs are stable.  Continue current medications and follow-up in 6 months with office visit, lipid panel, liver functions and Hemoglobin A1c.  Keep up the good work.

## 2020-11-08 DIAGNOSIS — Z23 Encounter for immunization: Secondary | ICD-10-CM | POA: Diagnosis not present

## 2020-12-26 DIAGNOSIS — Z20822 Contact with and (suspected) exposure to covid-19: Secondary | ICD-10-CM | POA: Diagnosis not present

## 2021-01-02 ENCOUNTER — Other Ambulatory Visit (INDEPENDENT_AMBULATORY_CARE_PROVIDER_SITE_OTHER): Payer: Medicare Other

## 2021-01-02 ENCOUNTER — Other Ambulatory Visit: Payer: Self-pay

## 2021-01-02 DIAGNOSIS — E669 Obesity, unspecified: Secondary | ICD-10-CM

## 2021-01-02 DIAGNOSIS — E1169 Type 2 diabetes mellitus with other specified complication: Secondary | ICD-10-CM | POA: Diagnosis not present

## 2021-01-02 LAB — BASIC METABOLIC PANEL
BUN: 12 mg/dL (ref 6–23)
CO2: 27 mEq/L (ref 19–32)
Calcium: 9.5 mg/dL (ref 8.4–10.5)
Chloride: 104 mEq/L (ref 96–112)
Creatinine, Ser: 0.99 mg/dL (ref 0.40–1.50)
GFR: 77.9 mL/min (ref >60.00)
Glucose, Bld: 103 mg/dL — ABNORMAL HIGH (ref 70–99)
Potassium: 4.1 mEq/L (ref 3.5–5.1)
Sodium: 142 mEq/L (ref 135–145)

## 2021-01-02 LAB — MICROALBUMIN / CREATININE URINE RATIO
Creatinine,U: 117.7 mg/dL
Microalb Creat Ratio: 1.3 mg/g (ref 0.0–30.0)
Microalb, Ur: 1.5 mg/dL (ref 0.0–1.9)

## 2021-01-02 LAB — HEMOGLOBIN A1C: Hgb A1c MFr Bld: 6.8 % — ABNORMAL HIGH (ref 4.6–6.5)

## 2021-01-05 ENCOUNTER — Encounter: Payer: Self-pay | Admitting: Endocrinology

## 2021-01-05 ENCOUNTER — Other Ambulatory Visit: Payer: Self-pay

## 2021-01-05 ENCOUNTER — Ambulatory Visit (INDEPENDENT_AMBULATORY_CARE_PROVIDER_SITE_OTHER): Payer: Medicare Other | Admitting: Endocrinology

## 2021-01-05 VITALS — BP 112/64 | HR 71 | Ht 65.0 in | Wt 181.4 lb

## 2021-01-05 DIAGNOSIS — E1165 Type 2 diabetes mellitus with hyperglycemia: Secondary | ICD-10-CM | POA: Diagnosis not present

## 2021-01-05 NOTE — Progress Notes (Signed)
Patient ID: Mike Parker, male   DOB: November 23, 1950, 70 y.o.   MRN: [id number]           Reason for Appointment: Follow-up for Type 2 Diabetes     History of Present Illness:          Date of diagnosis of type 2 diabetes mellitus: ?  2011        Background history:   He appears to have been on glipizide and Janumet for several years with fair control His A1c had been over 7% since 2015 He was referred here because of an A1c of 9.2% done in 6/17  Recent history:    Non-insulin hypoglycemic drugs: Synjardy 12.05/998, 2 tablets at lunch  Current management, blood sugar patterns and problems identified:  His A1c is stable at 6.8 He has had better control with Synjardy XR maximum doses of 12.05/998, previously A1c was 7.6 No GI side effects with this regimen and takes both tablets together Also however he says that occasionally he will take an extra metformin when he eats a large meal but may tend to have some diarrhea with this He has checked some blood sugars at all times including after supper and highest reading is 173 at night and 152 in the morning However he thinks that some of his high readings in the mornings are related to eating a snack at 2 in the morning  He has been walking periodically and has maintained his weight loss, down 3 pounds now  Lab fasting glucose was 103   Side effects from medications have been: Diarrhea from high-dose metformin  Compliance with the medical regimen: Fair  Glucose monitoring:  done 1 + times a day         Glucometer: One Touch Verio  Blood Glucose readings by download of meter:   PRE-MEAL Fasting Lunch Dinner Bedtime Overall  Glucose range: 104-152      Mean/median:     ?   POST-MEAL PC Breakfast PC Lunch PC Dinner  Glucose range:   88-173  Mean/median:      Previously:  PRE-MEAL Fasting Lunch Dinner Bedtime Overall  Glucose range:     91-188  Mean/median: 119    117   POST-MEAL PC Breakfast PC Lunch PC Dinner  Glucose  range:     Mean/median: 118 140 128     Self-care:  Meal times are:  Breakfast is skipped, lunch 12 noon, dinner usually 7-8 PM  Typical meal intake: Breakfast is near noon egg or meat/ cereal.   Dinner is chicken or pork, greens, corn; snacks usually nuts, fruits            Dietician visit, most recent: 8/17                Weight history:  Wt Readings from Last 3 Encounters:  01/05/21 181 lb 6.4 oz (82.3 kg)  09/28/20 184 lb (83.5 kg)  09/05/20 187 lb (84.8 kg)    Glycemic control:   Lab Results  Component Value Date   HGBA1C 6.8 (H) 01/02/2021   HGBA1C 6.8 (H) 09/26/2020   HGBA1C 7.3 (H) 08/31/2020   Lab Results  Component Value Date   MICROALBUR 1.5 01/02/2021   LDLCALC 34 09/26/2020   CREATININE 0.99 01/02/2021   Lab Results  Component Value Date   MICRALBCREAT 1.3 01/02/2021    Other problems discussed: See review of systems    Allergies as of 01/05/2021   No Known Allergies  Medication List        Accurate as of January 05, 2021  8:45 AM. If you have any questions, ask your nurse or doctor.          amLODipine 10 MG tablet Commonly known as: NORVASC TAKE 1 TABLET DAILY   aspirin 81 MG EC tablet Take 1 tablet (81 mg total) by mouth daily.   atorvastatin 10 MG tablet Commonly known as: LIPITOR TAKE 1 TABLET AT SUPPER FORLIPID LOWERING   blood glucose meter kit and supplies Dispense based on patient and insurance preference. Test twice daily; dx code: E11.9   cloNIDine 0.1 MG tablet Commonly known as: CATAPRES Take 1 tablet (0.1 mg total) by mouth 2 (two) times daily.   econazole nitrate 1 % cream Apply topically daily.   fluticasone 50 MCG/ACT nasal spray Commonly known as: FLONASE USE TWO SPRAY(S) IN EACH NOSTRIL ONCE DAILY   freestyle lancets   furosemide 40 MG tablet Commonly known as: LASIX TAKE 1 TABLET DAILY   losartan 100 MG tablet Commonly known as: COZAAR TAKE 1 TABLET DAILY   metFORMIN 500 MG 24 hr  tablet Commonly known as: GLUCOPHAGE-XR TAKE 1 TABLET DAILY WITH   SUPPER   nebivolol 10 MG tablet Commonly known as: BYSTOLIC TAKE 1 TABLET DAILY   OneTouch Verio Flex System w/Device Kit 1 each by Does not apply route daily. Use onetouch verio flex to check blood sugar once daily.   OneTouch Verio test strip Generic drug: glucose blood USE TO CHECK BLOOD SUGAR   ONCE DAILY   Synjardy XR 12.05-998 MG Tb24 Generic drug: Empagliflozin-metFORMIN HCl ER Take 2 tablets by mouth daily.        Allergies: No Known Allergies  Past Medical History:  Diagnosis Date   Allergy    Diabetes mellitus    Hypertension    Insomnia    LVH (left ventricular hypertrophy)    Stroke (cerebrum) ([place]) 2014    Past Surgical History:  Procedure Laterality Date   COLONOSCOPY     ELBOW SURGERY     rt elbow   HERNIA REPAIR     left inguinal   KNEE SURGERY     rt and left    Family History  Problem Relation Age of Onset   Stroke Mother    Hypertension Brother    Colon cancer Neg Hx    Diabetes Neg Hx     Social History:  reports that he has never smoked. He has never used smokeless tobacco. He reports that he does not drink alcohol and does not use drugs.   Review of Systems   Lipid history: On treatment long-term with Lipitor 10 mg daily from PCP    Lab Results  Component Value Date   CHOL 90 09/26/2020   HDL 41 09/26/2020   LDLCALC 34 09/26/2020   TRIG 70 09/26/2020   CHOLHDL 2.2 09/26/2020           Hypertension:Treated for >15 years and followed by PCP  He checks his blood pressure at home, recently about 120/70s  Also on 20 mg Lasix qod possibly for hypertension, no history of cardiomyopathy or edema   BP Readings from Last 3 Encounters:  01/05/21 112/64  09/28/20 120/70  09/05/20 138/80   Renal function consistently normal  Lab Results  Component Value Date   CREATININE 0.99 01/02/2021   CREATININE 1.00 09/26/2020   CREATININE 1.05 08/31/2020      Most recent eye exam was documented in 12/21  Most recent foot exam: 2/22  He has had his Covid vaccines  LABS:  Lab on 01/02/2021  Component Date Value Ref Range Status   Microalb, Ur 01/02/2021 1.5  0.0 - 1.9 mg/dL Final   Creatinine,U 01/02/2021 117.7  mg/dL Final   Microalb Creat Ratio 01/02/2021 1.3  0.0 - 30.0 mg/g Final   Sodium 01/02/2021 142  135 - 145 mEq/L Final   Potassium 01/02/2021 4.1  3.5 - 5.1 mEq/L Final   Chloride 01/02/2021 104  96 - 112 mEq/L Final   CO2 01/02/2021 27  19 - 32 mEq/L Final   Glucose, Bld 01/02/2021 103 (H)  70 - 99 mg/dL Final   BUN 01/02/2021 12  6 - 23 mg/dL Final   Creatinine, Ser 01/02/2021 0.99  0.40 - 1.50 mg/dL Final   GFR 01/02/2021 77.90  >60.00 mL/min Final   Calculated using the CKD-EPI Creatinine Equation (2021)   Calcium 01/02/2021 9.5  8.4 - 10.5 mg/dL Final   Hgb A1c MFr Bld 01/02/2021 6.8 (H)  4.6 - 6.5 % Final   Glycemic Control Guidelines for People with Diabetes:Non Diabetic:  <6%Goal of Therapy: <7%Additional Action Suggested:  >8%     Physical Examination:  BP 112/64 (BP Location: Left Arm, Patient Position: Sitting, Cuff Size: Normal)   Pulse 71   Ht 5' 5"  (1.651 m)   Wt 181 lb 6.4 oz (82.3 kg)   SpO2 98%   BMI 30.19 kg/m        ASSESSMENT:  Diabetes type 2 on oral agents  See history of present illness for detailed discussion of current diabetes management, blood sugar patterns and problems identified  His A1c is stable at 6.8 although from our lab it was previously 7.3  Has tolerated taking Synjardy 12.05/998, 2 tablets a day without GI side effect Weight is slightly better He is doing some walking also Only rarely will have high normal blood sugar after dinner but likely has good readings in the mornings except when he is eating during the night  HYPERTENSION: well controlled  Microalbumin normal   PLAN:  No change in [place] Discussed that since he is getting a therapeutic dose of  metformin and the [place] he does not need to take any extra metformin Does need to stay consistent with walking or other exercise indoors during winter  He will alternate fasting and postprandial readings on his monitoring  There are no Patient Instructions on file for this visit.      Elayne Snare 01/05/2021, 8:45 AM   Note: This office note was prepared with Dragon voice recognition system technology. Any transcriptional errors that result from this process are unintentional.

## 2021-01-19 DIAGNOSIS — H35413 Lattice degeneration of retina, bilateral: Secondary | ICD-10-CM | POA: Diagnosis not present

## 2021-01-19 DIAGNOSIS — H25813 Combined forms of age-related cataract, bilateral: Secondary | ICD-10-CM | POA: Diagnosis not present

## 2021-01-19 DIAGNOSIS — H18513 Endothelial corneal dystrophy, bilateral: Secondary | ICD-10-CM | POA: Diagnosis not present

## 2021-01-19 DIAGNOSIS — E119 Type 2 diabetes mellitus without complications: Secondary | ICD-10-CM | POA: Diagnosis not present

## 2021-01-19 LAB — HM DIABETES EYE EXAM

## 2021-02-16 ENCOUNTER — Other Ambulatory Visit: Payer: Self-pay

## 2021-02-16 DIAGNOSIS — E119 Type 2 diabetes mellitus without complications: Secondary | ICD-10-CM

## 2021-02-16 DIAGNOSIS — E785 Hyperlipidemia, unspecified: Secondary | ICD-10-CM

## 2021-02-16 DIAGNOSIS — Z8673 Personal history of transient ischemic attack (TIA), and cerebral infarction without residual deficits: Secondary | ICD-10-CM

## 2021-02-16 DIAGNOSIS — I10 Essential (primary) hypertension: Secondary | ICD-10-CM

## 2021-02-16 MED ORDER — CLONIDINE HCL 0.1 MG PO TABS: 0.1000 mg | ORAL_TABLET | Freq: Two times a day (BID) | ORAL | 1 refills | Status: DC

## 2021-03-27 ENCOUNTER — Other Ambulatory Visit: Payer: Self-pay

## 2021-03-27 ENCOUNTER — Other Ambulatory Visit: Payer: Medicare Other | Admitting: Internal Medicine

## 2021-03-27 DIAGNOSIS — E1169 Type 2 diabetes mellitus with other specified complication: Secondary | ICD-10-CM | POA: Diagnosis not present

## 2021-03-27 DIAGNOSIS — E785 Hyperlipidemia, unspecified: Secondary | ICD-10-CM

## 2021-03-27 DIAGNOSIS — E119 Type 2 diabetes mellitus without complications: Secondary | ICD-10-CM

## 2021-03-28 LAB — HEPATIC FUNCTION PANEL
AG Ratio: 1.9 (calc) (ref 1.0–2.5)
ALT: 19 U/L (ref 9–46)
AST: 17 U/L (ref 10–35)
Albumin: 4.4 g/dL (ref 3.6–5.1)
Alkaline phosphatase (APISO): 56 U/L (ref 35–144)
Bilirubin, Direct: 0.3 mg/dL — ABNORMAL HIGH (ref 0.0–0.2)
Globulin: 2.3 g/dL (calc) (ref 1.9–3.7)
Indirect Bilirubin: 0.7 mg/dL (calc) (ref 0.2–1.2)
Total Bilirubin: 1 mg/dL (ref 0.2–1.2)
Total Protein: 6.7 g/dL (ref 6.1–8.1)

## 2021-03-28 LAB — LIPID PANEL
Cholesterol: 81 mg/dL (ref <200)
HDL: 40 mg/dL (ref >=40)
LDL Cholesterol (Calc): 26 mg/dL (calc)
Non-HDL Cholesterol (Calc): 41 mg/dL (calc) (ref <130)
Total CHOL/HDL Ratio: 2 (calc) (ref <5.0)
Triglycerides: 66 mg/dL (ref <150)

## 2021-03-28 LAB — HEMOGLOBIN A1C
Hgb A1c MFr Bld: 6.7 % of total Hgb — ABNORMAL HIGH (ref <5.7)
Mean Plasma Glucose: 146 mg/dL
eAG (mmol/L): 8.1 mmol/L

## 2021-03-29 ENCOUNTER — Other Ambulatory Visit: Payer: Self-pay

## 2021-03-29 ENCOUNTER — Ambulatory Visit (INDEPENDENT_AMBULATORY_CARE_PROVIDER_SITE_OTHER): Payer: Medicare Other | Admitting: Internal Medicine

## 2021-03-29 ENCOUNTER — Encounter: Payer: Self-pay | Admitting: Internal Medicine

## 2021-03-29 VITALS — BP 112/68 | HR 67 | Temp 97.9°F | Ht 65.0 in | Wt 184.2 lb

## 2021-03-29 DIAGNOSIS — I10 Essential (primary) hypertension: Secondary | ICD-10-CM

## 2021-03-29 DIAGNOSIS — E785 Hyperlipidemia, unspecified: Secondary | ICD-10-CM

## 2021-03-29 DIAGNOSIS — E8881 Metabolic syndrome: Secondary | ICD-10-CM | POA: Diagnosis not present

## 2021-03-29 DIAGNOSIS — E1169 Type 2 diabetes mellitus with other specified complication: Secondary | ICD-10-CM

## 2021-03-29 DIAGNOSIS — Z6830 Body mass index (BMI) 30.0-30.9, adult: Secondary | ICD-10-CM

## 2021-03-29 DIAGNOSIS — Z8673 Personal history of transient ischemic attack (TIA), and cerebral infarction without residual deficits: Secondary | ICD-10-CM

## 2021-03-29 NOTE — Progress Notes (Incomplete)
° °  Subjective:    Patient ID: Mike Parker, male    DOB: [birth year], 71 y.o.   MRN: [id number]  HPI    Review of Systems had eye exam Dr. Dione Booze     Objective:   Physical Exam        Assessment & Plan:

## 2021-04-25 NOTE — Patient Instructions (Signed)
Please have Tdap vaccine at pharmacy.  Continue current medications and and follow-up for health maintenance exam and Medicare wellness visit in 6 months.  Continue diet and exercise efforts.  It was a pleasure to see you today. ?

## 2021-05-31 ENCOUNTER — Other Ambulatory Visit: Payer: Self-pay | Admitting: Internal Medicine

## 2021-06-01 ENCOUNTER — Other Ambulatory Visit (INDEPENDENT_AMBULATORY_CARE_PROVIDER_SITE_OTHER): Payer: Medicare Other

## 2021-06-01 DIAGNOSIS — E1165 Type 2 diabetes mellitus with hyperglycemia: Secondary | ICD-10-CM

## 2021-06-01 LAB — BASIC METABOLIC PANEL
BUN: 14 mg/dL (ref 6–23)
CO2: 27 mEq/L (ref 19–32)
Calcium: 9.1 mg/dL (ref 8.4–10.5)
Chloride: 103 mEq/L (ref 96–112)
Creatinine, Ser: 0.99 mg/dL (ref 0.40–1.50)
GFR: 77.67 mL/min (ref >60.00)
Glucose, Bld: 124 mg/dL — ABNORMAL HIGH (ref 70–99)
Potassium: 4 mEq/L (ref 3.5–5.1)
Sodium: 140 mEq/L (ref 135–145)

## 2021-06-05 ENCOUNTER — Encounter: Payer: Self-pay | Admitting: Endocrinology

## 2021-06-05 ENCOUNTER — Ambulatory Visit (INDEPENDENT_AMBULATORY_CARE_PROVIDER_SITE_OTHER): Payer: Medicare Other | Admitting: Endocrinology

## 2021-06-05 VITALS — BP 140/80 | HR 66 | Ht 65.0 in | Wt 185.2 lb

## 2021-06-05 DIAGNOSIS — E1169 Type 2 diabetes mellitus with other specified complication: Secondary | ICD-10-CM | POA: Diagnosis not present

## 2021-06-05 DIAGNOSIS — E669 Obesity, unspecified: Secondary | ICD-10-CM | POA: Diagnosis not present

## 2021-06-05 NOTE — Patient Instructions (Signed)
Check blood sugars on waking up 2-3 days a week  Also check blood sugars about 2 hours after meals and do this after different meals by rotation  Recommended blood sugar levels on waking up are 90-130 and about 2 hours after meal is 130-160  Please bring your blood sugar monitor to each visit, thank you   

## 2021-06-05 NOTE — Progress Notes (Signed)
Patient ID: Mike Parker, male   DOB: 11-15-1950, 71 y.o.   MRN: [id number]           Reason for Appointment: Follow-up for Type 2 Diabetes     History of Present Illness:          Date of diagnosis of type 2 diabetes mellitus: ?  2011        Background history:   He appears to have been on glipizide and Janumet for several years with fair control His A1c had been over 7% since 2015 He was referred here because of an A1c of 9.2% done in 6/17  Recent history:    Non-insulin hypoglycemic drugs: Synjardy 12.05/998, 2 tablets at lunch  Current management, blood sugar patterns and problems identified:  His A1c is stable at 6.7 This was done by PCP 2 months ago  He has had excellent control with Synjardy XR as before and he is taking this regularly with 1 tablet at lunch and 1 at dinner now Blood sugars at home are being checked more consistently at different times although usually not before breakfast May have some increase in blood sugars in the mornings, lab glucose 124 Only rarely based on his diet his sugars may be over 170 postprandially in the evening Although he thinks he is trying to start back on his walking program and his weight has gone up slightly since last year when he was last seen in December Overall diet has been good  Side effects from medications have been: Diarrhea from high-dose metformin  Compliance with the medical regimen: Fair  Glucose monitoring:  done 1 + times a day         Glucometer: One Touch Verio  Blood Glucose readings by download of meter:   PRE-MEAL Fasting Lunch Dinner Bedtime Overall  Glucose range:     99-182  Mean/median:  121   128   POST-MEAL PC Breakfast PC Lunch PC Dinner  Glucose range:   99-182  Mean/median:   140    Previously:  PRE-MEAL Fasting Lunch Dinner Bedtime Overall  Glucose range: 104-152      Mean/median:     ?   POST-MEAL PC Breakfast PC Lunch PC Dinner  Glucose range:   88-173  Mean/median:         Self-care:  Meal times are:  Breakfast is skipped, lunch 12 noon, dinner usually 7-8 PM  Typical meal intake: Breakfast is near noon egg or meat/ cereal.   Dinner is chicken or pork, greens, corn; snacks usually nuts, fruits            Dietician visit, most recent: 8/17                Weight history:  Wt Readings from Last 3 Encounters:  06/05/21 185 lb 3.2 oz (84 kg)  03/29/21 184 lb 4 oz (83.6 kg)  01/05/21 181 lb 6.4 oz (82.3 kg)    Glycemic control:   Lab Results  Component Value Date   HGBA1C 6.7 (H) 03/27/2021   HGBA1C 6.8 (H) 01/02/2021   HGBA1C 6.8 (H) 09/26/2020   Lab Results  Component Value Date   MICROALBUR 1.5 01/02/2021   LDLCALC 26 03/27/2021   CREATININE 0.99 06/01/2021   Lab Results  Component Value Date   MICRALBCREAT 1.3 01/02/2021    Other problems discussed: See review of systems    Allergies as of 06/05/2021   No Known Allergies      Medication List  Accurate as of Jun 05, 2021 11:59 PM. If you have any questions, ask your nurse or doctor.          amLODipine 10 MG tablet Commonly known as: NORVASC TAKE 1 TABLET DAILY   aspirin 81 MG EC tablet Take 1 tablet (81 mg total) by mouth daily.   atorvastatin 10 MG tablet Commonly known as: LIPITOR TAKE 1 TABLET AT SUPPER FORLIPID LOWERING   blood glucose meter kit and supplies Dispense based on patient and insurance preference. Test twice daily; dx code: E11.9   cloNIDine 0.1 MG tablet Commonly known as: CATAPRES Take 1 tablet (0.1 mg total) by mouth 2 (two) times daily.   econazole nitrate 1 % cream Apply topically daily.   fluticasone 50 MCG/ACT nasal spray Commonly known as: FLONASE USE TWO SPRAY(S) IN EACH NOSTRIL ONCE DAILY   freestyle lancets   losartan 100 MG tablet Commonly known as: COZAAR TAKE 1 TABLET DAILY   nebivolol 10 MG tablet Commonly known as: BYSTOLIC TAKE 1 TABLET DAILY   OneTouch Verio Flex System w/Device Kit 1 each by Does not  apply route daily. Use onetouch verio flex to check blood sugar once daily.   OneTouch Verio test strip Generic drug: glucose blood USE TO CHECK BLOOD SUGAR   ONCE DAILY   Synjardy XR 12.05-998 MG Tb24 Generic drug: Empagliflozin-metFORMIN HCl ER Take 2 tablets by mouth daily.        Allergies: No Known Allergies  Past Medical History:  Diagnosis Date   Allergy    Diabetes mellitus    Hypertension    Insomnia    LVH (left ventricular hypertrophy)    Stroke (cerebrum) ([place]) 2014    Past Surgical History:  Procedure Laterality Date   COLONOSCOPY     ELBOW SURGERY     rt elbow   HERNIA REPAIR     left inguinal   KNEE SURGERY     rt and left    Family History  Problem Relation Age of Onset   Stroke Mother    Hypertension Brother    Colon cancer Neg Hx    Diabetes Neg Hx     Social History:  reports that he has never smoked. He has never used smokeless tobacco. He reports that he does not drink alcohol and does not use drugs.   Review of Systems   Lipid history: On treatment long-term with Lipitor 10 mg daily from PCP    Lab Results  Component Value Date   CHOL 81 03/27/2021   HDL 40 03/27/2021   LDLCALC 26 03/27/2021   TRIG 66 03/27/2021   CHOLHDL 2.0 03/27/2021           Hypertension:Treated for >15 years and followed by PCP  He checks his blood pressure at home, recently about 120/70s  Also on 20 mg Lasix qod possibly for hypertension, no history of cardiomyopathy or edema   BP Readings from Last 3 Encounters:  06/05/21 140/80  03/29/21 112/68  01/05/21 112/64   Renal function consistently normal  Lab Results  Component Value Date   CREATININE 0.99 06/01/2021   CREATININE 0.99 01/02/2021   CREATININE 1.00 09/26/2020     Most recent eye exam was documented in 12/21  Most recent foot exam: 2/22  He has had his Covid vaccines  LABS:  Lab on 06/01/2021  Component Date Value Ref Range Status   Sodium 06/01/2021 140  135 - 145  mEq/L Final   Potassium 06/01/2021 4.0  3.5 -  5.1 mEq/L Final   Chloride 06/01/2021 103  96 - 112 mEq/L Final   CO2 06/01/2021 27  19 - 32 mEq/L Final   Glucose, Bld 06/01/2021 124 (H)  70 - 99 mg/dL Final   BUN 06/01/2021 14  6 - 23 mg/dL Final   Creatinine, Ser 06/01/2021 0.99  0.40 - 1.50 mg/dL Final   GFR 06/01/2021 77.67  >60.00 mL/min Final   Calculated using the CKD-EPI Creatinine Equation (2021)   Calcium 06/01/2021 9.1  8.4 - 10.5 mg/dL Final    Physical Examination:  BP 140/80   Pulse 66   Ht 5' 5"  (1.651 m)   Wt 185 lb 3.2 oz (84 kg)   SpO2 98%   BMI 30.82 kg/m        ASSESSMENT:  Diabetes type 2 on oral agents  See history of present illness for detailed discussion of current diabetes management, blood sugar patterns and problems identified  His A1c is stable at 6.7 done recently  Blood sugars are excellent with taking Synjardy 12.05/998, 2 tablets a day No GI problems with this Blood sugar readings as above at home He is aware of what changes to make in his diet and need for regular exercise  HYPERTENSION: well controlled    PLAN:  Continue same regimen of Synjardy but he can take both tablets at dinnertime He can check his fasting blood sugars more often Discussed blood sugar targets at various times Make sure he is trying to walk for exercise more regularly   Patient Instructions  Check blood sugars on waking up 2-3 days a week  Also check blood sugars about 2 hours after meals and do this after different meals by rotation  Recommended blood sugar levels on waking up are 90-130 and about 2 hours after meal is 130-160  Please bring your blood sugar monitor to each visit, thank you      Elayne Snare 06/06/2021, 11:08 AM   Note: This office note was prepared with Dragon voice recognition system technology. Any transcriptional errors that result from this process are unintentional.

## 2021-06-23 ENCOUNTER — Other Ambulatory Visit: Payer: Self-pay | Admitting: Internal Medicine

## 2021-06-25 ENCOUNTER — Other Ambulatory Visit: Payer: Self-pay | Admitting: Internal Medicine

## 2021-07-09 ENCOUNTER — Other Ambulatory Visit: Payer: Self-pay

## 2021-07-09 DIAGNOSIS — E119 Type 2 diabetes mellitus without complications: Secondary | ICD-10-CM

## 2021-07-09 DIAGNOSIS — I10 Essential (primary) hypertension: Secondary | ICD-10-CM

## 2021-07-09 DIAGNOSIS — E785 Hyperlipidemia, unspecified: Secondary | ICD-10-CM

## 2021-07-09 DIAGNOSIS — Z8673 Personal history of transient ischemic attack (TIA), and cerebral infarction without residual deficits: Secondary | ICD-10-CM

## 2021-07-09 MED ORDER — CLONIDINE HCL 0.1 MG PO TABS: 0.1000 mg | ORAL_TABLET | Freq: Two times a day (BID) | ORAL | 0 refills | Status: DC

## 2021-07-17 NOTE — Progress Notes (Signed)
Cardiology Office Note    Date:  07/25/2021   ID:  Donielle, Radziewicz 1950/09/23, MRN [id number]   PCP:  Elby Showers, MD   [place]  Cardiologist:  Ena Dawley, MD   Advanced Practice Provider:  No care team member to display Electrophysiologist:  None   [phone]   Chief Complaint  Patient presents with   Follow-up    History of Present Illness:  Mike Parker is a 71 y.o. male with a hx of HTN, DMII, prior CVA in 2014, and HLD.  Patient last saw Dr. Johney Frame 05/2020 and was doing well.  Patient comes in for f/u. Denies chest pain, dyspnea, dizziness, edema. 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 Works in yard daily. Walks 1 mile 5 days/week.     Past Medical History:  Diagnosis Date   Allergy    Diabetes mellitus    Hypertension    Insomnia    LVH (left ventricular hypertrophy)    Stroke (cerebrum) ([facility]) 2014    Past Surgical History:  Procedure Laterality Date   COLONOSCOPY     ELBOW SURGERY     rt elbow   HERNIA REPAIR     left inguinal   KNEE SURGERY     rt and left    Current Medications: Current Meds  Medication Sig   amLODipine (NORVASC) 10 MG tablet TAKE 1 TABLET DAILY   aspirin EC 81 MG EC tablet Take 1 tablet (81 mg total) by mouth daily.   atorvastatin (LIPITOR) 10 MG tablet TAKE 1 TABLET AT SUPPER FORLIPID LOWERING   blood glucose meter kit and supplies Dispense based on patient and insurance preference. Test twice daily; dx code: E11.9   Blood Glucose Monitoring Suppl ([place]) w/Device KIT 1 each by Does not apply route daily. Use onetouch verio flex to check blood sugar once daily.   cloNIDine (CATAPRES) 0.1 MG tablet Take 1 tablet (0.1 mg total) by mouth 2 (two) times daily.   econazole nitrate 1 % cream Apply topically daily.   Empagliflozin-metFORMIN HCl ER (SYNJARDY XR) 12.05-998 MG TB24 Take 2 tablets by mouth daily.   fluticasone  (FLONASE) 50 MCG/ACT nasal spray USE TWO SPRAY(S) IN EACH NOSTRIL ONCE DAILY   glucose blood (ONETOUCH VERIO) test strip USE TO CHECK BLOOD SUGAR   ONCE DAILY   Lancets (FREESTYLE) lancets    losartan (COZAAR) 100 MG tablet TAKE 1 TABLET DAILY   nebivolol (BYSTOLIC) 10 MG tablet TAKE 1 TABLET DAILY     Allergies:   Patient has no known allergies.   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[employer]  Tobacco Use   Smoking status: Never   Smokeless tobacco: Never  Vaping Use   Vaping Use: Never used  Substance and Sexual Activity   Alcohol use: No   Drug use: No   Sexual activity: Yes  Other Topics Concern   Not on file  Social History Narrative   Not on file   Social Determinants of Health   Financial Resource Strain: Not on file  Food Insecurity: Not on file  Transportation Needs: Not on file  Physical Activity: Not on file  Stress: Not on file  Social Connections: Not on file     Family History:  The patient's  family history includes Hypertension in his brother; Stroke in his mother.   ROS:   Please see the history of present illness.    ROS All other systems reviewed and are negative.   PHYSICAL EXAM:   VS:  BP 122/78   Pulse 61   Ht _0  (1.651 m)   Wt 183 lb (83 kg)   SpO2 97%   BMI 30.45 kg/m   Physical Exam  GEN: Well nourished, well developed, in no acute distress  Neck: no JVD, carotid bruits, or masses Cardiac:RRR; no murmurs, rubs, or gallops  Respiratory:  clear to auscultation bilaterally, normal work of breathing GI: soft, nontender, nondistended, + BS Ext: without cyanosis, clubbing, or edema, Good distal pulses bilaterally Neuro:  Alert and Oriented x 3, Psych: euthymic mood, full affect  Wt Readings from Last 3 Encounters:  07/25/21 183 lb (83 kg)   06/05/21 185 lb 3.2 oz (84 kg)  03/29/21 184 lb 4 oz (83.6 kg)      Studies/Labs Reviewed:   EKG:  EKG is  ordered today.  The ekg ordered today demonstrates NSR, normal EKG  Recent Labs: 09/26/2020: Hemoglobin 15.2; Platelets 219 03/27/2021: ALT 19 06/01/2021: BUN 14; Creatinine, Ser 0.99; Potassium 4.0; Sodium 140   Lipid Panel    Component Value Date/Time   CHOL 81 03/27/2021 0914   TRIG 66 03/27/2021 0914   HDL 40 03/27/2021 0914   CHOLHDL 2.0 03/27/2021 0914   VLDL 19 07/23/2016 1011   LDLCALC 26 03/27/2021 0914    Additional studies/ records that were reviewed today include:  2D echo 6/17/14Study Conclusions  - Left ventricle: The cavity size was normal. There was    moderate concentric hypertrophy. Systolic function was    normal. The estimated ejection fraction was in the range    of 60% to 65%. Wall motion was normal; there were no    regional wall motion abnormalities.  - Left atrium: The atrium was mildly dilated.  Transthoracic echocardiography.  M-mode, complete 2D,  spectral Doppler, and color Doppler.  Height:  Height:  165.1cm. Height: 65in.  Weight:  Weight: 96.2kg. Weight:  211.6lb.  Body mass index:  BMI: 35.3kg/m^2.  Body surface  area:    BSA: 2.62m2.  Blood pressure:     161/88.  Patient  status:  Inpatient.  Location:  Bedside.   ----------  Normal renal artery duplex 02/02/13   Carotid Dopplers 2014 Summary:  No significant extracranial carotid artery stenosis  demonstrated. Vertebrals are patent with antegrade flow.     Risk Assessment/Calculations:         ASSESSMENT:    1. Essential hypertension   2. Hyperlipidemia, unspecified hyperlipidemia type   3. History of CVA (cerebrovascular accident)   4. Type 2 diabetes mellitus without complication, without long-term current use of insulin (HCC)      PLAN:  In order of problems listed above:  Essential hypertension(history of resistent) echo 2014 normal LVEF with moderate LVH-BP  well controlled on amlodipine 10 mg, clonidine 0.1 mg bid, losartan 1562mg daily, bystolic 10 mg daily.   Hyperlipidemia LDL  26 02/2021 on lipitor  History of traumatic stroke 06/2012 blood pressures elevated around the time of the stroke. Renal artery duplex and carotid Dopplers normal in 2014  DM type II followed by endocrine A1C 6.7 in Feb  Shared Decision Making/Informed Consent        Medication  Adjustments/Labs and Tests Ordered: Current medicines are reviewed at length with the patient today.  Concerns regarding medicines are outlined above.  Medication changes, Labs and Tests ordered today are listed in the Patient Instructions below. Patient Instructions  Medication Instructions:  Your physician recommends that you continue on your current medications as directed. Please refer to the Current Medication list given to you today.  *If you need a refill on your cardiac medications before your next appointment, please call your pharmacy*   Lab Work: None If you have labs (blood work) drawn today and your tests are completely normal, you will receive your results only by: [facility] (if you have MyChart) OR A paper copy in the mail If you have any lab test that is abnormal or we need to change your treatment, we will call you to review the results.  Follow-Up: At [facility], you and your health needs are our priority.  As part of our continuing mission to provide you with exceptional heart care, we have created designated Provider Care Teams.  These Care Teams include your primary Cardiologist (physician) and Advanced Practice Providers (APPs -  Physician Assistants and Nurse Practitioners) who all work together to provide you with the care you need, when you need it.  Your next appointment:   1 year(s)  The format for your next appointment:   In Person  Provider:   Gwyndolyn Kaufman, MD   Other Instructions Your provider recommends that you maintain 150 minutes  per week of moderate aerobic activity.       Sumner Boast, PA-C  07/25/2021 9:35 AM    [facility] [address], [place], [place]  [address] Phone: [phone]; Fax: [phone]

## 2021-07-25 ENCOUNTER — Encounter: Payer: Self-pay | Admitting: Physician Assistant

## 2021-07-25 ENCOUNTER — Ambulatory Visit (INDEPENDENT_AMBULATORY_CARE_PROVIDER_SITE_OTHER): Payer: Medicare Other | Admitting: Physician Assistant

## 2021-07-25 VITALS — BP 122/78 | HR 61 | Ht 65.0 in | Wt 183.0 lb

## 2021-07-25 DIAGNOSIS — E785 Hyperlipidemia, unspecified: Secondary | ICD-10-CM | POA: Diagnosis not present

## 2021-07-25 DIAGNOSIS — Z8673 Personal history of transient ischemic attack (TIA), and cerebral infarction without residual deficits: Secondary | ICD-10-CM

## 2021-07-25 DIAGNOSIS — I10 Essential (primary) hypertension: Secondary | ICD-10-CM

## 2021-07-25 DIAGNOSIS — E119 Type 2 diabetes mellitus without complications: Secondary | ICD-10-CM

## 2021-07-25 NOTE — Patient Instructions (Signed)
Medication Instructions:  Your physician recommends that you continue on your current medications as directed. Please refer to the Current Medication list given to you today.  *If you need a refill on your cardiac medications before your next appointment, please call your pharmacy*   Lab Work: None If you have labs (blood work) drawn today and your tests are completely normal, you will receive your results only by: MyChart Message (if you have MyChart) OR A paper copy in the mail If you have any lab test that is abnormal or we need to change your treatment, we will call you to review the results.  Follow-Up: At [facility], you and your health needs are our priority.  As part of our continuing mission to provide you with exceptional heart care, we have created designated Provider Care Teams.  These Care Teams include your primary Cardiologist (physician) and Advanced Practice Providers (APPs -  Physician Assistants and Nurse Practitioners) who all work together to provide you with the care you need, when you need it.  Your next appointment:   1 year(s)  The format for your next appointment:   In Person  Provider:   Laurance Flatten, MD   Other Instructions Your provider recommends that you maintain 150 minutes per week of moderate aerobic activity.

## 2021-08-31 ENCOUNTER — Other Ambulatory Visit (INDEPENDENT_AMBULATORY_CARE_PROVIDER_SITE_OTHER): Payer: Medicare Other

## 2021-08-31 DIAGNOSIS — E669 Obesity, unspecified: Secondary | ICD-10-CM | POA: Diagnosis not present

## 2021-08-31 DIAGNOSIS — E1169 Type 2 diabetes mellitus with other specified complication: Secondary | ICD-10-CM

## 2021-08-31 LAB — BASIC METABOLIC PANEL
BUN: 14 mg/dL (ref 6–23)
CO2: 25 mEq/L (ref 19–32)
Calcium: 9.3 mg/dL (ref 8.4–10.5)
Chloride: 103 mEq/L (ref 96–112)
Creatinine, Ser: 0.96 mg/dL (ref 0.40–1.50)
GFR: 80.45 mL/min (ref >60.00)
Glucose, Bld: 89 mg/dL (ref 70–99)
Potassium: 4.1 mEq/L (ref 3.5–5.1)
Sodium: 140 mEq/L (ref 135–145)

## 2021-08-31 LAB — HEMOGLOBIN A1C: Hgb A1c MFr Bld: 7 % — ABNORMAL HIGH (ref 4.6–6.5)

## 2021-09-02 ENCOUNTER — Other Ambulatory Visit: Payer: Self-pay | Admitting: Endocrinology

## 2021-09-05 ENCOUNTER — Ambulatory Visit (INDEPENDENT_AMBULATORY_CARE_PROVIDER_SITE_OTHER): Payer: Medicare Other | Admitting: Endocrinology

## 2021-09-05 ENCOUNTER — Encounter: Payer: Self-pay | Admitting: Endocrinology

## 2021-09-05 VITALS — BP 118/64 | HR 68 | Ht 65.0 in | Wt 179.6 lb

## 2021-09-05 DIAGNOSIS — E1165 Type 2 diabetes mellitus with hyperglycemia: Secondary | ICD-10-CM | POA: Diagnosis not present

## 2021-09-05 NOTE — Patient Instructions (Signed)
Check blood sugars on waking up 2-3 days a week  Also check blood sugars about 2 hours after meals and do this after different meals by rotation  Recommended blood sugar levels on waking up are 90-130 and about 2 hours after meal is 130-160  Please bring your blood sugar monitor to each visit, thank you   

## 2021-09-05 NOTE — Progress Notes (Unsigned)
Patient ID: Mike Parker, male   DOB: March 14, 1950, 71 y.o.   MRN: [id number]           Reason for Appointment: Follow-up for Type 2 Diabetes     History of Present Illness:          Date of diagnosis of type 2 diabetes mellitus: ?  2011        Background history:   He appears to have been on glipizide and Janumet for several years with fair control His A1c had been over 7% since 2015 He was referred here because of an A1c of 9.2% done in 6/17  Recent history:    Non-insulin hypoglycemic drugs: Synjardy 12.05/998, 2 tablets at lunch  Current management, blood sugar patterns and problems identified:  His A1c is stable at 6.7 This was done by PCP 2 months ago  He has had excellent control with Synjardy XR as before and he is taking this regularly with 1 tablet at lunch and 1 at dinner now Blood sugars at home are being checked more consistently at different times although usually not before breakfast May have some increase in blood sugars in the mornings, lab glucose 124 Only rarely based on his diet his sugars may be over 170 postprandially in the evening start back on his walking program and his weight has gone up slightly since last year when he was last seen in December Overall diet has been good  Side effects from medications have been: Diarrhea from high-dose metformin  Compliance with the medical regimen: Fair  Glucose monitoring:  done 1 + times a day         Glucometer: One Touch Verio  Blood Glucose readings by download of meter:   PRE-MEAL Fasting Lunch Dinner Bedtime Overall  Glucose range: 112-141      Mean/median:        POST-MEAL PC Breakfast PC Lunch PC Dinner  Glucose range:   188  Mean/median:      Prior  PRE-MEAL Fasting Lunch Dinner Bedtime Overall  Glucose range:     99-182  Mean/median:  121   128   POST-MEAL PC Breakfast PC Lunch PC Dinner  Glucose range:   99-182  Mean/median:   140    Self-care:  Meal times are:  Breakfast is  skipped, lunch 12 noon, dinner usually 7-8 PM  Typical meal intake: Breakfast is near noon egg or meat/ cereal.   Dinner is chicken or pork, greens, corn; snacks usually nuts, fruits            Dietician visit, most recent: 8/17                Weight history:  Wt Readings from Last 3 Encounters:  09/05/21 179 lb 9.6 oz (81.5 kg)  07/25/21 183 lb (83 kg)  06/05/21 185 lb 3.2 oz (84 kg)    Glycemic control:   Lab Results  Component Value Date   HGBA1C 7.0 (H) 08/31/2021   HGBA1C 6.7 (H) 03/27/2021   HGBA1C 6.8 (H) 01/02/2021   Lab Results  Component Value Date   MICROALBUR 1.5 01/02/2021   LDLCALC 26 03/27/2021   CREATININE 0.96 08/31/2021   Lab Results  Component Value Date   MICRALBCREAT 1.3 01/02/2021    Other problems discussed: See review of systems    Allergies as of 09/05/2021   No Known Allergies      Medication List        Accurate as of September 05, 2021  9:03 AM. If you have any questions, ask your nurse or doctor.          amLODipine 10 MG tablet Commonly known as: NORVASC TAKE 1 TABLET DAILY   aspirin EC 81 MG tablet Take 1 tablet (81 mg total) by mouth daily.   atorvastatin 10 MG tablet Commonly known as: LIPITOR TAKE 1 TABLET AT SUPPER FORLIPID LOWERING   blood glucose meter kit and supplies Dispense based on patient and insurance preference. Test twice daily; dx code: E11.9   cloNIDine 0.1 MG tablet Commonly known as: CATAPRES Take 1 tablet (0.1 mg total) by mouth 2 (two) times daily.   econazole nitrate 1 % cream Apply topically daily.   fluticasone 50 MCG/ACT nasal spray Commonly known as: FLONASE USE TWO SPRAY(S) IN EACH NOSTRIL ONCE DAILY   freestyle lancets   losartan 100 MG tablet Commonly known as: COZAAR TAKE 1 TABLET DAILY   nebivolol 10 MG tablet Commonly known as: BYSTOLIC TAKE 1 TABLET DAILY   OneTouch Verio Flex System w/Device Kit 1 each by Does not apply route daily. Use onetouch verio flex to check  blood sugar once daily.   OneTouch Verio test strip Generic drug: glucose blood USE TO CHECK BLOOD SUGAR   ONCE DAILY   Synjardy XR 12.05-998 MG Tb24 Generic drug: Empagliflozin-metFORMIN HCl ER Take 2 tablets by mouth daily.        Allergies: No Known Allergies  Past Medical History:  Diagnosis Date   Allergy    Diabetes mellitus    Hypertension    Insomnia    LVH (left ventricular hypertrophy)    Stroke (cerebrum) ([place]) 2014    Past Surgical History:  Procedure Laterality Date   COLONOSCOPY     ELBOW SURGERY     rt elbow   HERNIA REPAIR     left inguinal   KNEE SURGERY     rt and left    Family History  Problem Relation Age of Onset   Stroke Mother    Hypertension Brother    Colon cancer Neg Hx    Diabetes Neg Hx     Social History:  reports that he has never smoked. He has never used smokeless tobacco. He reports that he does not drink alcohol and does not use drugs.   Review of Systems   Lipid history: On treatment long-term with Lipitor 10 mg daily from PCP    Lab Results  Component Value Date   CHOL 81 03/27/2021   HDL 40 03/27/2021   LDLCALC 26 03/27/2021   TRIG 66 03/27/2021   CHOLHDL 2.0 03/27/2021           Hypertension:Treated for >15 years and followed by PCP  He checks his blood pressure at home, recently about 120/70s  Also on 20 mg Lasix qod possibly for hypertension, no history of cardiomyopathy or edema   BP Readings from Last 3 Encounters:  09/05/21 118/64  07/25/21 122/78  06/05/21 140/80   Renal function consistently normal  Lab Results  Component Value Date   CREATININE 0.96 08/31/2021   CREATININE 0.99 06/01/2021   CREATININE 0.99 01/02/2021     Most recent eye exam was documented in 12/21  Most recent foot exam: 8/23  He has had his Covid vaccines  LABS:  Lab on 08/31/2021  Component Date Value Ref Range Status   Sodium 08/31/2021 140  135 - 145 mEq/L Final   Potassium 08/31/2021 4.1  3.5 - 5.1  mEq/L Final  Chloride 08/31/2021 103  96 - 112 mEq/L Final   CO2 08/31/2021 25  19 - 32 mEq/L Final   Glucose, Bld 08/31/2021 89  70 - 99 mg/dL Final   BUN 08/31/2021 14  6 - 23 mg/dL Final   Creatinine, Ser 08/31/2021 0.96  0.40 - 1.50 mg/dL Final   GFR 08/31/2021 80.45  >60.00 mL/min Final   Calculated using the CKD-EPI Creatinine Equation (2021)   Calcium 08/31/2021 9.3  8.4 - 10.5 mg/dL Final   Hgb A1c MFr Bld 08/31/2021 7.0 (H)  4.6 - 6.5 % Final   Glycemic Control Guidelines for People with Diabetes:Non Diabetic:  <6%Goal of Therapy: <7%Additional Action Suggested:  >8%     Physical Examination:  BP 118/64   Pulse 68   Ht 5' 5"  (1.651 m)   Wt 179 lb 9.6 oz (81.5 kg)   SpO2 97%   BMI 29.89 kg/m     Diabetic Foot Exam - Simple   Simple Foot Form Diabetic Foot exam was performed with the following findings: Yes   Visual Inspection No deformities, no ulcerations, no other skin breakdown bilaterally: Yes Sensation Testing Intact to touch and monofilament testing bilaterally: Yes Pulse Check Posterior Tibialis and Dorsalis pulse intact bilaterally: Yes Comments        ASSESSMENT:  Diabetes type 2 on oral agents  See history of present illness for detailed discussion of current diabetes management, blood sugar patterns and problems identified  His A1c is 7  Blood sugars are excellent with taking Synjardy 12.05/998, 2 tablets a day No GI problems with this Blood sugar readings as above at home He is aware of what changes to make in his diet and need for regular exercise  HYPERTENSION: well controlled    PLAN:  Continue same regimen of Synjardy but he can take both tablets at dinnertime He can check his fasting blood sugars more often Discussed blood sugar targets at various times Make sure he is trying to walk for exercise more regularly   There are no Patient Instructions on file for this visit.      Elayne Snare 09/05/2021, 9:03 AM   Note: This office  note was prepared with Dragon voice recognition system technology. Any transcriptional errors that result from this process are unintentional.

## 2021-09-21 ENCOUNTER — Other Ambulatory Visit: Payer: Self-pay | Admitting: *Deleted

## 2021-09-21 DIAGNOSIS — E785 Hyperlipidemia, unspecified: Secondary | ICD-10-CM

## 2021-09-21 DIAGNOSIS — Z8673 Personal history of transient ischemic attack (TIA), and cerebral infarction without residual deficits: Secondary | ICD-10-CM

## 2021-09-21 DIAGNOSIS — E119 Type 2 diabetes mellitus without complications: Secondary | ICD-10-CM

## 2021-09-21 DIAGNOSIS — I10 Essential (primary) hypertension: Secondary | ICD-10-CM

## 2021-09-21 MED ORDER — CLONIDINE HCL 0.1 MG PO TABS: 0.1000 mg | ORAL_TABLET | Freq: Two times a day (BID) | ORAL | 3 refills | Status: DC

## 2021-09-27 ENCOUNTER — Other Ambulatory Visit: Payer: Medicare Other

## 2021-09-27 DIAGNOSIS — Z8673 Personal history of transient ischemic attack (TIA), and cerebral infarction without residual deficits: Secondary | ICD-10-CM

## 2021-09-27 DIAGNOSIS — E119 Type 2 diabetes mellitus without complications: Secondary | ICD-10-CM | POA: Diagnosis not present

## 2021-09-27 DIAGNOSIS — G4733 Obstructive sleep apnea (adult) (pediatric): Secondary | ICD-10-CM

## 2021-09-27 DIAGNOSIS — I10 Essential (primary) hypertension: Secondary | ICD-10-CM | POA: Diagnosis not present

## 2021-09-27 DIAGNOSIS — R200 Anesthesia of skin: Secondary | ICD-10-CM | POA: Diagnosis not present

## 2021-09-27 DIAGNOSIS — R3912 Poor urinary stream: Secondary | ICD-10-CM

## 2021-09-27 DIAGNOSIS — E1169 Type 2 diabetes mellitus with other specified complication: Secondary | ICD-10-CM

## 2021-09-27 DIAGNOSIS — Z6831 Body mass index (BMI) 31.0-31.9, adult: Secondary | ICD-10-CM

## 2021-09-27 DIAGNOSIS — Z0000 Encounter for general adult medical examination without abnormal findings: Secondary | ICD-10-CM

## 2021-09-27 DIAGNOSIS — R202 Paresthesia of skin: Secondary | ICD-10-CM | POA: Diagnosis not present

## 2021-09-27 DIAGNOSIS — Z125 Encounter for screening for malignant neoplasm of prostate: Secondary | ICD-10-CM | POA: Diagnosis not present

## 2021-09-27 DIAGNOSIS — E8881 Metabolic syndrome: Secondary | ICD-10-CM | POA: Diagnosis not present

## 2021-09-27 DIAGNOSIS — E785 Hyperlipidemia, unspecified: Secondary | ICD-10-CM | POA: Diagnosis not present

## 2021-09-27 DIAGNOSIS — E669 Obesity, unspecified: Secondary | ICD-10-CM | POA: Diagnosis not present

## 2021-09-28 LAB — CBC WITH DIFFERENTIAL/PLATELET
Absolute Monocytes: 490 cells/uL (ref 200–950)
Basophils Absolute: 40 cells/uL (ref 0–200)
Basophils Relative: 0.7 %
Eosinophils Absolute: 200 cells/uL (ref 15–500)
Eosinophils Relative: 3.5 %
HCT: 49.2 % (ref 38.5–50.0)
Hemoglobin: 15.8 g/dL (ref 13.2–17.1)
Lymphs Abs: 1317 cells/uL (ref 850–3900)
MCH: 26.6 pg — ABNORMAL LOW (ref 27.0–33.0)
MCHC: 32.1 g/dL (ref 32.0–36.0)
MCV: 83 fL (ref 80.0–100.0)
MPV: 10.7 fL (ref 7.5–12.5)
Monocytes Relative: 8.6 %
Neutro Abs: 3654 cells/uL (ref 1500–7800)
Neutrophils Relative %: 64.1 %
Platelets: 233 10*3/uL (ref 140–400)
RBC: 5.93 10*6/uL — ABNORMAL HIGH (ref 4.20–5.80)
RDW: 13.2 % (ref 11.0–15.0)
Total Lymphocyte: 23.1 %
WBC: 5.7 10*3/uL (ref 3.8–10.8)

## 2021-09-28 LAB — LIPID PANEL
Cholesterol: 87 mg/dL (ref <200)
HDL: 42 mg/dL (ref >=40)
LDL Cholesterol (Calc): 30 mg/dL (calc)
Non-HDL Cholesterol (Calc): 45 mg/dL (calc) (ref <130)
Total CHOL/HDL Ratio: 2.1 (calc) (ref <5.0)
Triglycerides: 74 mg/dL (ref <150)

## 2021-09-28 LAB — COMPLETE METABOLIC PANEL WITH GFR
AG Ratio: 2 (calc) (ref 1.0–2.5)
ALT: 15 U/L (ref 9–46)
AST: 15 U/L (ref 10–35)
Albumin: 4.5 g/dL (ref 3.6–5.1)
Alkaline phosphatase (APISO): 60 U/L (ref 35–144)
BUN: 16 mg/dL (ref 7–25)
CO2: 27 mmol/L (ref 20–32)
Calcium: 9.7 mg/dL (ref 8.6–10.3)
Chloride: 103 mmol/L (ref 98–110)
Creat: 0.93 mg/dL (ref 0.70–1.35)
Globulin: 2.2 g/dL (calc) (ref 1.9–3.7)
Glucose, Bld: 77 mg/dL (ref 65–99)
Potassium: 4.4 mmol/L (ref 3.5–5.3)
Sodium: 140 mmol/L (ref 135–146)
Total Bilirubin: 1 mg/dL (ref 0.2–1.2)
Total Protein: 6.7 g/dL (ref 6.1–8.1)
eGFR: 89 mL/min/{1.73_m2} (ref >=60)

## 2021-09-28 LAB — PSA: PSA: 1.54 ng/mL (ref <=4.00)

## 2021-10-02 ENCOUNTER — Encounter: Payer: Self-pay | Admitting: Internal Medicine

## 2021-10-02 ENCOUNTER — Ambulatory Visit (INDEPENDENT_AMBULATORY_CARE_PROVIDER_SITE_OTHER): Payer: Medicare Other | Admitting: Internal Medicine

## 2021-10-02 VITALS — BP 134/80 | HR 70 | Temp 97.8°F | Ht 65.0 in | Wt 177.8 lb

## 2021-10-02 DIAGNOSIS — E1169 Type 2 diabetes mellitus with other specified complication: Secondary | ICD-10-CM | POA: Diagnosis not present

## 2021-10-02 DIAGNOSIS — Z0000 Encounter for general adult medical examination without abnormal findings: Secondary | ICD-10-CM

## 2021-10-02 DIAGNOSIS — Z8673 Personal history of transient ischemic attack (TIA), and cerebral infarction without residual deficits: Secondary | ICD-10-CM

## 2021-10-02 DIAGNOSIS — I10 Essential (primary) hypertension: Secondary | ICD-10-CM

## 2021-10-02 DIAGNOSIS — E785 Hyperlipidemia, unspecified: Secondary | ICD-10-CM

## 2021-10-02 DIAGNOSIS — Z6829 Body mass index (BMI) 29.0-29.9, adult: Secondary | ICD-10-CM

## 2021-10-02 DIAGNOSIS — E1165 Type 2 diabetes mellitus with hyperglycemia: Secondary | ICD-10-CM | POA: Diagnosis not present

## 2021-10-02 LAB — POCT URINALYSIS DIPSTICK
Bilirubin, UA: NEGATIVE
Blood, UA: NEGATIVE
Glucose, UA: POSITIVE — AB
Ketones, UA: NEGATIVE
Leukocytes, UA: NEGATIVE
Nitrite, UA: NEGATIVE
Protein, UA: NEGATIVE
Spec Grav, UA: 1.015 (ref 1.010–1.025)
Urobilinogen, UA: 0.2 E.U./dL
pH, UA: 5 (ref 5.0–8.0)

## 2021-10-02 NOTE — Progress Notes (Signed)
Annual Wellness Visit     Patient: Mike Parker, Male    DOB: November 08, 1950, 71 y.o.   MRN: [id number] Visit Date: 10/02/2021  Chief Complaint  Patient presents with   Medicare Wellness   Subjective    Mike Parker is a 71 y.o. male who presents today for his Annual Wellness Visit.  HPI He is also seen for health maintenance exam and evaluation of medical issues.  He has a history of diabetes mellitus which is under good control with Dr. Lucianne Muss.  He has essential hypertension, hyperlipidemia, obesity and metabolic syndrome.  Has been able to lose weight and his BMI is excellent at 29.59  His labs are reviewed including fasting glucose, BUN, creatinine, liver functions, PSA and lipid panel all of which are within normal limits.  Patient had annual  eye exam December 2022 by Dr. Sallye Lat.  Recommended immunizations discussed.  May get tetanus updated at pharmacy.  He has had pneumococcal 13 and pneumococcal 23 vaccines.  Recommend flu vaccine and COVID booster.  Records indicate he has not had Shingrix series but did have Zostavax vaccine in 2016.  He had colonoscopy in 2017.  He was evaluated for sleep apnea several years ago with Dr. Stann Mainland thought he just needed to lose weight.  He has lost some weight.  He had history of frozen shoulder previously treated by Dr. Margaree Mackintosh and this resolved several years ago.  No known drug allergies.  He had a left thalamic CVA in June 2014.  2D echo at the time showed moderate LVH and diastolic dysfunction.  Renal artery duplex scan was negative for renal artery stenosis.  He recovered from the stroke but still has some paresthesias in his right hand.  Social history: He is married.  He retired from the [employer] where he worked for many years.  2 adult daughters and has grandchildren.  He does not smoke or consume alcohol.  Mother-in-law resides in his home.  She has some dementia.   Family history: Mother died of a stroke.  2  daughters in good health.     Review of Systems-feels well with no new complaints   Objective    Vitals: BP 134/80   Pulse 70   Temp 97.8 F (36.6 C) (Tympanic)   Ht 5\' 5"  (1.651 m)   Wt 177 lb 12.8 oz (80.6 kg)   SpO2 98%   BMI 29.59 kg/m   Physical Exam Skin: Warm and dry.  No cervical adenopathy, thyromegaly or carotid bruits.  Chest is clear to auscultation without rales or wheezing.  Cardiac exam: Regular rate and rhythm without murmur or ectopy.  Abdomen is soft nondistended without hepatosplenomegaly masses or tenderness.  Prostate: Without nodules.  No lower extremity pitting edema.  Brief neurological exam is intact without gross focal deficits.  Most recent functional status assessment:    10/02/2021   10:51 AM  In your present state of health, do you have any difficulty performing the following activities:  Hearing? 0  Vision? 0  Difficulty concentrating or making decisions? 0  Walking or climbing stairs? 0  Dressing or bathing? 0  Doing errands, shopping? 0  Preparing Food and eating ? N  Using the Toilet? N  In the past six months, have you accidently leaked urine? N  Do you have problems with loss of bowel control? N  Managing your Medications? N  Managing your Finances? N  Housekeeping or managing your Housekeeping? N   Most  recent fall risk assessment:    10/02/2021   10:50 AM  Fall Risk   Falls in the past year? 0  Number falls in past yr: 0  Injury with Fall? 0  Risk for fall due to : No Fall Risks  Follow up Falls evaluation completed    Most recent depression screenings:    10/02/2021   10:50 AM 09/28/2020   10:03 AM  PHQ 2/9 Scores  PHQ - 2 Score 0 0   Most recent cognitive screening:    10/02/2021   10:52 AM  6CIT Screen  What Year? 0 points  What month? 0 points  What time? 0 points  Count back from 20 0 points  Months in reverse 0 points  Repeat phrase 0 points  Total Score 0 points       Assessment & Plan   History of left  thalamic CVA June 2014 with very little residual except some paresthesias in his right hand  Type 2 diabetes mellitus followed by Dr. Lucianne Muss and stable  Essential hypertension stable on current regimen  Hyperlipidemia-treated with statin  BMI-29.5  Plan: I am very pleased with his lab work and his weight at this point.  He was congratulated on his progress.  Immunizations discussed.  Colonoscopy is up-to-date.  PSA is normal.  He will return in 6 months for follow-up and will continue close follow-up with Endocrinology.          Annual wellness visit done today including the all of the following: Reviewed patient's Family Medical History Reviewed and updated list of patient's medical providers Assessment of cognitive impairment was done Assessed patient's functional ability Established a written schedule for health screening services Health Risk Assessent Completed and Reviewed  Discussed health benefits of physical activity, and encouraged him to engage in regular exercise appropriate for his age and condition.         IMargaree Mackintosh, MD, have reviewed all documentation for this visit. The documentation on 10/08/21 for the exam, diagnosis, procedures, and orders are all accurate and complete.   LaVon Philipp Deputy, CMA

## 2021-10-08 NOTE — Patient Instructions (Signed)
It was a pleasure to see you today.  Your blood pressure control is excellent and your labs are entirely stable.  Vaccines discussed today.  Follow-up here in 6 months.  Continue diet and exercise regimen.

## 2021-11-02 ENCOUNTER — Other Ambulatory Visit: Payer: Self-pay | Admitting: Endocrinology

## 2021-11-02 DIAGNOSIS — E1169 Type 2 diabetes mellitus with other specified complication: Secondary | ICD-10-CM

## 2022-01-07 ENCOUNTER — Other Ambulatory Visit (INDEPENDENT_AMBULATORY_CARE_PROVIDER_SITE_OTHER): Payer: Medicare Other

## 2022-01-07 DIAGNOSIS — E1165 Type 2 diabetes mellitus with hyperglycemia: Secondary | ICD-10-CM | POA: Diagnosis not present

## 2022-01-07 LAB — MICROALBUMIN / CREATININE URINE RATIO
Creatinine,U: 105.1 mg/dL
Microalb Creat Ratio: 1.6 mg/g (ref 0.0–30.0)
Microalb, Ur: 1.7 mg/dL (ref 0.0–1.9)

## 2022-01-07 LAB — BASIC METABOLIC PANEL
BUN: 14 mg/dL (ref 6–23)
CO2: 28 mEq/L (ref 19–32)
Calcium: 9.4 mg/dL (ref 8.4–10.5)
Chloride: 102 mEq/L (ref 96–112)
Creatinine, Ser: 0.89 mg/dL (ref 0.40–1.50)
GFR: 87.01 mL/min (ref >60.00)
Glucose, Bld: 106 mg/dL — ABNORMAL HIGH (ref 70–99)
Potassium: 3.9 mEq/L (ref 3.5–5.1)
Sodium: 138 mEq/L (ref 135–145)

## 2022-01-07 LAB — HEMOGLOBIN A1C: Hgb A1c MFr Bld: 7 % — ABNORMAL HIGH (ref 4.6–6.5)

## 2022-01-09 ENCOUNTER — Ambulatory Visit (INDEPENDENT_AMBULATORY_CARE_PROVIDER_SITE_OTHER): Payer: Medicare Other | Admitting: Endocrinology

## 2022-01-09 VITALS — BP 118/64 | HR 74 | Ht 65.0 in | Wt 178.0 lb

## 2022-01-09 DIAGNOSIS — I10 Essential (primary) hypertension: Secondary | ICD-10-CM | POA: Diagnosis not present

## 2022-01-09 DIAGNOSIS — E669 Obesity, unspecified: Secondary | ICD-10-CM

## 2022-01-09 DIAGNOSIS — E1169 Type 2 diabetes mellitus with other specified complication: Secondary | ICD-10-CM

## 2022-01-09 NOTE — Progress Notes (Signed)
Patient ID: Mike Parker, male   DOB: Jun 02, 1950, 71 y.o.   MRN: [id number]           Reason for Appointment: Follow-up for Type 2 Diabetes     History of Present Illness:          Date of diagnosis of type 2 diabetes mellitus: ?  2011        Background history:   He appears to have been on glipizide and Janumet for several years with fair control His A1c had been over 7% since 2015 He was referred here because of an A1c of 9.2% done in 6/17  Recent history:    Non-insulin hypoglycemic drugs: Synjardy 12.05/998, 2 tablets at lunch  Current management, blood sugar patterns and problems identified:  His A1c is 7, previously 7 also   He did bring his monitor for download He has done better with his diet since his last visit Blood sugars look fairly evenly controlled and he is monitoring consistently at various times Has only 1 unusually high reading of 257 otherwise blood sugars are about 160 at the maximum after meals As before his fasting readings are mildly increased  He was told to take his Synjardy together in the evening  No side effects with this and renal function is stable He does try to walk 4 to 5 days a week  Side effects from medications have been: Diarrhea from high-dose metformin  Compliance with the medical regimen: Fair  Glucose monitoring:  done 1 + times a day         Glucometer: One Touch Verio  Blood Glucose readings by download   PRE-MEAL Fasting Lunch Dinner Bedtime Overall  Glucose range: 92-141      Mean/median: 130    123   POST-MEAL PC Breakfast PC Lunch PC Dinner  Glucose range:   86-162  Mean/median:   123   Previously:   PRE-MEAL Fasting Lunch Dinner Bedtime Overall  Glucose range: 112-141      Mean/median:        POST-MEAL PC Breakfast PC Lunch PC Dinner  Glucose range:   188  Mean/median:        Self-care:  Meal times are:  Breakfast is skipped, lunch 12 noon, dinner usually 7-8 PM  Typical meal intake: Breakfast is  near noon egg or meat/ cereal.   Dinner is chicken or pork, greens, corn; snacks usually nuts, fruits            Dietician visit, most recent: 8/17                Weight history:  Wt Readings from Last 3 Encounters:  01/09/22 178 lb (80.7 kg)  10/02/21 177 lb 12.8 oz (80.6 kg)  09/05/21 179 lb 9.6 oz (81.5 kg)    Glycemic control:   Lab Results  Component Value Date   HGBA1C 7.0 (H) 01/07/2022   HGBA1C 7.0 (H) 08/31/2021   HGBA1C 6.7 (H) 03/27/2021   Lab Results  Component Value Date   MICROALBUR 1.7 01/07/2022   LDLCALC 30 09/27/2021   CREATININE 0.89 01/07/2022   Lab Results  Component Value Date   MICRALBCREAT 1.6 01/07/2022    Other problems discussed: See review of systems    Allergies as of 01/09/2022   No Known Allergies      Medication List        Accurate as of January 09, 2022  4:07 PM. If you have any questions, ask your nurse or  doctor.          amLODipine 10 MG tablet Commonly known as: NORVASC TAKE 1 TABLET DAILY   aspirin EC 81 MG tablet Take 1 tablet (81 mg total) by mouth daily.   atorvastatin 10 MG tablet Commonly known as: LIPITOR TAKE 1 TABLET AT SUPPER FORLIPID LOWERING   blood glucose meter kit and supplies Dispense based on patient and insurance preference. Test twice daily; dx code: E11.9   cloNIDine 0.1 MG tablet Commonly known as: CATAPRES Take 1 tablet (0.1 mg total) by mouth 2 (two) times daily.   econazole nitrate 1 % cream Apply topically daily.   fluticasone 50 MCG/ACT nasal spray Commonly known as: FLONASE USE TWO SPRAY(S) IN EACH NOSTRIL ONCE DAILY   freestyle lancets   losartan 100 MG tablet Commonly known as: COZAAR TAKE 1 TABLET DAILY   nebivolol 10 MG tablet Commonly known as: BYSTOLIC TAKE 1 TABLET DAILY   OneTouch Verio Flex System w/Device Kit 1 each by Does not apply route daily. Use onetouch verio flex to check blood sugar once daily.   OneTouch Verio test strip Generic drug: glucose  blood USE TO CHECK BLOOD SUGAR   ONCE DAILY   Synjardy XR 12.05-998 MG Tb24 Generic drug: Empagliflozin-metFORMIN HCl ER Take 2 tablets by mouth once daily        Allergies: No Known Allergies  Past Medical History:  Diagnosis Date   Allergy    Diabetes mellitus    Hypertension    Insomnia    LVH (left ventricular hypertrophy)    Stroke (cerebrum) ([place]) 2014    Past Surgical History:  Procedure Laterality Date   COLONOSCOPY     ELBOW SURGERY     rt elbow   HERNIA REPAIR     left inguinal   KNEE SURGERY     rt and left    Family History  Problem Relation Age of Onset   Stroke Mother    Hypertension Brother    Colon cancer Neg Hx    Diabetes Neg Hx     Social History:  reports that he has never smoked. He has never used smokeless tobacco. He reports that he does not drink alcohol and does not use drugs.   Review of Systems   Lipid history: On treatment long-term with Lipitor 10 mg daily from PCP    Lab Results  Component Value Date   CHOL 87 09/27/2021   HDL 42 09/27/2021   LDLCALC 30 09/27/2021   TRIG 74 09/27/2021   CHOLHDL 2.1 09/27/2021           Hypertension:Treated for >15 years and followed by PCP  He checks his blood pressure at home, recently about 112-118/70s  Has been prescribed Lasix also, no history of cardiomyopathy or edema Blood pressure was checked a second time standing  BP Readings from Last 3 Encounters:  01/09/22 118/64  10/02/21 134/80  09/05/21 118/64   Renal function consistently normal  Lab Results  Component Value Date   CREATININE 0.89 01/07/2022   CREATININE 0.93 09/27/2021   CREATININE 0.96 08/31/2021     Most recent eye exam was documented in 12/22  Most recent foot exam: 8/23  He has had his Covid vaccines  LABS:  Lab on 01/07/2022  Component Date Value Ref Range Status   Sodium 01/07/2022 138  135 - 145 mEq/L Final   Potassium 01/07/2022 3.9  3.5 - 5.1 mEq/L Final   Chloride 01/07/2022 102  96  - 112 mEq/L  Final   CO2 01/07/2022 28  19 - 32 mEq/L Final   Glucose, Bld 01/07/2022 106 (H)  70 - 99 mg/dL Final   BUN 01/07/2022 14  6 - 23 mg/dL Final   Creatinine, Ser 01/07/2022 0.89  0.40 - 1.50 mg/dL Final   GFR 01/07/2022 87.01  >60.00 mL/min Final   Calculated using the CKD-EPI Creatinine Equation (2021)   Calcium 01/07/2022 9.4  8.4 - 10.5 mg/dL Final   Hgb A1c MFr Bld 01/07/2022 7.0 (H)  4.6 - 6.5 % Final   Glycemic Control Guidelines for People with Diabetes:Non Diabetic:  <6%Goal of Therapy: <7%Additional Action Suggested:  >8%    Microalb, Ur 01/07/2022 1.7  0.0 - 1.9 mg/dL Final   Creatinine,U 01/07/2022 105.1  mg/dL Final   Microalb Creat Ratio 01/07/2022 1.6  0.0 - 30.0 mg/g Final    Physical Examination:  BP 118/64 (Patient Position: Standing)   Pulse 74   Ht _0  (1.651 m)   Wt 178 lb (80.7 kg)   SpO2 97%   BMI 29.62 kg/m       ASSESSMENT:  Diabetes type 2 on oral agents  See history of present illness for detailed discussion of current diabetes management, blood sugar patterns and problems identified  His A1c is 7 This is relatively higher than expected for his home sugars  Blood sugars are excellent at home with taking Synjardy 12.05/998, 2 tablets a day Recent average blood sugar at home 123 including postprandial reading Weight is stable  Renal function normal Urine microalbumin normal, explained interpretation of the result  HYPERTENSION: well controlled and no orthostatic symptoms even though systolic blood pressure is relatively low He can continue to monitor at home and follow-up with PCP as scheduled   PLAN:  Continue same regimen of Synjardy 2 tablets and can take this either in the evening or morning He will continue monitoring blood sugars at various times and let us know if they are consistently high Continue exercise and prudent diet    There are no Patient Instructions on file for this visit.      Elayne Snare 01/09/2022, 4:07  PM   Note: This office note was prepared with Dragon voice recognition system technology. Any transcriptional errors that result from this process are unintentional.

## 2022-01-25 ENCOUNTER — Encounter: Payer: Self-pay | Admitting: Internal Medicine

## 2022-01-25 DIAGNOSIS — H35413 Lattice degeneration of retina, bilateral: Secondary | ICD-10-CM | POA: Diagnosis not present

## 2022-01-25 DIAGNOSIS — E119 Type 2 diabetes mellitus without complications: Secondary | ICD-10-CM | POA: Diagnosis not present

## 2022-01-25 DIAGNOSIS — H25813 Combined forms of age-related cataract, bilateral: Secondary | ICD-10-CM | POA: Diagnosis not present

## 2022-01-25 DIAGNOSIS — H18513 Endothelial corneal dystrophy, bilateral: Secondary | ICD-10-CM | POA: Diagnosis not present

## 2022-01-25 LAB — HM DIABETES EYE EXAM

## 2022-02-01 ENCOUNTER — Encounter: Payer: Self-pay | Admitting: Endocrinology

## 2022-03-19 ENCOUNTER — Other Ambulatory Visit: Payer: Self-pay

## 2022-03-19 MED ORDER — NEBIVOLOL HCL 10 MG PO TABS: 10.0000 mg | ORAL_TABLET | Freq: Every day | ORAL | 3 refills | Status: DC

## 2022-03-29 NOTE — Progress Notes (Signed)
Patient Care Team: Elby Showers, MD as PCP - General (Internal Medicine) Dorothy Spark, MD as PCP - Cardiology (Cardiology)  Visit Date: 04/05/22  Subjective:    Patient ID: Mike Parker , Male   DOB: September 16, 1950, 72 y.o.    MRN: [id number]   72 y.o. Male presents today for a 6 month follow-up. Patient has a past medical history of allergic rhinitis, Type 2 Diabetes mellitus, essential HTN, hyperlipidemia,metabolic syndrome.  Reports feeling well regarding general health.  History of Type 2 diabetes mellitus treated with Synjardy XR 12.05-998 mg two tablets daily. HGBA1c at 6.9% on 04/02/22. He has been walking frequently. He is followed by Endocrinologist, Dr. Dwyane Dee.  Patient is active and has kept his weight under control.BMI is 30.09 and was 29.59 in September 2023. Enjoys walking for exercise.  History of  essential hypertension treated with Cozaar 100 mg daily, Norvasc 10 mg daily, Bystolic 10 mg daily, Catapres 0.1 mg twice daily. Blood pressure normal today at 130/68.  History of hyperlipidemia treated with Lipitor 10 mg with dinner. Lipid panel excellent  on 04/02/22.LDL is 40. Total cholesterol is 96. Triglycerides are 69.  Reports he had his  annual diabetic eye exam on 01/18/22.   Past Medical History:  Diagnosis Date   Allergy    Diabetes mellitus    Hypertension    Insomnia    LVH (left ventricular hypertrophy)    Stroke (cerebrum) ([facility]) 2014     Family History  Problem Relation Age of Onset   Stroke Mother    Hypertension Brother    Colon cancer Neg Hx    Diabetes Neg Hx     Social Hx: retired from [employer]. Married. 2 adult daughters and has grandchildren. Mother-in- law resides with patient and his wife.     Review of Systems  Constitutional:  Negative for fever and malaise/fatigue.  HENT:  Negative for congestion.   Eyes:  Negative for blurred vision.  Respiratory:  Negative for cough and shortness of breath.   Cardiovascular:   Negative for chest pain, palpitations and leg swelling.  Gastrointestinal:  Negative for vomiting.  Musculoskeletal:  Negative for back pain.  Skin:  Negative for rash.  Neurological:  Negative for loss of consciousness and headaches.        Objective:   Vitals: BP 130/68   Pulse 67   Temp 98.2 F (36.8 C) (Tympanic)   Ht 5\' 5"  (1.651 m)   Wt 180 lb 12.8 oz (82 kg)   SpO2 99%   BMI 30.09 kg/m    Physical Exam Constitutional:      General: He is not in acute distress.    Appearance: Normal appearance. He is not ill-appearing.  HENT:     Head: Normocephalic and atraumatic.  Neck:     Thyroid: No thyroid mass, thyromegaly or thyroid tenderness.     Vascular: No carotid bruit.  Cardiovascular:     Rate and Rhythm: Normal rate and regular rhythm.     Pulses:          Dorsalis pedis pulses are 1+ on the right side and 1+ on the left side.       Posterior tibial pulses are 1+ on the right side and 1+ on the left side.     Heart sounds: Normal heart sounds. No murmur heard.    No friction rub. No gallop.  Pulmonary:     Effort: Pulmonary effort is normal. No respiratory distress.  Breath sounds: Normal breath sounds. No wheezing or rales.  Feet:     Comments: Feet warm to touch. Sensation intact bilaterally. No lesions. Lymphadenopathy:     Cervical: No cervical adenopathy.  Skin:    General: Skin is warm and dry.  Neurological:     Mental Status: He is alert and oriented to person, place, and time. Mental status is at baseline.  Psychiatric:        Mood and Affect: Mood normal.        Behavior: Behavior normal.        Thought Content: Thought content normal.        Judgment: Judgment normal.       Results:   Studies obtained and personally reviewed by me:   Labs:       Component Value Date/Time   NA 138 01/07/2022 0822   K 3.9 01/07/2022 0822   CL 102 01/07/2022 0822   CO2 28 01/07/2022 0822   GLUCOSE 106 (H) 01/07/2022 0822   BUN 14 01/07/2022  0822   CREATININE 0.89 01/07/2022 0822   CREATININE 0.93 09/27/2021 0933   CALCIUM 9.4 01/07/2022 0822   PROT 6.5 04/02/2022 0938   ALBUMIN 4.5 09/08/2018 0838   AST 14 04/02/2022 0938   ALT 17 04/02/2022 0938   ALKPHOS 64 09/08/2018 0838   BILITOT 0.9 04/02/2022 0938   GFRNONAA 82 09/20/2019 1147   GFRAA 96 09/20/2019 1147     Lab Results  Component Value Date   WBC 5.7 09/27/2021   HGB 15.8 09/27/2021   HCT 49.2 09/27/2021   MCV 83.0 09/27/2021   PLT 233 09/27/2021    Lab Results  Component Value Date   CHOL 96 04/02/2022   HDL 41 04/02/2022   LDLCALC 40 04/02/2022   TRIG 69 04/02/2022   CHOLHDL 2.3 04/02/2022    Lab Results  Component Value Date   HGBA1C 6.9 (H) 04/02/2022     No results found for: "TSH"   Lab Results  Component Value Date   PSA 1.54 09/27/2021   PSA 1.5 09/20/2019   PSA 1.2 09/18/2018      Assessment & Plan:   Type 2 diabetes mellitus: treated with Synjardy XR 12.05-998 mg two tablets daily. HGBA1c at 6.9 on 04/02/22. He has been walking frequently.  Hypertension: treated with Cozaar 100 mg daily, Norvasc 10 mg daily, Bystolic 10 mg daily, Catapres 0.1 mg twice daily. Blood pressure normal today at 130/68.  Hyperlipidemia: treated with Lipitor 10 mg with dinner. Lipid panel normal on 04/02/22.  Vaccine Counseling: Reports UTD on flu, Covid-19, shingles vaccines. Will go to the pharmacy for tetanus vaccine.  Hx of left thalamic CVA in 2014- no significant deficits at this time. Has had some paresthesias in right hand  Plan: Vaccines discussed and reviewed. We need to get dates of recent vaccines updated from pharmacy. Continue same meds, continue diet and exercise regimen. RTC in 6 months.Colonoscopy is up to date.   I,Alexander Ruley,acting as a Education administrator for Elby Showers, MD.,have documented all relevant documentation on the behalf of Elby Showers, MD,as directed by  Elby Showers, MD while in the presence of Elby Showers, MD.   I,  Elby Showers, MD, have reviewed all documentation for this visit. The documentation on 04/05/22 for the exam, diagnosis, procedures, and orders are all accurate and complete.

## 2022-04-02 ENCOUNTER — Other Ambulatory Visit: Payer: Medicare Other

## 2022-04-02 DIAGNOSIS — E1169 Type 2 diabetes mellitus with other specified complication: Secondary | ICD-10-CM | POA: Diagnosis not present

## 2022-04-02 DIAGNOSIS — E785 Hyperlipidemia, unspecified: Secondary | ICD-10-CM | POA: Diagnosis not present

## 2022-04-03 LAB — HEMOGLOBIN A1C
Hgb A1c MFr Bld: 6.9 % of total Hgb — ABNORMAL HIGH (ref <5.7)
Mean Plasma Glucose: 151 mg/dL
eAG (mmol/L): 8.4 mmol/L

## 2022-04-03 LAB — HEPATIC FUNCTION PANEL
AG Ratio: 1.7 (calc) (ref 1.0–2.5)
ALT: 17 U/L (ref 9–46)
AST: 14 U/L (ref 10–35)
Albumin: 4.1 g/dL (ref 3.6–5.1)
Alkaline phosphatase (APISO): 56 U/L (ref 35–144)
Bilirubin, Direct: 0.2 mg/dL (ref 0.0–0.2)
Globulin: 2.4 g/dL (calc) (ref 1.9–3.7)
Indirect Bilirubin: 0.7 mg/dL (calc) (ref 0.2–1.2)
Total Bilirubin: 0.9 mg/dL (ref 0.2–1.2)
Total Protein: 6.5 g/dL (ref 6.1–8.1)

## 2022-04-03 LAB — LIPID PANEL
Cholesterol: 96 mg/dL (ref <200)
HDL: 41 mg/dL (ref >=40)
LDL Cholesterol (Calc): 40 mg/dL (calc)
Non-HDL Cholesterol (Calc): 55 mg/dL (calc) (ref <130)
Total CHOL/HDL Ratio: 2.3 (calc) (ref <5.0)
Triglycerides: 69 mg/dL (ref <150)

## 2022-04-05 ENCOUNTER — Encounter: Payer: Self-pay | Admitting: Internal Medicine

## 2022-04-05 ENCOUNTER — Ambulatory Visit (INDEPENDENT_AMBULATORY_CARE_PROVIDER_SITE_OTHER): Payer: Medicare Other | Admitting: Internal Medicine

## 2022-04-05 VITALS — BP 130/68 | HR 67 | Temp 98.2°F | Ht 65.0 in | Wt 180.8 lb

## 2022-04-05 DIAGNOSIS — E785 Hyperlipidemia, unspecified: Secondary | ICD-10-CM

## 2022-04-05 DIAGNOSIS — E1169 Type 2 diabetes mellitus with other specified complication: Secondary | ICD-10-CM | POA: Diagnosis not present

## 2022-04-05 DIAGNOSIS — I10 Essential (primary) hypertension: Secondary | ICD-10-CM | POA: Diagnosis not present

## 2022-04-05 DIAGNOSIS — Z8673 Personal history of transient ischemic attack (TIA), and cerebral infarction without residual deficits: Secondary | ICD-10-CM | POA: Diagnosis not present

## 2022-04-05 DIAGNOSIS — Z6830 Body mass index (BMI) 30.0-30.9, adult: Secondary | ICD-10-CM

## 2022-04-05 NOTE — Patient Instructions (Signed)
It was a pleasure to see you today. Continue current meds and return in 6 months for health maintenance exam. Continue diet and exercise efforts.

## 2022-04-27 ENCOUNTER — Other Ambulatory Visit: Payer: Self-pay | Admitting: Endocrinology

## 2022-04-27 DIAGNOSIS — E1169 Type 2 diabetes mellitus with other specified complication: Secondary | ICD-10-CM

## 2022-05-23 ENCOUNTER — Other Ambulatory Visit: Payer: Self-pay | Admitting: Internal Medicine

## 2022-05-26 ENCOUNTER — Other Ambulatory Visit: Payer: Self-pay

## 2022-05-26 DIAGNOSIS — E1169 Type 2 diabetes mellitus with other specified complication: Secondary | ICD-10-CM

## 2022-05-26 MED ORDER — SYNJARDY XR 12.5-1000 MG PO TB24: 2.0000 | ORAL_TABLET | Freq: Every day | ORAL | 1 refills | Status: DC

## 2022-06-07 ENCOUNTER — Other Ambulatory Visit (INDEPENDENT_AMBULATORY_CARE_PROVIDER_SITE_OTHER): Payer: Medicare Other

## 2022-06-07 DIAGNOSIS — E1169 Type 2 diabetes mellitus with other specified complication: Secondary | ICD-10-CM

## 2022-06-07 DIAGNOSIS — E669 Obesity, unspecified: Secondary | ICD-10-CM

## 2022-06-07 LAB — BASIC METABOLIC PANEL
BUN: 11 mg/dL (ref 6–23)
CO2: 27 mEq/L (ref 19–32)
Calcium: 9.4 mg/dL (ref 8.4–10.5)
Chloride: 104 mEq/L (ref 96–112)
Creatinine, Ser: 0.98 mg/dL (ref 0.40–1.50)
GFR: 78.06 mL/min (ref >60.00)
Glucose, Bld: 109 mg/dL — ABNORMAL HIGH (ref 70–99)
Potassium: 3.7 mEq/L (ref 3.5–5.1)
Sodium: 142 mEq/L (ref 135–145)

## 2022-06-07 LAB — HEMOGLOBIN A1C: Hgb A1c MFr Bld: 7 % — ABNORMAL HIGH (ref 4.6–6.5)

## 2022-06-10 NOTE — Progress Notes (Unsigned)
Patient ID: Mike Parker, male   DOB: Jun 25, 1950, 72 y.o.   MRN: [id number]           Reason for Appointment: Follow-up for Type 2 Diabetes     History of Present Illness:          Date of diagnosis of type 2 diabetes mellitus: ?  2011        Background history:   He appears to have been on glipizide and Janumet for several years with fair control His A1c had been over 7% since 2015 He was referred here because of an A1c of 9.2% done in 6/17  Recent history:    Non-insulin hypoglycemic drugs: Synjardy 12.05/998, 2 tablets at lunch  Current management, blood sugar patterns and problems identified:  His A1c is 7, previously 7 also   He did bring his monitor for download He has done better with his diet since his last visit Blood sugars look fairly evenly controlled and he is monitoring consistently at various times Has only 1 unusually high reading of 257 otherwise blood sugars are about 160 at the maximum after meals As before his fasting readings are mildly increased  He was told to take his Synjardy together in the evening  No side effects with this and renal function is stable He does try to walk 4 to 5 days a week  Side effects from medications have been: Diarrhea from high-dose metformin  Compliance with the medical regimen: Fair  Glucose monitoring:  done 1 + times a day         Glucometer: One Touch Verio  Blood Glucose readings by download   PRE-MEAL Fasting Lunch Dinner Bedtime Overall  Glucose range: 92-141      Mean/median: 130    123   POST-MEAL PC Breakfast PC Lunch PC Dinner  Glucose range:   86-162  Mean/median:   123   Previously:   PRE-MEAL Fasting Lunch Dinner Bedtime Overall  Glucose range: 112-141      Mean/median:        POST-MEAL PC Breakfast PC Lunch PC Dinner  Glucose range:   188  Mean/median:        Self-care:  Meal times are:  Breakfast is skipped, lunch 12 noon, dinner usually 7-8 PM  Typical meal intake: Breakfast is  near noon egg or meat/ cereal.   Dinner is chicken or pork, greens, corn; snacks usually nuts, fruits            Dietician visit, most recent: 8/17                Weight history:  Wt Readings from Last 3 Encounters:  04/05/22 180 lb 12.8 oz (82 kg)  01/09/22 178 lb (80.7 kg)  10/02/21 177 lb 12.8 oz (80.6 kg)    Glycemic control:   Lab Results  Component Value Date   HGBA1C 7.0 (H) 06/07/2022   HGBA1C 6.9 (H) 04/02/2022   HGBA1C 7.0 (H) 01/07/2022   Lab Results  Component Value Date   MICROALBUR 1.7 01/07/2022   LDLCALC 40 04/02/2022   CREATININE 0.98 06/07/2022   Lab Results  Component Value Date   MICRALBCREAT 1.6 01/07/2022    Other problems discussed: See review of systems    Allergies as of 06/11/2022   No Known Allergies      Medication List        Accurate as of Jun 10, 2022  9:27 PM. If you have any questions, ask your nurse or  doctor.          amLODipine 10 MG tablet Commonly known as: NORVASC TAKE 1 TABLET DAILY   aspirin EC 81 MG tablet Take 1 tablet (81 mg total) by mouth daily.   atorvastatin 10 MG tablet Commonly known as: LIPITOR TAKE 1 TABLET AT SUPPER FORLIPID LOWERING   blood glucose meter kit and supplies Dispense based on patient and insurance preference. Test twice daily; dx code: E11.9   cloNIDine 0.1 MG tablet Commonly known as: CATAPRES Take 1 tablet (0.1 mg total) by mouth 2 (two) times daily.   econazole nitrate 1 % cream Apply topically daily.   fluticasone 50 MCG/ACT nasal spray Commonly known as: FLONASE USE TWO SPRAY(S) IN EACH NOSTRIL ONCE DAILY   freestyle lancets   losartan 100 MG tablet Commonly known as: COZAAR TAKE 1 TABLET DAILY   nebivolol 10 MG tablet Commonly known as: BYSTOLIC Take 1 tablet (10 mg total) by mouth daily.   OneTouch Verio Flex System w/Device Kit 1 each by Does not apply route daily. Use onetouch verio flex to check blood sugar once daily.   OneTouch Verio test  strip Generic drug: glucose blood USE TO CHECK BLOOD SUGAR   ONCE DAILY   Synjardy XR 12.05-998 MG Tb24 Generic drug: Empagliflozin-metFORMIN HCl ER Take 2 tablets by mouth daily.        Allergies: No Known Allergies  Past Medical History:  Diagnosis Date   Allergy    Diabetes mellitus    Hypertension    Insomnia    LVH (left ventricular hypertrophy)    Stroke (cerebrum) ([facility]) 2014    Past Surgical History:  Procedure Laterality Date   COLONOSCOPY     ELBOW SURGERY     rt elbow   HERNIA REPAIR     left inguinal   KNEE SURGERY     rt and left    Family History  Problem Relation Age of Onset   Stroke Mother    Hypertension Brother    Colon cancer Neg Hx    Diabetes Neg Hx     Social History:  reports that he has never smoked. He has never used smokeless tobacco. He reports that he does not drink alcohol and does not use drugs.   Review of Systems   Lipid history: On treatment long-term with Lipitor 10 mg daily from PCP    Lab Results  Component Value Date   CHOL 96 04/02/2022   HDL 41 04/02/2022   LDLCALC 40 04/02/2022   TRIG 69 04/02/2022   CHOLHDL 2.3 04/02/2022           Hypertension:Treated for >15 years and followed by PCP  He checks his blood pressure at home, recently about 112-118/70s  Has been prescribed Lasix also, no history of cardiomyopathy or edema Blood pressure was checked a second time standing  BP Readings from Last 3 Encounters:  04/05/22 130/68  01/09/22 118/64  10/02/21 134/80   Renal function consistently normal  Lab Results  Component Value Date   CREATININE 0.98 06/07/2022   CREATININE 0.89 01/07/2022   CREATININE 0.93 09/27/2021     Most recent eye exam was documented in 12/22  Most recent foot exam: 8/23  He has had his Covid vaccines  LABS:  Lab on 06/07/2022  Component Date Value Ref Range Status   Sodium 06/07/2022 142  135 - 145 mEq/L Final   Potassium 06/07/2022 3.7  3.5 - 5.1 mEq/L Final    Chloride 06/07/2022 104  96 - 112 mEq/L Final   CO2 06/07/2022 27  19 - 32 mEq/L Final   Glucose, Bld 06/07/2022 109 (H)  70 - 99 mg/dL Final   BUN 16/10/9602 11  6 - 23 mg/dL Final   Creatinine, Ser 06/07/2022 0.98  0.40 - 1.50 mg/dL Final   GFR 54/09/8117 78.06  >60.00 mL/min Final   Calculated using the CKD-EPI Creatinine Equation (2021)   Calcium 06/07/2022 9.4  8.4 - 10.5 mg/dL Final   Hgb J4N MFr Bld 06/07/2022 7.0 (H)  4.6 - 6.5 % Final   Glycemic Control Guidelines for People with Diabetes:Non Diabetic:  <6%Goal of Therapy: <7%Additional Action Suggested:  >8%     Physical Examination:  There were no vitals taken for this visit.      ASSESSMENT:  Diabetes type 2 on oral agents  See history of present illness for detailed discussion of current diabetes management, blood sugar patterns and problems identified  His A1c is 7 This is relatively higher than expected for his home sugars  Blood sugars are excellent at home with taking Synjardy 12.05/998, 2 tablets a day Recent average blood sugar at home 123 including postprandial reading Weight is stable  Renal function normal Urine microalbumin normal, explained interpretation of the result  HYPERTENSION: well controlled and no orthostatic symptoms even though systolic blood pressure is relatively low He can continue to monitor at home and follow-up with PCP as scheduled   PLAN:  Continue same regimen of Synjardy 2 tablets and can take this either in the evening or morning He will continue monitoring blood sugars at various times and let us know if they are consistently high Continue exercise and prudent diet    There are no Patient Instructions on file for this visit.      Reather Littler 06/10/2022, 9:27 PM   Note: This office note was prepared with Dragon voice recognition system technology. Any transcriptional errors that result from this process are unintentional.

## 2022-06-11 ENCOUNTER — Encounter: Payer: Self-pay | Admitting: Endocrinology

## 2022-06-11 ENCOUNTER — Ambulatory Visit (INDEPENDENT_AMBULATORY_CARE_PROVIDER_SITE_OTHER): Payer: Medicare Other | Admitting: Endocrinology

## 2022-06-11 VITALS — BP 120/68 | HR 65 | Ht 65.0 in | Wt 180.8 lb

## 2022-06-11 DIAGNOSIS — E1169 Type 2 diabetes mellitus with other specified complication: Secondary | ICD-10-CM | POA: Diagnosis not present

## 2022-06-11 DIAGNOSIS — E669 Obesity, unspecified: Secondary | ICD-10-CM | POA: Diagnosis not present

## 2022-06-11 DIAGNOSIS — Z7984 Long term (current) use of oral hypoglycemic drugs: Secondary | ICD-10-CM

## 2022-06-11 NOTE — Patient Instructions (Signed)
Check blood sugars on waking up 2-3 days a week  Also check blood sugars about 2 hours after meals and do this after different meals by rotation  Recommended blood sugar levels on waking up are 90-130 and about 2 hours after meal is 130-160  Please bring your blood sugar monitor to each visit, thank you   

## 2022-08-05 ENCOUNTER — Other Ambulatory Visit: Payer: Self-pay

## 2022-08-05 DIAGNOSIS — E119 Type 2 diabetes mellitus without complications: Secondary | ICD-10-CM

## 2022-08-05 DIAGNOSIS — E785 Hyperlipidemia, unspecified: Secondary | ICD-10-CM

## 2022-08-05 DIAGNOSIS — I10 Essential (primary) hypertension: Secondary | ICD-10-CM

## 2022-08-05 DIAGNOSIS — Z8673 Personal history of transient ischemic attack (TIA), and cerebral infarction without residual deficits: Secondary | ICD-10-CM

## 2022-08-05 MED ORDER — CLONIDINE HCL 0.1 MG PO TABS
0.1000 mg | ORAL_TABLET | Freq: Two times a day (BID) | ORAL | 0 refills | Status: DC
Start: 2022-08-05 — End: 2022-09-02

## 2022-08-12 ENCOUNTER — Other Ambulatory Visit: Payer: Self-pay

## 2022-08-12 MED ORDER — LOSARTAN POTASSIUM 100 MG PO TABS: 100.0000 mg | ORAL_TABLET | Freq: Every day | ORAL | 3 refills | Status: DC

## 2022-08-12 NOTE — Telephone Encounter (Signed)
LR 06/23/21, #90, 3 rf LOV 04/05/22 FOV 10/08/22  Rx sent to the pharmacy. Dm/cma

## 2022-09-02 ENCOUNTER — Other Ambulatory Visit: Payer: Self-pay

## 2022-09-02 DIAGNOSIS — E119 Type 2 diabetes mellitus without complications: Secondary | ICD-10-CM

## 2022-09-02 DIAGNOSIS — I10 Essential (primary) hypertension: Secondary | ICD-10-CM

## 2022-09-02 DIAGNOSIS — Z8673 Personal history of transient ischemic attack (TIA), and cerebral infarction without residual deficits: Secondary | ICD-10-CM

## 2022-09-02 DIAGNOSIS — E785 Hyperlipidemia, unspecified: Secondary | ICD-10-CM

## 2022-09-02 MED ORDER — CLONIDINE HCL 0.1 MG PO TABS
0.1000 mg | ORAL_TABLET | Freq: Two times a day (BID) | ORAL | 0 refills | Status: DC
Start: 2022-09-02 — End: 2022-11-14

## 2022-09-02 NOTE — Telephone Encounter (Signed)
Pt's medication was sent to pt's pharmacy as requested. Confirmation received.  °

## 2022-10-01 NOTE — Progress Notes (Signed)
Annual Wellness Visit    Patient Care Team: Margaree Mackintosh, MD as PCP - General (Internal Medicine)  Visit Date: 10/08/22   Chief Complaint  Patient presents with   Medicare Wellness   He also presents for health maintenance exam and evaluation of medical issues.  Subjective:   Patient: Mike Parker, Male    DOB: October 09, 1950, 72 y.o.   MRN: [id number]  Mike Parker is a 72 y.o. Male who presents today for his Annual Wellness Visit. History of Type 2 diabetes mellitus, hypertension, insomnia, left ventricular hypertrophy, stroke.  History of Type 2 diabetes mellitus treated with Synjardy XR 12.05-998 mg twice daily. HGBA1c at 6.8% on 10/04/22, down from 7.0% on 06/07/22.  History of hyperlipidemia treated with atorvastatin 10 mg with dinner. Lipid panel normal.   History of hypertension treated with amlodipine 10 mg daily, clonidine 0.1 mg twice daily, losartan 100 mg daily, nebivolol 10 mg daily. Blood pressure elevated today at 150/90. Followed by [facility]. 10/04/22 EKG showed sinus bradycardia.   He was evaluated for sleep apnea several years ago with Dr. Shelle Iron who thought he just needed to lose weight.    He had history of frozen shoulder previously treated by Dr. Teressa Senter and this resolved several years ago.   No known drug allergies.  He had a left thalamic CVA in June 2014.  2D echo at the time showed moderate LVH and diastolic dysfunction.  Renal artery duplex scan was negative for renal artery stenosis.  He recovered from the stroke but still has some mild paresthesias in his right hand.  Glucose is normal. Kidney, liver functions are normal. Electrolytes are normal. Blood proteins  are normal. RBC mildly elevated at 5.9 but was fasting overnight. HCT was normal. MCH slightly low at 26.3 and is always just under normal but he is not anemic. PSA  normal at 1.37.   Had colonoscopy done in 2017 which was completely normal.   Social history: He is married.  He retired  from the [employer] where he worked for many years.  2 adult daughters and has grandchildren.  He does not smoke or consume alcohol.  Mother-in-law resides in his home.  She has some dementia.   Family history: Mother died of a stroke.  2 daughters in good health.  Past Medical History:  Diagnosis Date   Allergy    Diabetes mellitus    Hypertension    Insomnia    LVH (left ventricular hypertrophy)    Stroke (cerebrum) (HCC) 2014     Family History  Problem Relation Age of Onset   Stroke Mother    Hypertension Brother    Colon cancer Neg Hx    Diabetes Neg Hx      Social hx: retired from [employer]. Married.2 adult daughters and also has grandchildren. Mother-in-law resides in his home. She has some mild dementia. Wife retired from [employer]. Nonsmoker. Does not consume alcohol. Walks and stays in shape.    Review of Systems  Constitutional:  Negative for chills, fever, malaise/fatigue and weight loss.  HENT:  Negative for hearing loss, sinus pain and sore throat.   Respiratory:  Negative for cough, hemoptysis and shortness of breath.   Cardiovascular:  Negative for chest pain, palpitations, leg swelling and PND.  Gastrointestinal:  Negative for abdominal pain, constipation, diarrhea, heartburn, nausea and vomiting.  Genitourinary:  Negative for dysuria, frequency and urgency.  Musculoskeletal:  Negative for back pain, myalgias and neck pain.  Skin:  Negative for itching and rash.  Neurological:  Negative for dizziness, tingling, seizures and headaches.  Endo/Heme/Allergies:  Negative for polydipsia.  Psychiatric/Behavioral:  Negative for depression. The patient is not nervous/anxious.       Objective:   Vitals: BP (!) 150/90   Pulse 64   Ht 5\' 5"  (1.651 m)   Wt 182 lb (82.6 kg)   SpO2 97%   BMI 30.29 kg/m   Physical Exam Vitals and nursing note reviewed.  Constitutional:      General: He is awake. He is not in acute distress.    Appearance: Normal  appearance. He is not ill-appearing or toxic-appearing.  HENT:     Head: Normocephalic and atraumatic.     Right Ear: Hearing, tympanic membrane, ear canal and external ear normal.     Left Ear: Hearing, tympanic membrane, ear canal and external ear normal.     Mouth/Throat:     Pharynx: Oropharynx is clear.  Eyes:     Extraocular Movements: Extraocular movements intact.     Pupils: Pupils are equal, round, and reactive to light.  Neck:     Thyroid: No thyroid mass, thyromegaly or thyroid tenderness.     Vascular: No carotid bruit.  Cardiovascular:     Rate and Rhythm: Normal rate and regular rhythm. No extrasystoles are present.    Pulses:          Dorsalis pedis pulses are 2+ on the right side and 2+ on the left side.       Posterior tibial pulses are 2+ on the right side and 2+ on the left side.     Heart sounds: Normal heart sounds. No murmur heard.    No friction rub. No gallop.  Pulmonary:     Effort: Pulmonary effort is normal.     Breath sounds: Normal breath sounds. No decreased breath sounds, wheezing, rhonchi or rales.  Chest:     Chest wall: No mass.  Abdominal:     Palpations: Abdomen is soft.     Tenderness: There is no abdominal tenderness.     Hernia: No hernia is present.  Genitourinary:    Prostate: Normal. Not enlarged and no nodules present.     Comments: Prostate smooth and symmetrical. Musculoskeletal:     Cervical back: Normal range of motion.     Right lower leg: No edema.     Left lower leg: No edema.  Lymphadenopathy:     Cervical: No cervical adenopathy.     Upper Body:     Right upper body: No supraclavicular adenopathy.     Left upper body: No supraclavicular adenopathy.  Skin:    General: Skin is warm and dry.  Neurological:     General: No focal deficit present.     Mental Status: He is alert and oriented to person, place, and time. Mental status is at baseline.     Cranial Nerves: Cranial nerves 2-12 are intact.     Sensory: Sensation is  intact.     Motor: Motor function is intact.     Coordination: Coordination is intact.     Gait: Gait is intact.     Deep Tendon Reflexes: Reflexes are normal and symmetric.  Psychiatric:        Attention and Perception: Attention normal.        Mood and Affect: Mood normal.        Speech: Speech normal.        Behavior: Behavior normal. Behavior  is cooperative.        Thought Content: Thought content normal.        Cognition and Memory: Cognition and memory normal.        Judgment: Judgment normal.      Most recent functional status assessment:    10/08/2022   11:00 AM  In your present state of health, do you have any difficulty performing the following activities:  Hearing? 0  Vision? 0  Difficulty concentrating or making decisions? 0  Walking or climbing stairs? 0  Dressing or bathing? 0  Doing errands, shopping? 0  Preparing Food and eating ? N  Using the Toilet? N  In the past six months, have you accidently leaked urine? N  Do you have problems with loss of bowel control? N  Managing your Medications? N  Managing your Finances? N  Housekeeping or managing your Housekeeping? N   Most recent fall risk assessment:    10/08/2022   11:04 AM  Fall Risk   Falls in the past year? 0  Number falls in past yr: 0  Injury with Fall? 0  Risk for fall due to : No Fall Risks    Most recent depression screenings:    10/08/2022   11:03 AM 04/05/2022    9:39 AM  PHQ 2/9 Scores  PHQ - 2 Score 0 0   Most recent cognitive screening:    10/08/2022   11:05 AM  6CIT Screen  What Year? 0 points  What month? 0 points  What time? 0 points  Count back from 20 0 points  Months in reverse 0 points  Repeat phrase 0 points  Total Score 0 points     Results:   Studies obtained and personally reviewed by me:  Had colonoscopy done in 2017 which was completely normal.   Labs:       Component Value Date/Time   NA 141 10/04/2022 0932   K 4.9 10/04/2022 0932   CL 102  10/04/2022 0932   CO2 28 10/04/2022 0932   GLUCOSE 97 10/04/2022 0932   BUN 16 10/04/2022 0932   CREATININE 1.05 10/04/2022 0932   CALCIUM 9.8 10/04/2022 0932   PROT 6.8 10/04/2022 0932   ALBUMIN 4.5 09/08/2018 0838   AST 15 10/04/2022 0932   ALT 17 10/04/2022 0932   ALKPHOS 64 09/08/2018 0838   BILITOT 0.9 10/04/2022 0932   GFRNONAA 82 09/20/2019 1147   GFRAA 96 09/20/2019 1147     Lab Results  Component Value Date   WBC 4.9 10/04/2022   HGB 15.5 10/04/2022   HCT 48.4 10/04/2022   MCV 82.0 10/04/2022   PLT 239 10/04/2022    Lab Results  Component Value Date   CHOL 82 10/04/2022   HDL 40 10/04/2022   LDLCALC 28 10/04/2022   TRIG 68 10/04/2022   CHOLHDL 2.1 10/04/2022    Lab Results  Component Value Date   HGBA1C 6.8 (H) 10/04/2022     No results found for: "TSH"   Lab Results  Component Value Date   PSA 1.37 10/04/2022   PSA 1.54 09/27/2021   PSA 1.5 09/20/2019    Assessment & Plan:   Type 2 diabetes mellitus: treated with Synjardy XR 12.05-998 mg twice daily. HGBA1c stable at 6.8% on 10/04/22, down from 7.0% on 06/07/22.  Hyperlipidemia: treated with atorvastatin 10 mg with dinner. Lipid panel normal.   Hypertension: treated with amlodipine 10 mg daily, clonidine 0.1 mg twice daily, losartan 100 mg daily, nebivolol 10  mg daily. Blood pressure elevated today at 150/90. Followed by [facility]. 10/04/22 EKG showed sinus bradycardia. Patient will continue to monitor BP at home.   Stool was guaiac negative.  Had colonoscopy done in 2017 which was completely normal.  Vaccine counseling: administered flu vaccine. He will go to [facility] pharmacy for tetanus vaccine and  then in September for the Covid-19 booster.  Return in 6 months for follow-up or as needed.     Annual wellness visit done today including the all of the following: Reviewed patient's Family Medical History Reviewed and updated list of patient's medical providers Assessment of cognitive  impairment was done Assessed patient's functional ability Established a written schedule for health screening services Health Risk Assessent Completed and Reviewed  Discussed health benefits of physical activity, and encouraged him to engage in regular exercise appropriate for his age and condition.        I,Alexander Ruley,acting as a Neurosurgeon for Margaree Mackintosh, MD.,have documented all relevant documentation on the behalf of Margaree Mackintosh, MD,as directed by  Margaree Mackintosh, MD while in the presence of Margaree Mackintosh, MD.   I, Margaree Mackintosh, MD, have reviewed all documentation for this visit. The documentation on 10/19/22 for the exam, diagnosis, procedures, and orders are all accurate and complete.

## 2022-10-03 NOTE — Progress Notes (Signed)
Cardiology Office Note    Patient Name: Mike Parker Date of Encounter: 10/03/2022  Primary Care Provider:  Margaree Mackintosh, MD Primary Cardiologist:  Tobias Alexander, MD Primary Electrophysiologist: None   Past Medical History    Past Medical History:  Diagnosis Date   Allergy    Diabetes mellitus    Hypertension    Insomnia    LVH (left ventricular hypertrophy)    Stroke (cerebrum) [facility]) 2014    History of Present Illness  Mike Parker is a 72 y.o. male with a PMH of HTN, DM type II, Thalamic CVA 06/2012 who presents today for 1 year follow-up.  Mike Parker was seen initially in 2015 by Dr. Delton See and will need to establish care with new cardiologist.  He was last seen by Dr. Shari Prows on 05/2020 and was doing well with no new complaints.  He was walking 5 days a week and blood pressures were under better control.  He was seen by Herma Carson, PA on 07/25/2021 no medication changes were made at that time.  Patient's last 2D echo was completed 06/2012 EF of 60 to 65% with no RWMA mildly dilated LA, normal systolic function.  Current BP regimen consist of Cozaar 100 mg daily, Norvasc 10 mg daily, Bystolic 10 mg daily, Catapres 0.1 mg twice daily.  He is currently followed by endocrinology for management of DM type II.  During today's visit the patient reports that he has been doing well with no new cardiac complaints since previous visit.  His blood pressure today is controlled at 122/78 and heart rate is 55 bpm.  He reports compliance with his current medications and denies any adverse reactions.  He is staying active and does yard work exercises.  He also follows a low-sodium heart healthy diet.  During today's visit we discussed the importance of primary and secondary prevention for cardiovascular disease.  He had all questions answered to his satisfaction.  Patient denies chest pain, palpitations, dyspnea, PND, orthopnea, nausea, vomiting, dizziness, syncope, edema, weight gain, or early  satiety.   Review of Systems  Please see the history of present illness.    All other systems reviewed and are otherwise negative except as noted above.  Physical Exam    Wt Readings from Last 3 Encounters:  06/11/22 180 lb 12.8 oz (82 kg)  04/05/22 180 lb 12.8 oz (82 kg)  01/09/22 178 lb (80.7 kg)   MV:HQION were no vitals filed for this visit.,There is no height or weight on file to calculate BMI. GEN: Well nourished, well developed in no acute distress Neck: No JVD; No carotid bruits Pulmonary: Clear to auscultation without rales, wheezing or rhonchi  Cardiovascular: Normal rate. Regular rhythm. Normal S1. Normal S2.   Murmurs: There is no murmur.  ABDOMEN: Soft, non-tender, non-distended EXTREMITIES:  No edema; No deformity   EKG/LABS/ Recent Cardiac Studies   ECG personally reviewed by me today -sinus bradycardia with rate of 55 bpm with no acute changes consistent with previous EKG.    Lab Results  Component Value Date   WBC 5.7 09/27/2021   HGB 15.8 09/27/2021   HCT 49.2 09/27/2021   MCV 83.0 09/27/2021   PLT 233 09/27/2021   Lab Results  Component Value Date   CREATININE 0.98 06/07/2022   BUN 11 06/07/2022   NA 142 06/07/2022   K 3.7 06/07/2022   CL 104 06/07/2022   CO2 27 06/07/2022   Lab Results  Component Value Date   CHOL 96  04/02/2022   HDL 41 04/02/2022   LDLCALC 40 04/02/2022   TRIG 69 04/02/2022   CHOLHDL 2.3 04/02/2022    Lab Results  Component Value Date   HGBA1C 7.0 (H) 06/07/2022   Assessment & Plan    1.  Essential hypertension: -Patient's blood pressure today was controlled at 122/78 -Continue losartan 100 mg daily, Bystolic 10 mg daily, clonidine 0.1 mg twice daily, Norvasc 10 mg daily -Continue DASH diet  2.  Hyperlipidemia: -Patient's last LDL cholesterol was 40 -Continue Lipitor 10 mg daily -Patient encouraged to maintain 150 minutes of exercise weekly  3.  DM type II: -Patient's last hemoglobin A1c was 6.9 -Continue  medications and treatment plan per PCP/endocrinology  4.  History of CVA: -History of left thalamic CVA in 2014 with no residual deficit -Continue current GDMT with Lipitor 10 mg daily and ASA 81 mg daily     Disposition: Follow-up with Tobias Alexander, MD or APP in residual months    Signed, Napoleon Form, Leodis Rains, NP 10/03/2022, 1:57 PM [facility]

## 2022-10-04 ENCOUNTER — Ambulatory Visit: Payer: Medicare Other | Attending: Nurse Practitioner | Admitting: Nurse Practitioner

## 2022-10-04 ENCOUNTER — Other Ambulatory Visit: Payer: Medicare Other

## 2022-10-04 VITALS — BP 122/78 | HR 55 | Ht 65.0 in | Wt 182.6 lb

## 2022-10-04 DIAGNOSIS — Z8673 Personal history of transient ischemic attack (TIA), and cerebral infarction without residual deficits: Secondary | ICD-10-CM

## 2022-10-04 DIAGNOSIS — Z125 Encounter for screening for malignant neoplasm of prostate: Secondary | ICD-10-CM | POA: Diagnosis not present

## 2022-10-04 DIAGNOSIS — E119 Type 2 diabetes mellitus without complications: Secondary | ICD-10-CM | POA: Insufficient documentation

## 2022-10-04 DIAGNOSIS — E785 Hyperlipidemia, unspecified: Secondary | ICD-10-CM | POA: Diagnosis not present

## 2022-10-04 DIAGNOSIS — E8881 Metabolic syndrome: Secondary | ICD-10-CM

## 2022-10-04 DIAGNOSIS — Z79899 Other long term (current) drug therapy: Secondary | ICD-10-CM | POA: Diagnosis not present

## 2022-10-04 DIAGNOSIS — Z7984 Long term (current) use of oral hypoglycemic drugs: Secondary | ICD-10-CM

## 2022-10-04 DIAGNOSIS — I10 Essential (primary) hypertension: Secondary | ICD-10-CM

## 2022-10-04 DIAGNOSIS — E1169 Type 2 diabetes mellitus with other specified complication: Secondary | ICD-10-CM

## 2022-10-04 DIAGNOSIS — Z0000 Encounter for general adult medical examination without abnormal findings: Secondary | ICD-10-CM | POA: Diagnosis not present

## 2022-10-04 NOTE — Patient Instructions (Addendum)
Medication Instructions:  Your physician recommends that you continue on your current medications as directed. Please refer to the Current Medication list given to you today. *If you need a refill on your cardiac medications before your next appointment, please call your pharmacy*   Lab Work: None ordered If you have labs (blood work) drawn today and your tests are completely normal, you will receive your results only by: MyChart Message (if you have MyChart) OR A paper copy in the mail If you have any lab test that is abnormal or we need to change your treatment, we will call you to review the results.   Testing/Procedures: None ordered   Follow-Up: At [facility], you and your health needs are our priority.  As part of our continuing mission to provide you with exceptional heart care, we have created designated Provider Care Teams.  These Care Teams include your primary Cardiologist (physician) and Advanced Practice Providers (APPs -  Physician Assistants and Nurse Practitioners) who all work together to provide you with the care you need, when you need it.  We recommend signing up for the patient portal called "MyChart".  Sign up information is provided on this After Visit Summary.  MyChart is used to connect with patients for Virtual Visits (Telemedicine).  Patients are able to view lab/test results, encounter notes, upcoming appointments, etc.  Non-urgent messages can be sent to your provider as well.   To learn more about what you can do with MyChart, go to [email or web address].    Your next appointment:   12 month(s)  Provider:   Robin Searing, NP       Dr Jacinto Halim  Dr Odis Hollingshead Dr Rosemary Holms Dr Jacques Navy Other Instructions

## 2022-10-05 LAB — COMPLETE METABOLIC PANEL WITH GFR
AG Ratio: 1.8 (calc) (ref 1.0–2.5)
ALT: 17 U/L (ref 9–46)
AST: 15 U/L (ref 10–35)
Albumin: 4.4 g/dL (ref 3.6–5.1)
Alkaline phosphatase (APISO): 64 U/L (ref 35–144)
BUN: 16 mg/dL (ref 7–25)
CO2: 28 mmol/L (ref 20–32)
Calcium: 9.8 mg/dL (ref 8.6–10.3)
Chloride: 102 mmol/L (ref 98–110)
Creat: 1.05 mg/dL (ref 0.70–1.28)
Globulin: 2.4 g/dL (ref 1.9–3.7)
Glucose, Bld: 97 mg/dL (ref 65–99)
Potassium: 4.9 mmol/L (ref 3.5–5.3)
Sodium: 141 mmol/L (ref 135–146)
Total Bilirubin: 0.9 mg/dL (ref 0.2–1.2)
Total Protein: 6.8 g/dL (ref 6.1–8.1)
eGFR: 76 mL/min/{1.73_m2} (ref >=60)

## 2022-10-05 LAB — LIPID PANEL
Cholesterol: 82 mg/dL (ref <200)
HDL: 40 mg/dL (ref >=40)
LDL Cholesterol (Calc): 28 mg/dL
Non-HDL Cholesterol (Calc): 42 mg/dL (ref <130)
Total CHOL/HDL Ratio: 2.1 (calc) (ref <5.0)
Triglycerides: 68 mg/dL (ref <150)

## 2022-10-05 LAB — CBC WITH DIFFERENTIAL/PLATELET
Absolute Monocytes: 470 {cells}/uL (ref 200–950)
Basophils Absolute: 29 {cells}/uL (ref 0–200)
Basophils Relative: 0.6 %
Eosinophils Absolute: 299 {cells}/uL (ref 15–500)
Eosinophils Relative: 6.1 %
HCT: 48.4 % (ref 38.5–50.0)
Hemoglobin: 15.5 g/dL (ref 13.2–17.1)
Lymphs Abs: 1156 {cells}/uL (ref 850–3900)
MCH: 26.3 pg — ABNORMAL LOW (ref 27.0–33.0)
MCHC: 32 g/dL (ref 32.0–36.0)
MCV: 82 fL (ref 80.0–100.0)
MPV: 11.1 fL (ref 7.5–12.5)
Monocytes Relative: 9.6 %
Neutro Abs: 2945 {cells}/uL (ref 1500–7800)
Neutrophils Relative %: 60.1 %
Platelets: 239 10*3/uL (ref 140–400)
RBC: 5.9 10*6/uL — ABNORMAL HIGH (ref 4.20–5.80)
RDW: 13.5 % (ref 11.0–15.0)
Total Lymphocyte: 23.6 %
WBC: 4.9 10*3/uL (ref 3.8–10.8)

## 2022-10-05 LAB — PSA: PSA: 1.37 ng/mL (ref <=4.00)

## 2022-10-05 LAB — MICROALBUMIN / CREATININE URINE RATIO
Creatinine, Urine: 117 mg/dL (ref 20–320)
Microalb Creat Ratio: 12 mg/g{creat} (ref <30)
Microalb, Ur: 1.4 mg/dL

## 2022-10-05 LAB — HEMOGLOBIN A1C
Hgb A1c MFr Bld: 6.8 %{Hb} — ABNORMAL HIGH (ref <5.7)
Mean Plasma Glucose: 148 mg/dL
eAG (mmol/L): 8.2 mmol/L

## 2022-10-08 ENCOUNTER — Encounter: Payer: Self-pay | Admitting: Internal Medicine

## 2022-10-08 ENCOUNTER — Ambulatory Visit (INDEPENDENT_AMBULATORY_CARE_PROVIDER_SITE_OTHER): Payer: Medicare Other | Admitting: Internal Medicine

## 2022-10-08 ENCOUNTER — Other Ambulatory Visit (HOSPITAL_COMMUNITY): Payer: Self-pay

## 2022-10-08 VITALS — BP 150/90 | HR 64 | Ht 65.0 in | Wt 182.0 lb

## 2022-10-08 DIAGNOSIS — E785 Hyperlipidemia, unspecified: Secondary | ICD-10-CM | POA: Diagnosis not present

## 2022-10-08 DIAGNOSIS — Z0000 Encounter for general adult medical examination without abnormal findings: Secondary | ICD-10-CM | POA: Diagnosis not present

## 2022-10-08 DIAGNOSIS — Z1211 Encounter for screening for malignant neoplasm of colon: Secondary | ICD-10-CM | POA: Diagnosis not present

## 2022-10-08 DIAGNOSIS — E1169 Type 2 diabetes mellitus with other specified complication: Secondary | ICD-10-CM | POA: Diagnosis not present

## 2022-10-08 DIAGNOSIS — Z23 Encounter for immunization: Secondary | ICD-10-CM

## 2022-10-08 DIAGNOSIS — I10 Essential (primary) hypertension: Secondary | ICD-10-CM | POA: Diagnosis not present

## 2022-10-08 DIAGNOSIS — Z8673 Personal history of transient ischemic attack (TIA), and cerebral infarction without residual deficits: Secondary | ICD-10-CM

## 2022-10-08 LAB — POCT URINALYSIS DIP (CLINITEK)
Bilirubin, UA: NEGATIVE
Blood, UA: NEGATIVE
Glucose, UA: 250 mg/dL — AB
Ketones, POC UA: NEGATIVE mg/dL
Leukocytes, UA: NEGATIVE
Nitrite, UA: NEGATIVE
POC PROTEIN,UA: NEGATIVE
Spec Grav, UA: 1.015 (ref 1.010–1.025)
Urobilinogen, UA: 0.2 U/dL
pH, UA: 6.5 (ref 5.0–8.0)

## 2022-10-08 LAB — HEMOCCULT GUIAC POC 1CARD (OFFICE): Fecal Occult Blood, POC: NEGATIVE

## 2022-10-08 MED ORDER — TETANUS-DIPHTH-ACELL PERTUSSIS 5-2.5-18.5 LF-MCG/0.5 IM SUSY
0.5000 mL | PREFILLED_SYRINGE | Freq: Once | INTRAMUSCULAR | 0 refills | Status: AC
  Filled 2022-10-08: qty 0.5, 1d supply, fill #0

## 2022-10-19 NOTE — Patient Instructions (Signed)
It was a pleasure to see you today. Continue same meds. Labs are stable. Continue diet and exercise efforts. Return in 6 months or as needed. Vaccines discussed.

## 2022-11-13 ENCOUNTER — Other Ambulatory Visit (HOSPITAL_COMMUNITY): Payer: Self-pay

## 2022-11-14 ENCOUNTER — Other Ambulatory Visit: Payer: Self-pay

## 2022-11-14 DIAGNOSIS — Z8673 Personal history of transient ischemic attack (TIA), and cerebral infarction without residual deficits: Secondary | ICD-10-CM

## 2022-11-14 DIAGNOSIS — E119 Type 2 diabetes mellitus without complications: Secondary | ICD-10-CM

## 2022-11-14 DIAGNOSIS — E785 Hyperlipidemia, unspecified: Secondary | ICD-10-CM

## 2022-11-14 DIAGNOSIS — I10 Essential (primary) hypertension: Secondary | ICD-10-CM

## 2022-11-14 MED ORDER — CLONIDINE HCL 0.1 MG PO TABS: 0.1000 mg | ORAL_TABLET | Freq: Two times a day (BID) | ORAL | 3 refills | Status: AC

## 2022-11-25 ENCOUNTER — Other Ambulatory Visit: Payer: Self-pay

## 2022-11-25 DIAGNOSIS — E1165 Type 2 diabetes mellitus with hyperglycemia: Secondary | ICD-10-CM

## 2022-11-25 DIAGNOSIS — E1169 Type 2 diabetes mellitus with other specified complication: Secondary | ICD-10-CM

## 2022-11-30 DIAGNOSIS — Z23 Encounter for immunization: Secondary | ICD-10-CM | POA: Diagnosis not present

## 2022-12-02 ENCOUNTER — Other Ambulatory Visit (INDEPENDENT_AMBULATORY_CARE_PROVIDER_SITE_OTHER): Payer: Medicare Other

## 2022-12-02 DIAGNOSIS — E1169 Type 2 diabetes mellitus with other specified complication: Secondary | ICD-10-CM

## 2022-12-02 DIAGNOSIS — E669 Obesity, unspecified: Secondary | ICD-10-CM | POA: Diagnosis not present

## 2022-12-02 DIAGNOSIS — E1165 Type 2 diabetes mellitus with hyperglycemia: Secondary | ICD-10-CM | POA: Diagnosis not present

## 2022-12-02 LAB — BASIC METABOLIC PANEL
BUN: 12 mg/dL (ref 6–23)
CO2: 28 meq/L (ref 19–32)
Calcium: 9.3 mg/dL (ref 8.4–10.5)
Chloride: 102 meq/L (ref 96–112)
Creatinine, Ser: 1 mg/dL (ref 0.40–1.50)
GFR: 75.93 mL/min (ref >60.00)
Glucose, Bld: 116 mg/dL — ABNORMAL HIGH (ref 70–99)
Potassium: 3.6 meq/L (ref 3.5–5.1)
Sodium: 140 meq/L (ref 135–145)

## 2022-12-02 LAB — HEMOGLOBIN A1C: Hgb A1c MFr Bld: 6.7 % — ABNORMAL HIGH (ref 4.6–6.5)

## 2022-12-03 ENCOUNTER — Other Ambulatory Visit: Payer: Self-pay

## 2022-12-03 DIAGNOSIS — E1169 Type 2 diabetes mellitus with other specified complication: Secondary | ICD-10-CM

## 2022-12-03 MED ORDER — SYNJARDY XR 12.5-1000 MG PO TB24: 2.0000 | ORAL_TABLET | Freq: Every day | ORAL | 1 refills | Status: DC

## 2022-12-05 ENCOUNTER — Ambulatory Visit: Payer: Medicare Other | Admitting: Endocrinology

## 2022-12-05 ENCOUNTER — Encounter: Payer: Self-pay | Admitting: Endocrinology

## 2022-12-05 DIAGNOSIS — E119 Type 2 diabetes mellitus without complications: Secondary | ICD-10-CM

## 2022-12-05 DIAGNOSIS — Z7984 Long term (current) use of oral hypoglycemic drugs: Secondary | ICD-10-CM | POA: Diagnosis not present

## 2022-12-05 MED ORDER — SYNJARDY XR 12.5-1000 MG PO TB24
2.0000 | ORAL_TABLET | Freq: Every day | ORAL | 4 refills | Status: DC
Start: 2022-12-05 — End: 2023-06-05

## 2022-12-05 NOTE — Progress Notes (Signed)
Outpatient Endocrinology Note [place] Justa Hatchell, MD  12/05/22  Patient's Name: Mike Parker    DOB: 1950-07-07    MRN: [id number]                                                    REASON OF VISIT: Follow up of type 2 diabetes mellitus  PCP: Margaree Mackintosh, MD  HISTORY OF PRESENT ILLNESS:   DAVIE CLAUD is a 72 y.o. old male with past medical history listed below, is here for follow up for type 2 diabetes mellitus.   Pertinent Diabetes History: Patient was diagnosed with type 2 diabetes mellitus around 2011.  Patient has controlled type 2 diabetes mellitus.  Chronic Diabetes Complications : Retinopathy: no. Last ophthalmology exam was done on annually, 12/2021, following with ophthalmology regularly.  Nephropathy: no, on ACE/ARB /losartan. Peripheral neuropathy: no Coronary artery disease: no Stroke: yes  Relevant comorbidities and cardiovascular risk factors: Obesity: no Body mass index is 29.89 kg/m.  Hypertension: Yes  Hyperlipidemia : Yes, on statin   Current / Home Diabetic regimen includes:  Synjardy 12.05/998 mg 2 tablets with breakfast.  Prior diabetic medications: Glipizide and Janumet in the past.  Glycemic data:   One Touch Verio Flex glucometer.  Glucose download from October 24 to November 7 reviewed.  Average blood sugar 124, highest blood sugar 167 and lowest 97.  Some of the other blood sugar 129, 132, 97.  He has been checking blood sugars few times a week at different times of the day.  Hypoglycemia: Patient has no hypoglycemic episodes. Patient has hypoglycemia awareness.  Factors modifying glucose control: 1.  Diabetic diet assessment: 3 meals a day.  2.  Staying active or exercising:   3.  Medication compliance: compliant all of the time.  Interval history  Glucometer data as reviewed above.  Hemoglobin A1c recently 6.7%.  Lab work from September including lipid panel, urine microalbumin creatinine ratio and electrolytes and renal function  reviewed and are all acceptable range.  He has no complaints of numbness and ting of the feet.  No vision problem.  No other complaints today.  REVIEW OF SYSTEMS As per history of present illness.   PAST MEDICAL HISTORY: Past Medical History:  Diagnosis Date   Allergy    Diabetes mellitus    Hypertension    Insomnia    LVH (left ventricular hypertrophy)    Stroke (cerebrum) (HCC) 2014    PAST SURGICAL HISTORY: Past Surgical History:  Procedure Laterality Date   COLONOSCOPY     ELBOW SURGERY     rt elbow   HERNIA REPAIR     left inguinal   KNEE SURGERY     rt and left    ALLERGIES: No Known Allergies  FAMILY HISTORY:  Family History  Problem Relation Age of Onset   Stroke Mother    Hypertension Brother    Colon cancer Neg Hx    Diabetes Neg Hx     SOCIAL HISTORY: Social History   Socioeconomic History   Marital status: Married    Spouse name: Not on file   Number of children: 2   Years of education: BS   Highest education level: Bachelor's degree (e.g., BA, AB, BS)  Occupational History   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[employer]  Tobacco Use   Smoking status: Never   Smokeless  tobacco: Never  Vaping Use   Vaping status: Never Used  Substance and Sexual Activity   Alcohol use: No   Drug use: No   Sexual activity: Yes  Other Topics Concern   Not on file  Social History Narrative   Not on file   Social Determinants of Health   Financial Resource Strain: Low Risk  (10/04/2022)   Overall Financial Resource Strain (CARDIA)    Difficulty of Paying Living Expenses: Not hard at all  Food Insecurity: No Food Insecurity (10/04/2022)   Hunger Vital Sign    Worried About Running Out of Food in the Last Year: Never true    Ran Out of Food in the Last Year: Never true  Transportation Needs: No Transportation Needs (10/04/2022)   PRAPARE - Administrator, Civil Service (Medical): No    Lack of Transportation (Non-Medical): No  Physical Activity:  Insufficiently Active (10/04/2022)   Exercise Vital Sign    Days of Exercise per Week: 3 days    Minutes of Exercise per Session: 30 min  Stress: No Stress Concern Present (10/04/2022)   [employer] of Occupational Health - Occupational Stress Questionnaire    Feeling of Stress : Not at all  Social Connections: Moderately Integrated (10/04/2022)   Social Connection and Isolation Panel [NHANES]    Frequency of Communication with Friends and Family: More than three times a week    Frequency of Social Gatherings with Friends and Family: More than three times a week    Attends Religious Services: Never    Database administrator or Organizations: Yes    Attends Banker Meetings: Never    Marital Status: Married    MEDICATIONS:  Current Outpatient Medications  Medication Sig Dispense Refill   amLODipine (NORVASC) 10 MG tablet TAKE 1 TABLET DAILY 90 tablet 3   aspirin EC 81 MG EC tablet Take 1 tablet (81 mg total) by mouth daily. 30 tablet 3   atorvastatin (LIPITOR) 10 MG tablet TAKE 1 TABLET AT SUPPER FORLIPID LOWERING 90 tablet 3   blood glucose meter kit and supplies Dispense based on patient and insurance preference. Test twice daily; dx code: E11.9 1 each 0   Blood Glucose Monitoring Suppl (ONETOUCH VERIO FLEX SYSTEM) w/Device KIT 1 each by Does not apply route daily. Use onetouch verio flex to check blood sugar once daily. 1 kit 0   cloNIDine (CATAPRES) 0.1 MG tablet Take 1 tablet (0.1 mg total) by mouth 2 (two) times daily. 180 tablet 3   econazole nitrate 1 % cream Apply topically daily. 15 g 5   fluticasone (FLONASE) 50 MCG/ACT nasal spray USE TWO SPRAY(S) IN EACH NOSTRIL ONCE DAILY 16 g 11   Lancets (FREESTYLE) lancets      losartan (COZAAR) 100 MG tablet Take 1 tablet (100 mg total) by mouth daily. 90 tablet 3   nebivolol (BYSTOLIC) 10 MG tablet Take 1 tablet (10 mg total) by mouth daily. 90 tablet 3   ONETOUCH VERIO test strip USE TO CHECK BLOOD SUGAR   ONCE DAILY 100  strip 2   Empagliflozin-metFORMIN HCl ER (SYNJARDY XR) 12.05-998 MG TB24 Take 2 tablets by mouth daily. 180 tablet 4   No current facility-administered medications for this visit.    PHYSICAL EXAM: Vitals:   12/05/22 0901  BP: 122/60  Pulse: 64  Resp: 20  SpO2: 97%  Weight: 179 lb 9.6 oz (81.5 kg)  Height: 5\' 5"  (1.651 m)   Body mass index is  29.89 kg/m.  Wt Readings from Last 3 Encounters:  12/05/22 179 lb 9.6 oz (81.5 kg)  10/08/22 182 lb (82.6 kg)  10/04/22 182 lb 9.6 oz (82.8 kg)    General: Well developed, well nourished male in no apparent distress.  HEENT: AT/[place], no external lesions.  Eyes: Conjunctiva clear and no icterus. Neck: Neck supple  Lungs: Respirations not labored Neurologic: Alert, oriented, normal speech Extremities / Skin: Dry.  Psychiatric: Does not appear depressed or anxious  Diabetic Foot Exam - Simple   No data filed    LABS Reviewed Lab Results  Component Value Date   HGBA1C 6.7 (H) 12/02/2022   HGBA1C 6.8 (H) 10/04/2022   HGBA1C 7.0 (H) 06/07/2022   Lab Results  Component Value Date   FRUCTOSAMINE 249 06/01/2018   FRUCTOSAMINE 259 09/19/2015   Lab Results  Component Value Date   CHOL 82 10/04/2022   HDL 40 10/04/2022   LDLCALC 28 10/04/2022   TRIG 68 10/04/2022   CHOLHDL 2.1 10/04/2022   Lab Results  Component Value Date   MICRALBCREAT 12 10/04/2022   MICRALBCREAT 1.6 01/07/2022   Lab Results  Component Value Date   CREATININE 1.00 12/02/2022   Lab Results  Component Value Date   GFR 75.93 12/02/2022    ASSESSMENT / PLAN  1. Controlled type 2 diabetes mellitus without complication, without long-term current use of insulin (HCC)     Diabetes Mellitus type 2, complicated by no known complication. - Diabetic status / severity: Controlled.  Lab Results  Component Value Date   HGBA1C 6.7 (H) 12/02/2022    - Hemoglobin A1c goal : <7%  - Medications: No change.  I) Synjardy 12.05/998 mg 2 tablets daily.  -  Home glucose testing: Few times a week at different times of the day. - Discussed/ Gave Hypoglycemia treatment plan.  # Consult : not required at this time.   # Annual urine for microalbuminuria/ creatinine ratio, no microalbuminuria currently, continue ACE/ARB /losartan. Last  Lab Results  Component Value Date   MICRALBCREAT 12 10/04/2022    # Foot check nightly.  # Annual dilated diabetic eye exams.   - Diet: Make healthy diabetic food choices - Life style / activity / exercise: Discussed.  2. Blood pressure  -  BP Readings from Last 1 Encounters:  12/05/22 122/60    - Control is in target.  - No change in current plans.  3. Lipid status / Hyperlipidemia - Last  Lab Results  Component Value Date   LDLCALC 28 10/04/2022   - Continue atorvastatin 10 mg daily.  Diagnoses and all orders for this visit:  Controlled type 2 diabetes mellitus without complication, without long-term current use of insulin (HCC) -     Empagliflozin-metFORMIN HCl ER (SYNJARDY XR) 12.05-998 MG TB24; Take 2 tablets by mouth daily. -     Basic metabolic panel; Future -     Hemoglobin A1c; Future    DISPOSITION Follow up in clinic in 6  months suggested.   All questions answered and patient verbalized understanding of the plan.  [place] Aurelio Mccamy, MD [facility] [address] [place], [address] [place], [place] [address] Phone # [phone]  At least part of this note was generated using voice recognition software. Inadvertent word errors may have occurred, which were not recognized during the proofreading process.

## 2022-12-11 ENCOUNTER — Other Ambulatory Visit: Payer: Self-pay

## 2023-02-03 DIAGNOSIS — H25813 Combined forms of age-related cataract, bilateral: Secondary | ICD-10-CM | POA: Diagnosis not present

## 2023-02-03 DIAGNOSIS — H18513 Endothelial corneal dystrophy, bilateral: Secondary | ICD-10-CM | POA: Diagnosis not present

## 2023-02-03 DIAGNOSIS — H43812 Vitreous degeneration, left eye: Secondary | ICD-10-CM | POA: Diagnosis not present

## 2023-02-03 DIAGNOSIS — E119 Type 2 diabetes mellitus without complications: Secondary | ICD-10-CM | POA: Diagnosis not present

## 2023-02-03 DIAGNOSIS — H35413 Lattice degeneration of retina, bilateral: Secondary | ICD-10-CM | POA: Diagnosis not present

## 2023-02-03 LAB — HM DIABETES EYE EXAM

## 2023-02-04 ENCOUNTER — Encounter: Payer: Self-pay | Admitting: Internal Medicine

## 2023-03-20 ENCOUNTER — Other Ambulatory Visit: Payer: Self-pay | Admitting: Internal Medicine

## 2023-04-04 NOTE — Progress Notes (Signed)
 Patient Care Team: Margaree Mackintosh, MD as PCP - General (Internal Medicine)  Visit Date: 04/10/23  Subjective:   Chief Complaint  Patient presents with   6 month follow up  Patient ID:Mike Parker,Male DOB:[birth year],72 y.o. VHQ:[id number]   73 y.o. Male presents today for 6 months follow-up for HTN, BMI, HLD, Hx of CVA. Patient has a past medical history of Allergic Rhinitis, Type 2 Diabetes Mellitus, Essential HTN, Hyperlipidemia, and Metabolic Syndrome. Last seen 10/08/2022 for his annual visit, in the interim has seen Dr. [place] Thapa with Endocrinology in 11/2022 and Sallye Lat with Ophthalmology in 01/2023.   History of Hypertension treated with Cozaar 100 mg daily, Norvasc 10 mg daily, Bystolic 10 mg daily, Catapres 0.1 mg twice daily. Blood Pressure: normotensive today at 134/86.   History of Hyperlipidemia treated with 10 mg Atorvastatin daily. 04/07/2023 Lipid Panel WNL.   History of Diabetes Mellitus, type 2 treated with 12.05-998 mg Synjardy-XR BID. 04/07/2023 HgbA1c 7.1, elevated from 6.9 on 04/02/22. In the past 3 months he says he's been to a couple of funerals, where he contracted the flu, so hasn't been exercising much or eating too well. UTD on Diabetic Eye Exam since 02/03/2023 w/o any diabetic retinopathy noted.    Patient is active and has kept his weight under control. BMI is 31.05, slightly up from 30.29 in 09/2022. Enjoys walking for exercise and now that it's getting warmer and he's feeling better will start walking more.   History of Left Thalamic CVA in 2014, in the past has had some paresthesia in his right hand, but has otherwise recovered well without any lasting deficits.   Colonoscopy 2017, normal w/ repeat 2027.  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  Review of Systems  Constitutional:  Negative for fever and malaise/fatigue.  HENT:  Negative for congestion.   Eyes:  Negative for blurred vision.  Respiratory:  Negative for cough and shortness of breath.   Cardiovascular:  Negative for chest pain, palpitations and leg swelling.  Gastrointestinal:  Negative for vomiting.  Musculoskeletal:  Negative for back pain.  Skin:  Negative for rash.  Neurological:  Negative for loss of consciousness and headaches.     Objective:  Vitals: BP 134/86   Pulse 68   Temp 98.2 F (36.8 C)   Wt 186 lb 9.6 oz (84.6 kg)   SpO2 98%   BMI 31.05 kg/m   Physical Exam Constitutional:      General: He is not in acute distress.    Appearance: Normal appearance. He is not ill-appearing.  HENT:     Head: Normocephalic and atraumatic.  Neck:     Thyroid: No thyromegaly.  Cardiovascular:     Rate and Rhythm: Normal rate and regular rhythm.     Pulses: Normal pulses.     Heart sounds: Normal heart sounds. No murmur heard.    No friction rub. No gallop.  Pulmonary:     Effort: Pulmonary  effort is normal. No respiratory distress.     Breath sounds: Normal breath sounds. No wheezing or rales.  Skin:    General: Skin is warm and dry.  Neurological:     Mental Status: He is alert and oriented to person, place, and time. Mental status is at baseline.  Psychiatric:        Mood and Affect: Mood normal.        Behavior: Behavior normal.        Thought Content: Thought content normal.        Judgment: Judgment normal.    Results:  Studies Obtained And Personally Reviewed By Me:  2017 Colonoscopy normal.   Labs:     Component Value Date/Time   NA 140 12/02/2022 0817   K 3.6 12/02/2022 0817   CL 102 12/02/2022 0817   CO2 28  12/02/2022 0817   GLUCOSE 116 (H) 12/02/2022 0817   BUN 12 12/02/2022 0817   CREATININE 1.00 12/02/2022 0817   CREATININE 1.05 10/04/2022 0932   CALCIUM 9.3 12/02/2022 0817   PROT 6.4 04/07/2023 0926   ALBUMIN 4.5 09/08/2018 0838   AST 17 04/07/2023 0926   ALT 21 04/07/2023 0926   ALKPHOS 64 09/08/2018 0838   BILITOT 0.8 04/07/2023 0926   GFRNONAA 82 09/20/2019 1147   GFRAA 96 09/20/2019 1147    Lab Results  Component Value Date   WBC 4.9 10/04/2022   HGB 15.5 10/04/2022   HCT 48.4 10/04/2022   MCV 82.0 10/04/2022   PLT 239 10/04/2022   Lab Results  Component Value Date   CHOL 120 04/07/2023   HDL 44 04/07/2023   LDLCALC 59 04/07/2023   TRIG 90 04/07/2023   CHOLHDL 2.7 04/07/2023   Lab Results  Component Value Date   HGBA1C 7.1 (H) 04/07/2023     Lab Results  Component Value Date   PSA 1.37 10/04/2022   PSA 1.54 09/27/2021   PSA 1.5 09/20/2019     Assessment & Plan:  Hyperlipidemia treated with 10 mg Atorvastatin daily. 04/07/2023 Lipid Panel WNL.   Diabetes Mellitus, type 2 treated with 12.05-998 mg Synjardy-XR BID. 04/07/2023 HgbA1c 7.1, elevated from 6.9 on 04/02/22. UTD on Diabetic Eye Exam since 02/03/2023 w/o any diabetic retinopathy noted.    Hypertension treated with Cozaar 100 mg daily, Norvasc 10 mg daily, Bystolic 10 mg daily, Catapres 0.1 mg twice daily. Blood Pressure: normotensive today at 134/86.   BMI is 31.05, slightly up from 30.29 in 09/2022. Enjoys walking for exercise and now that it's getting warmer and he's feeling better will start walking more.   History of Left Thalamic CVA in 2014, in the past has had some paresthesia in his right hand, but has otherwise recovered well without any lasting deficits.   Colonoscopy 2017, normal w/ repeat 2027.    I,Emily Lagle,acting as a Neurosurgeon for Margaree Mackintosh, MD.,have documented all relevant documentation on the behalf of Margaree Mackintosh, MD,as directed by  Margaree Mackintosh, MD while in the presence of Margaree Mackintosh, MD.   ***

## 2023-04-07 ENCOUNTER — Other Ambulatory Visit: Payer: Medicare Other

## 2023-04-07 DIAGNOSIS — I10 Essential (primary) hypertension: Secondary | ICD-10-CM | POA: Diagnosis not present

## 2023-04-07 DIAGNOSIS — E785 Hyperlipidemia, unspecified: Secondary | ICD-10-CM | POA: Diagnosis not present

## 2023-04-07 DIAGNOSIS — E1169 Type 2 diabetes mellitus with other specified complication: Secondary | ICD-10-CM

## 2023-04-08 LAB — HEPATIC FUNCTION PANEL
AG Ratio: 2 (calc) (ref 1.0–2.5)
ALT: 21 U/L (ref 9–46)
AST: 17 U/L (ref 10–35)
Albumin: 4.3 g/dL (ref 3.6–5.1)
Alkaline phosphatase (APISO): 60 U/L (ref 35–144)
Bilirubin, Direct: 0.2 mg/dL (ref 0.0–0.2)
Globulin: 2.1 g/dL (ref 1.9–3.7)
Indirect Bilirubin: 0.6 mg/dL (ref 0.2–1.2)
Total Bilirubin: 0.8 mg/dL (ref 0.2–1.2)
Total Protein: 6.4 g/dL (ref 6.1–8.1)

## 2023-04-08 LAB — LIPID PANEL
Cholesterol: 120 mg/dL (ref <200)
HDL: 44 mg/dL (ref >=40)
LDL Cholesterol (Calc): 59 mg/dL
Non-HDL Cholesterol (Calc): 76 mg/dL (ref <130)
Total CHOL/HDL Ratio: 2.7 (calc) (ref <5.0)
Triglycerides: 90 mg/dL (ref <150)

## 2023-04-08 LAB — HEMOGLOBIN A1C
Hgb A1c MFr Bld: 7.1 %{Hb} — ABNORMAL HIGH (ref <5.7)
Mean Plasma Glucose: 157 mg/dL
eAG (mmol/L): 8.7 mmol/L

## 2023-04-10 ENCOUNTER — Encounter: Payer: Self-pay | Admitting: Internal Medicine

## 2023-04-10 ENCOUNTER — Ambulatory Visit (INDEPENDENT_AMBULATORY_CARE_PROVIDER_SITE_OTHER): Payer: Medicare Other | Admitting: Internal Medicine

## 2023-04-10 VITALS — BP 134/86 | HR 68 | Temp 98.2°F | Wt 186.6 lb

## 2023-04-10 DIAGNOSIS — E1169 Type 2 diabetes mellitus with other specified complication: Secondary | ICD-10-CM

## 2023-04-10 DIAGNOSIS — I10 Essential (primary) hypertension: Secondary | ICD-10-CM | POA: Diagnosis not present

## 2023-04-10 DIAGNOSIS — E785 Hyperlipidemia, unspecified: Secondary | ICD-10-CM

## 2023-04-10 DIAGNOSIS — Z6831 Body mass index (BMI) 31.0-31.9, adult: Secondary | ICD-10-CM

## 2023-04-10 DIAGNOSIS — Z8673 Personal history of transient ischemic attack (TIA), and cerebral infarction without residual deficits: Secondary | ICD-10-CM

## 2023-04-10 NOTE — Patient Instructions (Signed)
 It was a pleasure to see you today.  Your labs are stable.  Please work on diet exercise and weight loss.  Would like to see hemoglobin A1c under 7% cholesterol is well-controlled on current medication.  Medicare wellness and health maintenance exam will be scheduled for 6 months from now.

## 2023-04-11 ENCOUNTER — Encounter: Payer: Self-pay | Admitting: Internal Medicine

## 2023-05-06 ENCOUNTER — Other Ambulatory Visit: Payer: Self-pay

## 2023-05-06 DIAGNOSIS — E119 Type 2 diabetes mellitus without complications: Secondary | ICD-10-CM

## 2023-05-21 ENCOUNTER — Other Ambulatory Visit: Payer: Self-pay

## 2023-05-21 ENCOUNTER — Other Ambulatory Visit: Payer: Self-pay | Admitting: Internal Medicine

## 2023-05-21 DIAGNOSIS — I10 Essential (primary) hypertension: Secondary | ICD-10-CM

## 2023-05-21 MED ORDER — LOSARTAN POTASSIUM 100 MG PO TABS: 100.0000 mg | ORAL_TABLET | Freq: Every day | ORAL | 3 refills | Status: DC

## 2023-05-21 MED ORDER — NEBIVOLOL HCL 10 MG PO TABS: 10.0000 mg | ORAL_TABLET | Freq: Every day | ORAL | 3 refills | Status: DC

## 2023-05-30 ENCOUNTER — Other Ambulatory Visit: Payer: Medicare Other

## 2023-05-30 DIAGNOSIS — E119 Type 2 diabetes mellitus without complications: Secondary | ICD-10-CM | POA: Diagnosis not present

## 2023-05-30 LAB — BASIC METABOLIC PANEL WITH GFR
BUN: 12 mg/dL (ref 7–25)
CO2: 28 mmol/L (ref 20–32)
Calcium: 9.7 mg/dL (ref 8.6–10.3)
Chloride: 102 mmol/L (ref 98–110)
Creat: 1.04 mg/dL (ref 0.70–1.28)
Glucose, Bld: 109 mg/dL — ABNORMAL HIGH (ref 65–99)
Potassium: 4.2 mmol/L (ref 3.5–5.3)
Sodium: 139 mmol/L (ref 135–146)
eGFR: 77 mL/min/{1.73_m2} (ref >=60)

## 2023-05-30 LAB — HEMOGLOBIN A1C
Hgb A1c MFr Bld: 7 % — ABNORMAL HIGH (ref <5.7)
Mean Plasma Glucose: 154 mg/dL
eAG (mmol/L): 8.5 mmol/L

## 2023-06-02 ENCOUNTER — Encounter: Payer: Self-pay | Admitting: Endocrinology

## 2023-06-04 ENCOUNTER — Ambulatory Visit (INDEPENDENT_AMBULATORY_CARE_PROVIDER_SITE_OTHER): Payer: Medicare Other | Admitting: Endocrinology

## 2023-06-04 ENCOUNTER — Encounter: Payer: Self-pay | Admitting: Endocrinology

## 2023-06-04 VITALS — BP 110/62 | HR 66 | Resp 20 | Ht 65.0 in | Wt 185.2 lb

## 2023-06-04 DIAGNOSIS — E1165 Type 2 diabetes mellitus with hyperglycemia: Secondary | ICD-10-CM | POA: Diagnosis not present

## 2023-06-04 DIAGNOSIS — Z7985 Long-term (current) use of injectable non-insulin antidiabetic drugs: Secondary | ICD-10-CM | POA: Diagnosis not present

## 2023-06-04 NOTE — Progress Notes (Signed)
 Outpatient Endocrinology Note Mike Shubh Chiara, MD  06/04/23  Patient's Name: Mike Parker    DOB: 10/05/1950    MRN: [id number]                                                    REASON OF VISIT: Follow up of type 2 diabetes mellitus  PCP: Sylvan Evener, MD  HISTORY OF PRESENT ILLNESS:   Mike Parker is a 73 y.o. old male with past medical history listed below, is here for follow up for type 2 diabetes mellitus.   Pertinent Diabetes History: Patient was diagnosed with type 2 diabetes mellitus around 2011.  Patient has controlled type 2 diabetes mellitus.  Chronic Diabetes Complications : Retinopathy: no. Last ophthalmology exam was done on annually, 01/2023, following with ophthalmology regularly.  Nephropathy: no, on ACE/ARB /losartan . Peripheral neuropathy: no Coronary artery disease: no Stroke: yes  Relevant comorbidities and cardiovascular risk factors: Obesity: no Body mass index is 30.82 kg/m.  Hypertension: Yes  Hyperlipidemia : Yes, on statin   Current / Home Diabetic regimen includes:  Synjardy  12.05/998 mg 2 tablets with breakfast.  Prior diabetic medications: Glipizide  and Janumet  in the past.  Glycemic data:    One Touch Verio Flex glucometer.  Not able to download glucometer in the clinic today.  Blood sugars reviewed directly from the meter some of the blood sugar are 170, 136, 121, 183, 196, 130, 113, 100, 133, 131, 140, 134, 130, 120.  Mostly acceptable blood sugar with mild hyperglycemia up to 196 in the evening/at bedtime.  Hypoglycemia: Patient has no hypoglycemic episodes. Patient has hypoglycemia awareness.  Factors modifying glucose control: 1.  Diabetic diet assessment: 3 meals a day.  2.  Staying active or exercising:   3.  Medication compliance: compliant all of the time.  Interval history  Glucometer data as reviewed above.  Hemoglobin A1c 7%.  Recent renal function electrolytes normal.  He denies numbness and ting of the feet.  No vision  problem.  No other complaints today.  REVIEW OF SYSTEMS As per history of present illness.   PAST MEDICAL HISTORY: Past Medical History:  Diagnosis Date   Allergy    Diabetes mellitus    Hypertension    Insomnia    LVH (left ventricular hypertrophy)    Stroke (cerebrum) (HCC) 2014    PAST SURGICAL HISTORY: Past Surgical History:  Procedure Laterality Date   COLONOSCOPY     ELBOW SURGERY     rt elbow   HERNIA REPAIR     left inguinal   KNEE SURGERY     rt and left    ALLERGIES: No Known Allergies  FAMILY HISTORY:  Family History  Problem Relation Age of Onset   Stroke Mother    Hypertension Brother    Colon cancer Neg Hx    Diabetes Neg Hx     SOCIAL HISTORY: Social History   Socioeconomic History   Marital status: Married    Spouse name: Not on file   Number of children: 2   Years of education: BS   Highest education level: Bachelor's degree (e.g., BA, AB, BS)  Occupational History   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[employer]  Tobacco Use   Smoking status: Never   Smokeless tobacco: Never  Vaping Use   Vaping status: Never Used  Substance and Sexual Activity  Alcohol use: No   Drug use: No   Sexual activity: Yes  Other Topics Concern   Not on file  Social History Narrative   Not on file   Social Drivers of Health   Financial Resource Strain: Low Risk  (10/04/2022)   Overall Financial Resource Strain (CARDIA)    Difficulty of Paying Living Expenses: Not hard at all  Food Insecurity: No Food Insecurity (10/04/2022)   Hunger Vital Sign    Worried About Running Out of Food in the Last Year: Never true    Ran Out of Food in the Last Year: Never true  Transportation Needs: No Transportation Needs (10/04/2022)   PRAPARE - Administrator, Civil Service (Medical): No    Lack of Transportation (Non-Medical): No  Physical Activity: Insufficiently Active (10/04/2022)   Exercise Vital Sign    Days of Exercise per Week: 3 days    Minutes of Exercise per  Session: 30 min  Stress: No Stress Concern Present (10/04/2022)   [employer] of Occupational Health - Occupational Stress Questionnaire    Feeling of Stress : Not at all  Social Connections: Moderately Integrated (10/04/2022)   Social Connection and Isolation Panel [NHANES]    Frequency of Communication with Friends and Family: More than three times a week    Frequency of Social Gatherings with Friends and Family: More than three times a week    Attends Religious Services: Never    Database administrator or Organizations: Yes    Attends Banker Meetings: Never    Marital Status: Married    MEDICATIONS:  Current Outpatient Medications  Medication Sig Dispense Refill   amLODipine  (NORVASC ) 10 MG tablet TAKE 1 TABLET DAILY 90 tablet 3   aspirin  EC 81 MG EC tablet Take 1 tablet (81 mg total) by mouth daily. 30 tablet 3   atorvastatin  (LIPITOR) 10 MG tablet TAKE 1 TABLET AT SUPPER FORLIPID LOWERING 90 tablet 3   blood glucose meter kit and supplies Dispense based on patient and insurance preference. Test twice daily; dx code: E11.9 1 each 0   Blood Glucose Monitoring Suppl (ONETOUCH VERIO FLEX SYSTEM) w/Device KIT 1 each by Does not apply route daily. Use onetouch verio flex to check blood sugar once daily. 1 kit 0   cloNIDine  (CATAPRES ) 0.1 MG tablet Take 1 tablet (0.1 mg total) by mouth 2 (two) times daily. 180 tablet 3   econazole nitrate  1 % cream Apply topically daily. 15 g 5   Empagliflozin -metFORMIN  HCl ER (SYNJARDY  XR) 12.05-998 MG TB24 Take 2 tablets by mouth daily. 180 tablet 4   fluticasone  (FLONASE ) 50 MCG/ACT nasal spray USE TWO SPRAY(S) IN EACH NOSTRIL ONCE DAILY 16 g 11   Lancets (FREESTYLE) lancets      losartan  (COZAAR ) 100 MG tablet TAKE 1 TABLET DAILY 90 tablet 3   losartan  (COZAAR ) 100 MG tablet Take 1 tablet (100 mg total) by mouth daily. 90 tablet 3   nebivolol  (BYSTOLIC ) 10 MG tablet TAKE 1 TABLET DAILY 90 tablet 3   nebivolol  (BYSTOLIC ) 10 MG tablet  Take 1 tablet (10 mg total) by mouth daily. 90 tablet 3   ONETOUCH VERIO test strip USE TO CHECK BLOOD SUGAR   ONCE DAILY 100 strip 2   No current facility-administered medications for this visit.    PHYSICAL EXAM: Vitals:   06/04/23 0825  BP: 110/62  Pulse: 66  Resp: 20  SpO2: 97%  Weight: 185 lb 3.2 oz (84 kg)  Height:  5\' 5"  (1.651 m)    Body mass index is 30.82 kg/m.  Wt Readings from Last 3 Encounters:  06/04/23 185 lb 3.2 oz (84 kg)  04/10/23 186 lb 9.6 oz (84.6 kg)  12/05/22 179 lb 9.6 oz (81.5 kg)    General: Well developed, well nourished male in no apparent distress.  HEENT: AT/[place], no external lesions.  Eyes: Conjunctiva clear and no icterus. Neck: Neck supple  Lungs: Respirations not labored Neurologic: Alert, oriented, normal speech Extremities / Skin: Dry.  Psychiatric: Does not appear depressed or anxious  Diabetic Foot Exam - Simple   No data filed    LABS Reviewed Lab Results  Component Value Date   HGBA1C 7.0 (H) 05/30/2023   HGBA1C 7.1 (H) 04/07/2023   HGBA1C 6.7 (H) 12/02/2022   Lab Results  Component Value Date   FRUCTOSAMINE 249 06/01/2018   FRUCTOSAMINE 259 09/19/2015   Lab Results  Component Value Date   CHOL 120 04/07/2023   HDL 44 04/07/2023   LDLCALC 59 04/07/2023   TRIG 90 04/07/2023   CHOLHDL 2.7 04/07/2023   Lab Results  Component Value Date   MICRALBCREAT 12 10/04/2022   MICRALBCREAT 1.6 01/07/2022   Lab Results  Component Value Date   CREATININE 1.04 05/30/2023   Lab Results  Component Value Date   GFR 75.93 12/02/2022    ASSESSMENT / PLAN  1. Uncontrolled type 2 diabetes mellitus with hyperglycemia, without long-term current use of insulin  (HCC)     Diabetes Mellitus type 2, complicated by no known complication. - Diabetic status / severity: fair controlled.  Lab Results  Component Value Date   HGBA1C 7.0 (H) 05/30/2023    - Hemoglobin A1c goal : <6.5%  Discussed about portion control and limiting  carbohydrates, advised to avoid bedtime snack.  - Medications: No change.  I) Synjardy  12.05/998 mg 2 tablets daily.  - Home glucose testing: Few times a week at different times of the day. - Discussed/ Gave Hypoglycemia treatment plan.  # Consult : not required at this time.   # Annual urine for microalbuminuria/ creatinine ratio, no microalbuminuria currently, continue ACE/ARB /losartan . Last  Lab Results  Component Value Date   MICRALBCREAT 12 10/04/2022    # Foot check nightly.  # Annual dilated diabetic eye exams.   - Diet: Make healthy diabetic food choices - Life style / activity / exercise: Discussed.  2. Blood pressure  -  BP Readings from Last 1 Encounters:  06/04/23 110/62    - Control is in target.  - No change in current plans.  3. Lipid status / Hyperlipidemia - Last  Lab Results  Component Value Date   LDLCALC 59 04/07/2023   - Continue atorvastatin  10 mg daily.  Managed by primary care provider.  Diagnoses and all orders for this visit:  Uncontrolled type 2 diabetes mellitus with hyperglycemia, without long-term current use of insulin  (HCC) -     Microalbumin / creatinine urine ratio -     Basic Metabolic Panel Without GFR -     Hemoglobin A1c     DISPOSITION Follow up in clinic in 6  months suggested.  Labs prior to follow-up visit as ordered.   All questions answered and patient verbalized understanding of the plan.  Mike Thaer Miyoshi, MD [facility] [address] [place], [address] [place], [place] [address] Phone # [phone]  At least part of this note was generated using voice recognition software. Inadvertent word errors may have occurred,  which were not recognized during the proofreading process.

## 2023-06-05 ENCOUNTER — Other Ambulatory Visit: Payer: Self-pay

## 2023-06-05 DIAGNOSIS — E119 Type 2 diabetes mellitus without complications: Secondary | ICD-10-CM

## 2023-06-05 MED ORDER — SYNJARDY XR 12.5-1000 MG PO TB24: 2.0000 | ORAL_TABLET | Freq: Every day | ORAL | 4 refills | Status: AC

## 2023-09-10 DIAGNOSIS — Z23 Encounter for immunization: Secondary | ICD-10-CM | POA: Diagnosis not present

## 2023-10-07 ENCOUNTER — Other Ambulatory Visit

## 2023-10-07 DIAGNOSIS — Z6831 Body mass index (BMI) 31.0-31.9, adult: Secondary | ICD-10-CM

## 2023-10-07 DIAGNOSIS — I10 Essential (primary) hypertension: Secondary | ICD-10-CM

## 2023-10-07 DIAGNOSIS — E1169 Type 2 diabetes mellitus with other specified complication: Secondary | ICD-10-CM

## 2023-10-07 DIAGNOSIS — Z0000 Encounter for general adult medical examination without abnormal findings: Secondary | ICD-10-CM | POA: Diagnosis not present

## 2023-10-07 DIAGNOSIS — Z125 Encounter for screening for malignant neoplasm of prostate: Secondary | ICD-10-CM | POA: Diagnosis not present

## 2023-10-07 DIAGNOSIS — E119 Type 2 diabetes mellitus without complications: Secondary | ICD-10-CM

## 2023-10-07 DIAGNOSIS — E785 Hyperlipidemia, unspecified: Secondary | ICD-10-CM | POA: Diagnosis not present

## 2023-10-08 ENCOUNTER — Ambulatory Visit: Payer: Self-pay | Admitting: Internal Medicine

## 2023-10-08 LAB — COMPREHENSIVE METABOLIC PANEL WITH GFR
AG Ratio: 1.9 (calc) (ref 1.0–2.5)
ALT: 20 U/L (ref 9–46)
AST: 16 U/L (ref 10–35)
Albumin: 4.1 g/dL (ref 3.6–5.1)
Alkaline phosphatase (APISO): 55 U/L (ref 35–144)
BUN: 10 mg/dL (ref 7–25)
CO2: 29 mmol/L (ref 20–32)
Calcium: 9.5 mg/dL (ref 8.6–10.3)
Chloride: 103 mmol/L (ref 98–110)
Creat: 0.94 mg/dL (ref 0.70–1.28)
Globulin: 2.2 g/dL (ref 1.9–3.7)
Glucose, Bld: 96 mg/dL (ref 65–99)
Potassium: 4.6 mmol/L (ref 3.5–5.3)
Sodium: 140 mmol/L (ref 135–146)
Total Bilirubin: 1 mg/dL (ref 0.2–1.2)
Total Protein: 6.3 g/dL (ref 6.1–8.1)
eGFR: 87 mL/min/1.73m2 (ref >=60)

## 2023-10-08 LAB — HEMOGLOBIN A1C
Hgb A1c MFr Bld: 6.3 % — ABNORMAL HIGH (ref <5.7)
Mean Plasma Glucose: 134 mg/dL
eAG (mmol/L): 7.4 mmol/L

## 2023-10-08 LAB — CBC WITH DIFFERENTIAL/PLATELET
Absolute Lymphocytes: 1191 {cells}/uL (ref 850–3900)
Absolute Monocytes: 495 {cells}/uL (ref 200–950)
Basophils Absolute: 29 {cells}/uL (ref 0–200)
Basophils Relative: 0.6 %
Eosinophils Absolute: 250 {cells}/uL (ref 15–500)
Eosinophils Relative: 5.1 %
HCT: 48 % (ref 38.5–50.0)
Hemoglobin: 15.3 g/dL (ref 13.2–17.1)
MCH: 27.1 pg (ref 27.0–33.0)
MCHC: 31.9 g/dL — ABNORMAL LOW (ref 32.0–36.0)
MCV: 85.1 fL (ref 80.0–100.0)
MPV: 10.7 fL (ref 7.5–12.5)
Monocytes Relative: 10.1 %
Neutro Abs: 2935 {cells}/uL (ref 1500–7800)
Neutrophils Relative %: 59.9 %
Platelets: 250 Thousand/uL (ref 140–400)
RBC: 5.64 Million/uL (ref 4.20–5.80)
RDW: 13.8 % (ref 11.0–15.0)
Total Lymphocyte: 24.3 %
WBC: 4.9 Thousand/uL (ref 3.8–10.8)

## 2023-10-08 LAB — MICROALBUMIN / CREATININE URINE RATIO
Creatinine, Urine: 143 mg/dL (ref 20–320)
Microalb Creat Ratio: 8 mg/g{creat} (ref <30)
Microalb, Ur: 1.2 mg/dL

## 2023-10-08 LAB — LIPID PANEL
Cholesterol: 118 mg/dL (ref <200)
HDL: 46 mg/dL (ref >=40)
LDL Cholesterol (Calc): 57 mg/dL
Non-HDL Cholesterol (Calc): 72 mg/dL (ref <130)
Total CHOL/HDL Ratio: 2.6 (calc) (ref <5.0)
Triglycerides: 72 mg/dL (ref <150)

## 2023-10-08 LAB — PSA: PSA: 1.46 ng/mL (ref <=4.00)

## 2023-10-10 ENCOUNTER — Encounter: Payer: Self-pay | Admitting: Physician Assistant

## 2023-10-13 ENCOUNTER — Encounter: Payer: Self-pay | Admitting: Internal Medicine

## 2023-10-13 ENCOUNTER — Ambulatory Visit: Admitting: Internal Medicine

## 2023-10-13 VITALS — BP 130/80 | HR 63 | Ht 65.0 in | Wt 182.0 lb

## 2023-10-13 DIAGNOSIS — Z8673 Personal history of transient ischemic attack (TIA), and cerebral infarction without residual deficits: Secondary | ICD-10-CM

## 2023-10-13 DIAGNOSIS — E785 Hyperlipidemia, unspecified: Secondary | ICD-10-CM | POA: Diagnosis not present

## 2023-10-13 DIAGNOSIS — Z0000 Encounter for general adult medical examination without abnormal findings: Secondary | ICD-10-CM | POA: Diagnosis not present

## 2023-10-13 DIAGNOSIS — Z6830 Body mass index (BMI) 30.0-30.9, adult: Secondary | ICD-10-CM

## 2023-10-13 DIAGNOSIS — E1169 Type 2 diabetes mellitus with other specified complication: Secondary | ICD-10-CM

## 2023-10-13 DIAGNOSIS — Z7984 Long term (current) use of oral hypoglycemic drugs: Secondary | ICD-10-CM

## 2023-10-13 DIAGNOSIS — Z23 Encounter for immunization: Secondary | ICD-10-CM | POA: Diagnosis not present

## 2023-10-13 DIAGNOSIS — I10 Essential (primary) hypertension: Secondary | ICD-10-CM

## 2023-10-13 NOTE — Progress Notes (Shared)
 Annual Wellness Visit   Patient Care Team: Perri Ronal PARAS, MD as PCP - General (Internal Medicine)  Visit Date: 10/13/23   Chief Complaint  Patient presents with   Medicare Wellness   Follow-up   Subjective:  Patient: Mike Parker, Male DOB: May 15, 1950, 73 y.o. MRN: [id number] Vitals:   10/13/23 1052  BP: 130/80   Mike Parker is a 73 y.o. Male who presents today for his Annual Wellness Visit. Patient has Hypertension; Diabetes mellitus (HCC); Obesity; Numbness and tingling; Cerebral infarction (HCC); Essential hypertension; OSA (obstructive sleep apnea); Metabolic syndrome; Left knee pain; Paresthesias in right hand; and Acute conjunctivitis of left eye on their problem list.   Notes that he's taking care of his grandchild as his daughter is away at [facility]    History of Hypertension treated with Cozaar  100 mg daily, Norvasc  10 mg daily, Bystolic  10 mg daily, Catapres  0.1 mg daily. Blood pressure today is normal at 130/80   History of Hyperlipidemia treated with atorvastatin  10 mg daily. 10/07/2023 lipid panel WNL   History of Diabetes Mellitus, Type II treated with 12.05-998 mg synjardy -XR BID. 10/07/2023 HgbA1c 6.3% decreased from 05/30/2023 7.0%  He had a left thalamic CVA in June 2014. 2D echo at the time showed moderate LVH and diastolic dysfunction. Renal artery duplex scan was negative for renal artery stenosis. He recovered from the stroke but still has some paresthesias in his right hand.    Last ophthalmology exam was done on 01/2023 at [facility] care    03/01/2015 Colonoscopy was normal. Repeat in ten years  Vaccine counseling: UTD on Covid-19, Received Influenza vaccine Today. Labs 10/07/2023 HgbA1c 6.3  MCHC 31.9  Otherwise WNL     Health Maintenance  Topic Date Due   COVID-19 Vaccine (5 - 2025-26 season) 09/29/2023   OPHTHALMOLOGY EXAM  02/03/2024   HEMOGLOBIN A1C  04/05/2024   Diabetic kidney evaluation - eGFR measurement  10/06/2024   Diabetic  kidney evaluation - Urine ACR  10/06/2024   FOOT EXAM  10/12/2024   Medicare Annual Wellness (AWV)  10/12/2024   Colonoscopy  02/28/2025   DTaP/Tdap/Td (3 - Td or Tdap) 10/07/2032   Pneumococcal Vaccine: 50+ Years  Completed   Influenza Vaccine  Completed   HPV VACCINES  Aged Out   Meningococcal B Vaccine  Aged Out   Hepatitis C Screening  Discontinued   Zoster Vaccines- Shingrix  Discontinued     Review of Systems  Constitutional:  Negative for fever and malaise/fatigue.  HENT:  Negative for congestion.   Eyes:  Negative for blurred vision.  Respiratory:  Negative for cough and shortness of breath.   Cardiovascular:  Negative for chest pain, palpitations and leg swelling.  Gastrointestinal:  Negative for vomiting.  Musculoskeletal:  Negative for back pain.  Skin:  Negative for rash.  Neurological:  Negative for loss of consciousness and headaches.   Objective:  Vitals: body mass index is 30.29 kg/m. Today's Vitals   10/13/23 1052  BP: 130/80  Pulse: 63  SpO2: 96%  Weight: 182 lb (82.6 kg)  Height: 5' 5 (1.651 m)  PainSc: 0-No pain   Physical Exam Constitutional:      General: He is not in acute distress.    Appearance: Normal appearance. He is not ill-appearing.  HENT:     Head: Normocephalic and atraumatic.  Cardiovascular:     Rate and Rhythm: Normal rate and regular rhythm.     Pulses:  Dorsalis pedis pulses are 3+ on the right side and 3+ on the left side.       Posterior tibial pulses are 3+ on the right side and 3+ on the left side.     Heart sounds: Normal heart sounds, S1 normal and S2 normal. No murmur heard.    No friction rub. No gallop.  Pulmonary:     Effort: Pulmonary effort is normal. No respiratory distress.     Breath sounds: Normal breath sounds. No wheezing or rales.  Skin:    General: Skin is warm and dry.  Neurological:     Mental Status: He is alert and oriented to person, place, and time. Mental status is at baseline.   Psychiatric:        Mood and Affect: Mood normal.        Behavior: Behavior normal.        Thought Content: Thought content normal.        Judgment: Judgment normal.     Current Outpatient Medications  Medication Instructions   amLODipine  (NORVASC ) 10 mg, Oral, Daily   aspirin  EC 81 mg, Oral, Daily   atorvastatin  (LIPITOR) 10 MG tablet TAKE 1 TABLET AT SUPPER FORLIPID LOWERING   blood glucose meter kit and supplies Dispense based on patient and insurance preference. Test twice daily; dx code: E11.9   Blood Glucose Monitoring Suppl (ONETOUCH VERIO FLEX SYSTEM) w/Device KIT 1 each, Does not apply, Daily, Use onetouch verio flex to check blood sugar once daily.    cloNIDine  (CATAPRES ) 0.1 mg, Oral, 2 times daily   econazole nitrate  1 % cream Topical, Daily   Empagliflozin -metFORMIN  HCl ER (SYNJARDY  XR) 12.05-998 MG TB24 2 tablets, Oral, Daily   fluticasone  (FLONASE ) 50 MCG/ACT nasal spray USE TWO SPRAY(S) IN EACH NOSTRIL ONCE DAILY   Lancets (FREESTYLE) lancets No dose, route, or frequency recorded.   losartan  (COZAAR ) 100 mg, Oral, Daily   losartan  (COZAAR ) 100 mg, Oral, Daily   nebivolol  (BYSTOLIC ) 10 mg, Oral, Daily   nebivolol  (BYSTOLIC ) 10 mg, Oral, Daily   ONETOUCH VERIO test strip USE TO CHECK BLOOD SUGAR   ONCE DAILY   Past Medical History:  Diagnosis Date   Allergy    Diabetes mellitus    Hypertension    Insomnia    LVH (left ventricular hypertrophy)    Stroke (cerebrum) (HCC) 2014   Past Surgical History:  Procedure Laterality Date   COLONOSCOPY     ELBOW SURGERY     rt elbow   HERNIA REPAIR     left inguinal   KNEE SURGERY     rt and left   Family History  Problem Relation Age of Onset   Stroke Mother    Hypertension Brother    Colon cancer Neg Hx    Diabetes Neg Hx     Social History narrative:  He is married. He retired from the [employer] where he worked for many years. 2 adult daughters and has grandchildren. He does not smoke or consume alcohol.  Mother-in-law resides in his home. She has some dementia.    Most Recent Health Risks Assessment:   Medicare Risk at Home - 10/13/23 1103     Any stairs in or around the home? No    If so, are there any without handrails? No    Home free of loose throw rugs in walkways, pet beds, electrical cords, etc? Yes    Adequate lighting in your home to reduce risk of falls? Yes  Life alert? No    Use of a cane, walker or w/c? No    Grab bars in the bathroom? No    Shower chair or bench in shower? Yes    Elevated toilet seat or a handicapped toilet? No         Most Recent Social Determinants of Health (Including Hx of Tobacco, Alcohol, and Drug Use) SDOH Screenings   Food Insecurity: No Food Insecurity (10/11/2023)  Housing: Unknown (10/11/2023)  Transportation Needs: No Transportation Needs (10/11/2023)  Utilities: Not At Risk (10/04/2022)  Alcohol Screen: Low Risk  (10/04/2022)  Depression (PHQ2-9): Low Risk  (10/13/2023)  Financial Resource Strain: Low Risk  (10/11/2023)  Physical Activity: Sufficiently Active (10/11/2023)  Social Connections: Moderately Integrated (10/11/2023)  Stress: No Stress Concern Present (10/11/2023)  Tobacco Use: Low Risk  (10/13/2023)  Health Literacy: Adequate Health Literacy (10/04/2022)   Social History   Tobacco Use   Smoking status: Never   Smokeless tobacco: Never  Vaping Use   Vaping status: Never Used  Substance Use Topics   Alcohol use: No   Drug use: No   Most Recent Functional Status Assessment:    10/13/2023   11:03 AM  In your present state of health, do you have any difficulty performing the following activities:  Hearing? 0  Vision? 0  Difficulty concentrating or making decisions? 0  Walking or climbing stairs? 0  Dressing or bathing? 0  Doing errands, shopping? 0  Preparing Food and eating ? N  Using the Toilet? N  In the past six months, have you accidently leaked urine? N  Do you have problems with loss of bowel control? N  Managing  your Medications? N  Managing your Finances? N  Housekeeping or managing your Housekeeping? N   Most Recent Fall Risk Assessment:    10/13/2023   11:06 AM  Fall Risk   Number falls in past yr: 0  Injury with Fall? 0  Risk for fall due to : No Fall Risks  Follow up Education provided;Falls prevention discussed;Falls evaluation completed   Most Recent Anxiety/Depression Screenings:    10/13/2023   11:05 AM 10/08/2022   11:03 AM  PHQ 2/9 Scores  PHQ - 2 Score 0 0       No data to display         Most Recent Cognitive Screening:    10/13/2023   11:04 AM  6CIT Screen  What Year? 0 points  What month? 0 points  What time? 0 points  Count back from 20 0 points  Months in reverse 0 points  Repeat phrase 0 points  Total Score 0 points   Most Recent Vision/Hearing Screenings:No results found. Results:  Studies Obtained And Personally Reviewed By Me: Diabetic Foot Exam - Simple   Simple Foot Form Visual Inspection No deformities, no ulcerations, no other skin breakdown bilaterally: Yes Sensation Testing Intact to touch and monofilament testing bilaterally: Yes Pulse Check Posterior Tibialis and Dorsalis pulse intact bilaterally: Yes Comments      02/01/2017Colonoscopy was normal. Repeat in ten years  Labs:  CBC w/ Differential Lab Results  Component Value Date   WBC 4.9 10/07/2023   RBC 5.64 10/07/2023   HGB 15.3 10/07/2023   HCT 48.0 10/07/2023   PLT 250 10/07/2023   MCV 85.1 10/07/2023   MCH 27.1 10/07/2023   MCHC 31.9 (L) 10/07/2023   RDW 13.8 10/07/2023   MPV 10.7 10/07/2023   LYMPHSABS 1,156 10/04/2022   MONOABS 693 07/23/2016  BASOSABS 29 10/07/2023    Comprehensive Metabolic Panel Lab Results  Component Value Date   NA 140 10/07/2023   K 4.6 10/07/2023   CL 103 10/07/2023   CO2 29 10/07/2023   GLUCOSE 96 10/07/2023   BUN 10 10/07/2023   CREATININE 0.94 10/07/2023   CALCIUM  9.5 10/07/2023   PROT 6.3 10/07/2023   ALBUMIN 4.5 09/08/2018    AST 16 10/07/2023   ALT 20 10/07/2023   ALKPHOS 64 09/08/2018   BILITOT 1.0 10/07/2023   GFR 75.93 12/02/2022   EGFR 87 10/07/2023   GFRNONAA 82 09/20/2019   Lipid Panel  Lab Results  Component Value Date   CHOL 118 10/07/2023   HDL 46 10/07/2023   LDLCALC 57 10/07/2023   TRIG 72 10/07/2023   A1c Lab Results  Component Value Date   HGBA1C 6.3 (H) 10/07/2023    TSH No results found for: TSH PSA  1.46 10/07/23  No results found for any visits on 10/13/23. Assessment & Plan:   Orders Placed This Encounter  Procedures   Flu vaccine HIGH DOSE PF(Fluzone Trivalent)   Hypertension: treated with Cozaar  100 mg daily, Norvasc  10 mg daily, Bystolic  10 mg daily, Catapres  0.1 mg daily. Blood pressure today is normal at 130/80   Hyperlipidemia: treated with atorvastatin  10 mg daily. 10/07/2023 lipid panel WNL   Diabetes Mellitus, Type II: treated with 12.05-998 mg synjardy -XR BID. 10/07/2023 HgbA1c 6.3% decreased from 05/30/2023 7.0%  He had a left thalamic CVA in June 2014. 2D echo at the time showed moderate LVH and diastolic dysfunction. Renal artery duplex scan was negative for renal artery stenosis. He recovered from the stroke but still has some paresthesias in his right hand.    Last ophthalmology exam was done on 01/2023 at [facility] care    03/01/2015 Colonoscopy was normal. Repeat in ten years  Vaccine counseling: UTD on Covid-19 vaccine, Received influenza vaccine today.   Labs 10/07/2023 HgbA1c 6.3  MCHC 31.9  Otherwise WNL       Return in about 6 months (around 04/11/2024).   Annual Wellness Visit done today including the all of the following: Reviewed patient's Family Medical History Reviewed patient's SDOH and reviewed tobacco, alcohol, and drug use.  Reviewed and updated list of patient's medical providers Assessment of cognitive impairment was done Assessed patient's functional ability Established a written schedule for health screening  services Health Risk Assessent Completed and Reviewed  Discussed health benefits of physical activity, and encouraged him to engage in regular exercise appropriate for his age and condition.    I,Makayla C Reid,acting as a scribe for Ronal JINNY Hailstone, MD.,have documented all relevant documentation on the behalf of Ronal JINNY Hailstone, MD,as directed by  Ronal JINNY Hailstone, MD while in the presence of Ronal JINNY Hailstone, MD.   I, Ronal JINNY Hailstone, MD, have reviewed all documentation for and agree with the above Annual Wellness Visit documentation.  Ronal JINNY Hailstone, MD Internal Medicine 10/13/2023

## 2023-10-13 NOTE — Patient Instructions (Signed)
Next appointment: Follow up in one year for your annual wellness visit.  Preventive Care 2 Years and Older, Male Preventive care refers to lifestyle choices and visits with your health care provider that can promote health and wellness. What does preventive care include? A yearly physical exam. This is also called an annual well check. Dental exams once or twice a year. Routine eye exams. Ask your health care provider how often you should have your eyes checked. Personal lifestyle choices, including: Daily care of your teeth and gums. Regular physical activity. Eating a healthy diet. Avoiding tobacco and drug use. Limiting alcohol use. Practicing safe sex. Taking low doses of aspirin every day. Taking vitamin and mineral supplements as recommended by your health care provider. What happens during an annual well check? The services and screenings done by your health care provider during your annual well check will depend on your age, overall health, lifestyle risk factors, and family history of disease. Counseling  Your health care provider may ask you questions about your: Alcohol use. Tobacco use. Drug use. Emotional well-being. Home and relationship well-being. Sexual activity. Eating habits. History of falls. Memory and ability to understand (cognition). Work and work Astronomer. Screening  You may have the following tests or measurements: Height, weight, and BMI. Blood pressure. Lipid and cholesterol levels. These may be checked every 5 years, or more frequently if you are over 48 years old. Skin check. Lung cancer screening. You may have this screening every year starting at age 16 if you have a 30-pack-year history of smoking and currently smoke or have quit within the past 15 years. Fecal occult blood test (FOBT) of the stool. You may have this test every year starting at age 13. Flexible sigmoidoscopy or colonoscopy. You may have a sigmoidoscopy every 5 years or a  colonoscopy every 10 years starting at age 54. Prostate cancer screening. Recommendations will vary depending on your family history and other risks. Hepatitis C blood test. Hepatitis B blood test. Sexually transmitted disease (STD) testing. Diabetes screening. This is done by checking your blood sugar (glucose) after you have not eaten for a while (fasting). You may have this done every 1-3 years. Abdominal aortic aneurysm (AAA) screening. You may need this if you are a current or former smoker. Osteoporosis. You may be screened starting at age 64 if you are at high risk. Talk with your health care provider about your test results, treatment options, and if necessary, the need for more tests. Vaccines  Your health care provider may recommend certain vaccines, such as: Influenza vaccine. This is recommended every year. Tetanus, diphtheria, and acellular pertussis (Tdap, Td) vaccine. You may need a Td booster every 10 years. Zoster vaccine. You may need this after age 10. Pneumococcal 13-valent conjugate (PCV13) vaccine. One dose is recommended after age 63. Pneumococcal polysaccharide (PPSV23) vaccine. One dose is recommended after age 65. Talk to your health care provider about which screenings and vaccines you need and how often you need them. This information is not intended to replace advice given to you by your health care provider. Make sure you discuss any questions you have with your health care provider. Document Released: 02/10/2015 Document Revised: 10/04/2015 Document Reviewed: 11/15/2014 Elsevier Interactive Patient Education  2017 [employer].  Fall Prevention in the Home Falls can cause injuries. They can happen to people of all ages. There are many things you can do to make your home safe and to help prevent falls. What can I do on  the outside of my home? Regularly fix the edges of walkways and driveways and fix any cracks. Remove anything that might make you trip as you  walk through a door, such as a raised step or threshold. Trim any bushes or trees on the path to your home. Use bright outdoor lighting. Clear any walking paths of anything that might make someone trip, such as rocks or tools. Regularly check to see if handrails are loose or broken. Make sure that both sides of any steps have handrails. Any raised decks and porches should have guardrails on the edges. Have any leaves, snow, or ice cleared regularly. Use sand or salt on walking paths during winter. Clean up any spills in your garage right away. This includes oil or grease spills. What can I do in the bathroom? Use night lights. Install grab bars by the toilet and in the tub and shower. Do not use towel bars as grab bars. Use non-skid mats or decals in the tub or shower. If you need to sit down in the shower, use a plastic, non-slip stool. Keep the floor dry. Clean up any water that spills on the floor as soon as it happens. Remove soap buildup in the tub or shower regularly. Attach bath mats securely with double-sided non-slip rug tape. Do not have throw rugs and other things on the floor that can make you trip. What can I do in the bedroom? Use night lights. Make sure that you have a light by your bed that is easy to reach. Do not use any sheets or blankets that are too big for your bed. They should not hang down onto the floor. Have a firm chair that has side arms. You can use this for support while you get dressed. Do not have throw rugs and other things on the floor that can make you trip. What can I do in the kitchen? Clean up any spills right away. Avoid walking on wet floors. Keep items that you use a lot in easy-to-reach places. If you need to reach something above you, use a strong step stool that has a grab bar. Keep electrical cords out of the way. Do not use floor polish or wax that makes floors slippery. If you must use wax, use non-skid floor wax. Do not have throw rugs  and other things on the floor that can make you trip. What can I do with my stairs? Do not leave any items on the stairs. Make sure that there are handrails on both sides of the stairs and use them. Fix handrails that are broken or loose. Make sure that handrails are as long as the stairways. Check any carpeting to make sure that it is firmly attached to the stairs. Fix any carpet that is loose or worn. Avoid having throw rugs at the top or bottom of the stairs. If you do have throw rugs, attach them to the floor with carpet tape. Make sure that you have a light switch at the top of the stairs and the bottom of the stairs. If you do not have them, ask someone to add them for you. What else can I do to help prevent falls? Wear shoes that: Do not have high heels. Have rubber bottoms. Are comfortable and fit you well. Are closed at the toe. Do not wear sandals. If you use a stepladder: Make sure that it is fully opened. Do not climb a closed stepladder. Make sure that both sides of the stepladder are locked  into place. Ask someone to hold it for you, if possible. Clearly mark and make sure that you can see: Any grab bars or handrails. First and last steps. Where the edge of each step is. Use tools that help you move around (mobility aids) if they are needed. These include: Canes. Walkers. Scooters. Crutches. Turn on the lights when you go into a dark area. Replace any light bulbs as soon as they burn out. Set up your furniture so you have a clear path. Avoid moving your furniture around. If any of your floors are uneven, fix them. If there are any pets around you, be aware of where they are. Review your medicines with your doctor. Some medicines can make you feel dizzy. This can increase your chance of falling. Ask your doctor what other things that you can do to help prevent falls. This information is not intended to replace advice given to you by your health care provider. Make sure  you discuss any questions you have with your health care provider. Document Released: 11/10/2008 Document Revised: 06/22/2015 Document Reviewed: 02/18/2014 Elsevier Interactive Patient Education  2017 [employer].

## 2023-10-13 NOTE — Progress Notes (Unsigned)
 Subjective:   Mike Parker is a 73 y.o. male who presents for Medicare Annual/Subsequent preventive examination.  Visit Complete: In person  Patient Medicare AWV questionnaire was completed by the patient on 10/13/2023; I have confirmed that all information answered by patient is correct and no changes since this date.  Cardiac Risk Factors include: advanced age (>60men, >32 women);diabetes mellitus;hypertension;male gender     Objective:    Today's Vitals   10/13/23 1052  BP: 130/80  Pulse: 63  SpO2: 96%  Weight: 182 lb (82.6 kg)  Height: 5' 5 (1.651 m)  PainSc: 0-No pain   Body mass index is 30.29 kg/m.     10/13/2023   11:06 AM 10/08/2022   11:07 AM 10/02/2021   10:51 AM 01/18/2016    9:58 AM 09/14/2015    3:49 PM 02/15/2015    1:11 PM 05/05/2014    8:34 PM  Advanced Directives  Does Patient Have a Medical Advance Directive? Yes Yes No No  No  No  No   Type of Advance Directive Living will;Healthcare Power of [employer] Power of Attorney       Copy of Healthcare Power of Attorney in Chart? No - copy requested No - copy requested       Would patient like information on creating a medical advance directive?   No - Patient declined No - Patient declined  No - patient declined information   No - patient declined information      Data saved with a previous flowsheet row definition    Current Medications (verified) Outpatient Encounter Medications as of 10/13/2023  Medication Sig   amLODipine  (NORVASC ) 10 MG tablet TAKE 1 TABLET DAILY   aspirin  EC 81 MG EC tablet Take 1 tablet (81 mg total) by mouth daily.   atorvastatin  (LIPITOR) 10 MG tablet TAKE 1 TABLET AT SUPPER FORLIPID LOWERING   blood glucose meter kit and supplies Dispense based on patient and insurance preference. Test twice daily; dx code: E11.9   Blood Glucose Monitoring Suppl (ONETOUCH VERIO FLEX SYSTEM) w/Device KIT 1 each by Does not apply route daily. Use onetouch verio flex to check blood sugar  once daily.   cloNIDine  (CATAPRES ) 0.1 MG tablet Take 1 tablet (0.1 mg total) by mouth 2 (two) times daily.   econazole nitrate  1 % cream Apply topically daily.   Empagliflozin -metFORMIN  HCl ER (SYNJARDY  XR) 12.05-998 MG TB24 Take 2 tablets by mouth daily.   fluticasone  (FLONASE ) 50 MCG/ACT nasal spray USE TWO SPRAY(S) IN EACH NOSTRIL ONCE DAILY   Lancets (FREESTYLE) lancets    losartan  (COZAAR ) 100 MG tablet TAKE 1 TABLET DAILY   losartan  (COZAAR ) 100 MG tablet Take 1 tablet (100 mg total) by mouth daily.   nebivolol  (BYSTOLIC ) 10 MG tablet TAKE 1 TABLET DAILY   nebivolol  (BYSTOLIC ) 10 MG tablet Take 1 tablet (10 mg total) by mouth daily.   ONETOUCH VERIO test strip USE TO CHECK BLOOD SUGAR   ONCE DAILY   No facility-administered encounter medications on file as of 10/13/2023.    Allergies (verified) Patient has no known allergies.   History: Past Medical History:  Diagnosis Date   Allergy    Diabetes mellitus    Hypertension    Insomnia    LVH (left ventricular hypertrophy)    Stroke (cerebrum) (HCC) 2014   Past Surgical History:  Procedure Laterality Date   COLONOSCOPY     ELBOW SURGERY     rt elbow   HERNIA REPAIR  left inguinal   KNEE SURGERY     rt and left   Family History  Problem Relation Age of Onset   Stroke Mother    Hypertension Brother    Colon cancer Neg Hx    Diabetes Neg Hx    Social History   Socioeconomic History   Marital status: Married    Spouse name: Not on file   Number of children: 2   Years of education: BS   Highest education level: Bachelor's degree (e.g., BA, AB, BS)  Occupational History   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[employer]  Tobacco Use   Smoking status: Never   Smokeless tobacco: Never  Vaping Use   Vaping status: Never Used  Substance and Sexual Activity   Alcohol use: No   Drug use: No   Sexual activity: Yes  Other Topics Concern   Not on file  Social History Narrative   Not on file   Social Drivers of Health    Financial Resource Strain: Low Risk  (10/11/2023)   Overall Financial Resource Strain (CARDIA)    Difficulty of Paying Living Expenses: Not hard at all  Food Insecurity: No Food Insecurity (10/11/2023)   Hunger Vital Sign    Worried About Running Out of Food in the Last Year: Never true    Ran Out of Food in the Last Year: Never true  Transportation Needs: No Transportation Needs (10/11/2023)   PRAPARE - Administrator, Civil Service (Medical): No    Lack of Transportation (Non-Medical): No  Physical Activity: Sufficiently Active (10/11/2023)   Exercise Vital Sign    Days of Exercise per Week: 4 days    Minutes of Exercise per Session: 60 min  Stress: No Stress Concern Present (10/11/2023)   [employer] of Occupational Health - Occupational Stress Questionnaire    Feeling of Stress: Not at all  Social Connections: Moderately Integrated (10/11/2023)   Social Connection and Isolation Panel    Frequency of Communication with Friends and Family: More than three times a week    Frequency of Social Gatherings with Friends and Family: Once a week    Attends Religious Services: Never    Database administrator or Organizations: Yes    Attends Banker Meetings: 1 to 4 times per year    Marital Status: Married    Tobacco Counseling Counseling given: Not Answered   Clinical Intake:     Pain Score: 0-No pain                  Activities of Daily Living    10/13/2023   11:03 AM  In your present state of health, do you have any difficulty performing the following activities:  Hearing? 0  Vision? 0  Difficulty concentrating or making decisions? 0  Walking or climbing stairs? 0  Dressing or bathing? 0  Doing errands, shopping? 0  Preparing Food and eating ? N  Using the Toilet? N  In the past six months, have you accidently leaked urine? N  Do you have problems with loss of bowel control? N  Managing your Medications? N  Managing your  Finances? N  Housekeeping or managing your Housekeeping? N    Patient Care Team: Perri Ronal PARAS, MD as PCP - General (Internal Medicine)  Indicate any recent Medical Services you may have received from other than [facility] providers in the past year (date may be approximate).     Assessment:   This is a routine wellness examination for  Britian.  Hearing/Vision screen No results found.   Goals Addressed   None    Depression Screen    10/13/2023   11:05 AM 10/08/2022   11:03 AM 04/05/2022    9:39 AM 10/02/2021   10:50 AM 09/28/2020   10:03 AM 03/27/2020   10:53 AM 09/27/2019   10:03 AM  PHQ 2/9 Scores  PHQ - 2 Score 0 0 0 0 0 0 0    Fall Risk    10/13/2023   11:06 AM 10/08/2022   11:04 AM 10/04/2022   10:19 AM 10/04/2022   10:13 AM 04/05/2022    9:39 AM  Fall Risk   Falls in the past year?  0 0 0 0  Number falls in past yr: 0 0  0 0  Injury with Fall? 0 0  1 0  Risk for fall due to : No Fall Risks No Fall Risks   No Fall Risks  Follow up Education provided;Falls prevention discussed;Falls evaluation completed    Falls prevention discussed    MEDICARE RISK AT HOME: Medicare Risk at Home Any stairs in or around the home?: No If so, are there any without handrails?: No Home free of loose throw rugs in walkways, pet beds, electrical cords, etc?: Yes Adequate lighting in your home to reduce risk of falls?: Yes Life alert?: No Use of a cane, walker or w/c?: No Grab bars in the bathroom?: No Shower chair or bench in shower?: Yes Elevated toilet seat or a handicapped toilet?: No  TIMED UP AND GO:  Was the test performed?  No    Cognitive Function:        10/13/2023   11:04 AM 10/08/2022   11:05 AM 10/02/2021   10:52 AM  6CIT Screen  What Year? 0 points 0 points 0 points  What month? 0 points 0 points 0 points  What time? 0 points 0 points 0 points  Count back from 20 0 points 0 points 0 points  Months in reverse 0 points 0 points 0 points  Repeat phrase 0 points 0 points 0  points  Total Score 0 points 0 points 0 points    Immunizations Immunization History  Administered Date(s) Administered   INFLUENZA, HIGH DOSE SEASONAL PF 10/28/2016   Influenza Split 01/07/2012   Influenza, Seasonal, Injecte, Preservative Fre 10/08/2022   Influenza,inj,Quad PF,6+ Mos 01/05/2013, 11/09/2014, 10/10/2015, 09/30/2018, 09/27/2019, 11/09/2021   Influenza-Unspecified 11/28/2013, 12/10/2017   PFIZER(Purple Top)SARS-COV-2 Vaccination 03/22/2019, 04/02/2019, 11/05/2019   Pneumococcal Conjugate-13 03/10/2014, 07/28/2017   Pneumococcal Polysaccharide-23 11/06/2004, 09/22/2018   Tdap 12/11/2009, 10/08/2022   Unspecified SARS-COV-2 Vaccination 11/05/2021   Zoster, Live 01/23/2015    TDAP status: Up to date  Flu Vaccine status: Completed at today's visit  Pneumococcal vaccine status: Up to date  Covid-19 vaccine status: Information provided on how to obtain vaccines.   Qualifies for Shingles Vaccine? Yes   Zostavax completed Yes   Shingrix Completed?: No.    Education has been provided regarding the importance of this vaccine. Patient has been advised to call insurance company to determine out of pocket expense if they have not yet received this vaccine. Advised may also receive vaccine at local pharmacy or Health Dept. Verbalized acceptance and understanding.  Screening Tests Health Maintenance  Topic Date Due   Influenza Vaccine  08/29/2023   COVID-19 Vaccine (5 - 2025-26 season) 09/29/2023   OPHTHALMOLOGY EXAM  02/03/2024   HEMOGLOBIN A1C  04/05/2024   Diabetic kidney evaluation - eGFR measurement  10/06/2024  Diabetic kidney evaluation - Urine ACR  10/06/2024   Medicare Annual Wellness (AWV)  10/06/2024   FOOT EXAM  10/12/2024   Colonoscopy  02/28/2025   DTaP/Tdap/Td (3 - Td or Tdap) 10/07/2032   Pneumococcal Vaccine: 50+ Years  Completed   HPV VACCINES  Aged Out   Meningococcal B Vaccine  Aged Out   Hepatitis C Screening  Discontinued   Zoster Vaccines-  Shingrix  Discontinued    Health Maintenance  Health Maintenance Due  Topic Date Due   Influenza Vaccine  08/29/2023   COVID-19 Vaccine (5 - 2025-26 season) 09/29/2023    Colorectal cancer screening: Type of screening: Cologuard. Completed 03/01/2015. Repeat every 10 years  Lung Cancer Screening: (Low Dose CT Chest recommended if Age 28-80 years, 20 pack-year currently smoking OR have quit w/in 15years.) does not qualify.   Additional Screening:  Hepatitis C Screening: does not qualify; Completed   Vision Screening: Recommended annual ophthalmology exams for early detection of glaucoma and other disorders of the eye. Is the patient up to date with their annual eye exam?  Yes  Who is the provider or what is the name of the office in which the patient attends annual eye exams? [facility]  If pt is not established with a provider, would they like to be referred to a provider to establish care? No .   Dental Screening: Recommended annual dental exams for proper oral hygiene  Diabetic Foot Exam: Diabetic Foot Exam: Completed 10/08/2023  Community Resource Referral / Chronic Care Management: CRR required this visit?  No   CCM required this visit?  No     Plan:     I have personally reviewed and noted the following in the patient's chart:   Medical and social history Use of alcohol, tobacco or illicit drugs  Current medications and supplements including opioid prescriptions. Patient is not currently taking opioid prescriptions. Functional ability and status Nutritional status Physical activity Advanced directives List of other physicians Hospitalizations, surgeries, and ER visits in previous 12 months Vitals Screenings to include cognitive, depression, and falls Referrals and appointments  In addition, I have reviewed and discussed with patient certain preventive protocols, quality metrics, and best practice recommendations. A written personalized care plan  for preventive services as well as general preventive health recommendations were provided to patient.     Araceli Zelda, CMA   10/13/2023   After Visit Summary: (In Person-Printed) AVS printed and given to the patient  I, Ronal JINNY Hailstone, MD, have reviewed all documentation for this visit. The documentation on 10/13/2023 for the exam, diagnosis, procedures, and orders are all accurate and complete.   I have reviewed and agree with the above Annual Wellness Visit documentation.  Ronal Norleen Hailstone, MD. Internal Medicine 10/13/2023

## 2023-10-14 ENCOUNTER — Encounter: Payer: Self-pay | Admitting: Internal Medicine

## 2023-11-25 ENCOUNTER — Ambulatory Visit: Attending: Internal Medicine | Admitting: Internal Medicine

## 2023-11-25 ENCOUNTER — Encounter: Payer: Self-pay | Admitting: Internal Medicine

## 2023-11-25 VITALS — BP 108/60 | HR 68 | Ht 65.0 in | Wt 179.2 lb

## 2023-11-25 DIAGNOSIS — R9431 Abnormal electrocardiogram [ECG] [EKG]: Secondary | ICD-10-CM | POA: Diagnosis not present

## 2023-11-25 DIAGNOSIS — I10 Essential (primary) hypertension: Secondary | ICD-10-CM | POA: Insufficient documentation

## 2023-11-25 DIAGNOSIS — E119 Type 2 diabetes mellitus without complications: Secondary | ICD-10-CM | POA: Diagnosis not present

## 2023-11-25 DIAGNOSIS — I639 Cerebral infarction, unspecified: Secondary | ICD-10-CM

## 2023-11-25 DIAGNOSIS — E785 Hyperlipidemia, unspecified: Secondary | ICD-10-CM | POA: Diagnosis not present

## 2023-11-25 NOTE — Progress Notes (Signed)
 Cardiology Office Note:  .   Date:  11/25/2023  ID:  Mike Parker, DOB November 02, 1950, MRN [id number] PCP: Perri Ronal PARAS, MD  [facility] Providers Cardiologist:  Soyla DELENA Merck, MD    History of Present Illness: .   Mike Parker is a 73 y.o. male.  Discussed the use of AI scribe software for clinical note transcription with the patient, who gave verbal consent to proceed.  History of Present Illness Mike Parker is a 73 year old male with hypertension, diabetes mellitus type 2, and a history of thalamic CVA who presents for a cardiovascular follow-up.  He is currently asymptomatic with no chest pain, shortness of breath, palpitations, or syncope over the past year. Blood pressure is well controlled without causing lightheadedness or dizziness.  Hypertension is managed with Cozaar  100 mg daily, Norvasc  10 mg daily, Bystolic  10 mg daily, and Catapres  0.1 mg twice daily, with no changes in medication since last year.  For hyperlipidemia, he continues on Lipitor 10 mg daily. Recent cholesterol labs show an LDL of 57, triglycerides of 72, HDL of 46, and total cholesterol of 118.  Diabetes mellitus type 2 is managed with Synjardy  (empagliflozin /metformin ) 12.05/998 mg daily. His last hemoglobin A1c was 6.3, with no episodes of hypoglycemia.  He has a history of a thalamic CVA in June 2014 and continues on Lipitor 10 mg daily and ASA 81 mg daily. He has not experienced any new neurological symptoms.    ROS: negative except per HPI above.  Studies Reviewed: SABRA   EKG Interpretation Date/Time:  Tuesday November 25 2023 09:34:00 EDT Ventricular Rate:  63 PR Interval:  162 QRS Duration:  68 QT Interval:  408 QTC Calculation: 417 R Axis:   -13  Text Interpretation: Normal sinus rhythm Cannot rule out Inferior infarct , age undetermined When compared with ECG of 04-Oct-2022 10:42, No significant change was found Confirmed by Merck Soyla ([id number]) on 11/25/2023 9:39:21 AM     Results LABS LDL: 57 (09/2023) Triglycerides: 72 (09/2023) HDL: 46 (09/2023) Total cholesterol: 118 (09/2023) HbA1c: 6.3 Creatinine: 0.9  DIAGNOSTIC EKG: Artifact suggesting old myocardial infarction Risk Assessment/Calculations:       Physical Exam:   VS:  BP 108/60 (BP Location: Right Arm, Patient Position: Sitting)   Pulse 68   Ht 5' 5 (1.651 m)   Wt 179 lb 3.2 oz (81.3 kg)   SpO2 96%   BMI 29.82 kg/m    Wt Readings from Last 3 Encounters:  11/25/23 179 lb 3.2 oz (81.3 kg)  10/13/23 182 lb (82.6 kg)  06/04/23 185 lb 3.2 oz (84 kg)     Physical Exam GENERAL: Alert, cooperative, well developed, no acute distress. HEENT: Normocephalic, normal oropharynx, moist mucous membranes. CHEST: Clear to auscultation bilaterally, no wheezes, rhonchi, or crackles. CARDIOVASCULAR: Normal heart rate and rhythm, S1 and S2 normal without murmurs, no carotid bruit. ABDOMEN: Soft, non-tender, non-distended, without organomegaly, normal bowel sounds. EXTREMITIES: No cyanosis or edema. NEUROLOGICAL: Cranial nerves grossly intact, moves all extremities without gross motor or sensory deficit.   ASSESSMENT AND PLAN: .    Assessment and Plan Assessment & Plan Abnormal electrocardiogram (EKG), possible artifact - Order echocardiogram to assess cardiac function and confirm EKG findings.  Essential hypertension Hypertension well-controlled with current regimen. Blood pressure slightly low but asymptomatic. - Continue Cozaar  100 mg daily, Norvasc  10 mg daily, Bystolic  10 mg daily, Catapres  0.1 mg twice daily. - Monitor for hypotension symptoms.  Hyperlipidemia Hyperlipidemia well-managed with atorvastatin . Lipid  panel indicates excellent control. - Continue atorvastatin  10 mg daily.  Thalamic cerebrovascular accident (CVA) Thalamic CVA in 2014. No new neurological symptoms. On aspirin  and atorvastatin  for secondary prevention. - Continue aspirin  81 mg daily. - Continue atorvastatin   10 mg daily.  Diabetes mellitus type 2 Diabetes managed by primary care with good control.  - Continue empagliflozin /metformin  (Synjardy ) as prescribed by primary care. - Coordinate with primary care and endocrinologist as needed.        Soyla Merck, MD, FACC

## 2023-11-25 NOTE — Patient Instructions (Signed)
 Medication Instructions:  No medication changes were made at this visit. Continue current regimen.   *If you need a refill on your cardiac medications before your next appointment, please call your pharmacy*  Lab Work: None ordered today. If you have labs (blood work) drawn today and your tests are completely normal, you will receive your results only by: MyChart Message (if you have MyChart) OR A paper copy in the mail If you have any lab test that is abnormal or we need to change your treatment, we will call you to review the results.  Testing/Procedures: Your physician has requested that you have an echocardiogram at next available appointment slot. Echocardiography is a painless test that uses sound waves to create images of your heart. It provides your doctor with information about the size and shape of your heart and how well your heart's chambers and valves are working. This procedure takes approximately one hour. There are no restrictions for this procedure. Please do NOT wear cologne, perfume, aftershave, or lotions (deodorant is allowed). Please arrive 15 minutes prior to your appointment time.  Please note: We ask at that you not bring children with you during ultrasound (echo/ vascular) testing. Due to room size and safety concerns, children are not allowed in the ultrasound rooms during exams. Our front office staff cannot provide observation of children in our lobby area while testing is being conducted. An adult accompanying a patient to their appointment will only be allowed in the ultrasound room at the discretion of the ultrasound technician under special circumstances. We apologize for any inconvenience.   Follow-Up: At [facility], you and your health needs are our priority.  As part of our continuing mission to provide you with exceptional heart care, our providers are all part of one team.  This team includes your primary Cardiologist (physician) and Advanced  Practice Providers or APPs (Physician Assistants and Nurse Practitioners) who all work together to provide you with the care you need, when you need it.  Your next appointment:   1 year(s)  Provider:   Gayatri A Acharya, MD

## 2023-11-28 DIAGNOSIS — M1712 Unilateral primary osteoarthritis, left knee: Secondary | ICD-10-CM | POA: Diagnosis not present

## 2023-12-02 ENCOUNTER — Other Ambulatory Visit

## 2023-12-02 DIAGNOSIS — E1165 Type 2 diabetes mellitus with hyperglycemia: Secondary | ICD-10-CM | POA: Diagnosis not present

## 2023-12-03 LAB — BASIC METABOLIC PANEL WITHOUT GFR
BUN: 15 mg/dL (ref 7–25)
CO2: 27 mmol/L (ref 20–32)
Calcium: 9.4 mg/dL (ref 8.6–10.3)
Chloride: 102 mmol/L (ref 98–110)
Creat: 0.97 mg/dL (ref 0.70–1.28)
Glucose, Bld: 96 mg/dL (ref 65–99)
Potassium: 4.2 mmol/L (ref 3.5–5.3)
Sodium: 140 mmol/L (ref 135–146)

## 2023-12-03 LAB — ABN TEST REFUSAL: ABN TEST REFUSED: 16802

## 2023-12-03 LAB — MICROALBUMIN / CREATININE URINE RATIO
Creatinine, Urine: 110 mg/dL (ref 20–320)
Microalb Creat Ratio: 7 mg/g{creat} (ref <30)
Microalb, Ur: 0.8 mg/dL

## 2023-12-04 ENCOUNTER — Ambulatory Visit: Payer: Self-pay | Admitting: Endocrinology

## 2023-12-05 ENCOUNTER — Ambulatory Visit: Admitting: Endocrinology

## 2023-12-05 ENCOUNTER — Encounter: Payer: Self-pay | Admitting: Endocrinology

## 2023-12-05 VITALS — BP 122/74 | HR 61 | Resp 20 | Ht 65.5 in | Wt 182.0 lb

## 2023-12-05 DIAGNOSIS — Z7985 Long-term (current) use of injectable non-insulin antidiabetic drugs: Secondary | ICD-10-CM | POA: Diagnosis not present

## 2023-12-05 DIAGNOSIS — E119 Type 2 diabetes mellitus without complications: Secondary | ICD-10-CM | POA: Diagnosis not present

## 2023-12-05 NOTE — Progress Notes (Signed)
 Outpatient Endocrinology Note Mike Coudriet, MD  12/05/23  Patient's Name: Mike Parker    DOB: 02-11-1950    MRN: [id number]                                                    REASON OF VISIT: Follow up of type 2 diabetes mellitus  PCP: Perri Ronal PARAS, MD  HISTORY OF PRESENT ILLNESS:   Mike Parker is a 73 y.o. old male with past medical history listed below, is here for follow up for type 2 diabetes mellitus.   Pertinent Diabetes History: Patient was diagnosed with type 2 diabetes mellitus around 2011.  Patient has controlled type 2 diabetes mellitus.  Chronic Diabetes Complications : Retinopathy: no. Last ophthalmology exam was done on annually, 01/2023, following with ophthalmology regularly.  Nephropathy: no, on ACE/ARB /losartan . Peripheral neuropathy: no Coronary artery disease: no Stroke: yes  Relevant comorbidities and cardiovascular risk factors: Obesity: no Body mass index is 29.83 kg/m.  Hypertension: Yes  Hyperlipidemia : Yes, on statin   Current / Home Diabetic regimen includes:  Synjardy  12.05/998 mg 2 tablets with breakfast.  Prior diabetic medications: Glipizide  and Janumet  in the past.  Glycemic data:  One Touch Verio Flex glucometer.  Download from October 24 to December 05, 2023, average blood sugar 130, highest blood sugar 206 and lowest blood sugar 98.  He has been checking mostly once a day in the afternoon and in the evening after eating, some of the blood sugar 123, 101, 119, 176, 134, 120, 104, 206, 142, 98, 128, 103.    Hypoglycemia: Patient has no hypoglycemic episodes. Patient has hypoglycemia awareness.  Factors modifying glucose control: 1.  Diabetic diet assessment: 3 meals a day.  2.  Staying active or exercising:   3.  Medication compliance: compliant all of the time.  Interval history  Glucometer data as reviewed above.  Mostly acceptable blood sugar.  He had hemoglobin A1c 6.3% in September.  Recent laboratory results reviewed,  acceptable cholesterol level, electrolytes, renal function and urine microalbumin creatinine ratio.  No numbness and ting of the feet.  No vision problem.  No other complaints today.    REVIEW OF SYSTEMS As per history of present illness.   PAST MEDICAL HISTORY: Past Medical History:  Diagnosis Date   Allergy    Diabetes mellitus    Hypertension    Insomnia    LVH (left ventricular hypertrophy)    Stroke (cerebrum) (HCC) 2014    PAST SURGICAL HISTORY: Past Surgical History:  Procedure Laterality Date   COLONOSCOPY     ELBOW SURGERY     rt elbow   HERNIA REPAIR     left inguinal   KNEE SURGERY     rt and left    ALLERGIES: No Known Allergies  FAMILY HISTORY:  Family History  Problem Relation Age of Onset   Stroke Mother    Hypertension Brother    Colon cancer Neg Hx    Diabetes Neg Hx     SOCIAL HISTORY: Social History   Socioeconomic History   Marital status: Married    Spouse name: Not on file   Number of children: 2   Years of education: BS   Highest education level: Bachelor's degree (e.g., BA, AB, BS)  Occupational History   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[employer]  Tobacco Use  Smoking status: Never   Smokeless tobacco: Never  Vaping Use   Vaping status: Never Used  Substance and Sexual Activity   Alcohol use: No   Drug use: No   Sexual activity: Yes  Other Topics Concern   Not on file  Social History Narrative   Not on file   Social Drivers of Health   Financial Resource Strain: Low Risk  (10/11/2023)   Overall Financial Resource Strain (CARDIA)    Difficulty of Paying Living Expenses: Not hard at all  Food Insecurity: No Food Insecurity (10/11/2023)   Hunger Vital Sign    Worried About Running Out of Food in the Last Year: Never true    Ran Out of Food in the Last Year: Never true  Transportation Needs: No Transportation Needs (10/11/2023)   PRAPARE - Administrator, Civil Service (Medical): No    Lack of Transportation  (Non-Medical): No  Physical Activity: Sufficiently Active (10/11/2023)   Exercise Vital Sign    Days of Exercise per Week: 4 days    Minutes of Exercise per Session: 60 min  Stress: No Stress Concern Present (10/11/2023)   [employer] of Occupational Health - Occupational Stress Questionnaire    Feeling of Stress: Not at all  Social Connections: Moderately Integrated (10/11/2023)   Social Connection and Isolation Panel    Frequency of Communication with Friends and Family: More than three times a week    Frequency of Social Gatherings with Friends and Family: Once a week    Attends Religious Services: Never    Database Administrator or Organizations: Yes    Attends Engineer, Structural: 1 to 4 times per year    Marital Status: Married    MEDICATIONS:  Current Outpatient Medications  Medication Sig Dispense Refill   amLODipine  (NORVASC ) 10 MG tablet TAKE 1 TABLET DAILY 90 tablet 3   aspirin  EC 81 MG EC tablet Take 1 tablet (81 mg total) by mouth daily. 30 tablet 3   atorvastatin  (LIPITOR) 10 MG tablet TAKE 1 TABLET AT SUPPER FORLIPID LOWERING 90 tablet 3   blood glucose meter kit and supplies Dispense based on patient and insurance preference. Test twice daily; dx code: E11.9 1 each 0   Blood Glucose Monitoring Suppl (ONETOUCH VERIO FLEX SYSTEM) w/Device KIT 1 each by Does not apply route daily. Use onetouch verio flex to check blood sugar once daily. 1 kit 0   cloNIDine  (CATAPRES ) 0.1 MG tablet Take 1 tablet (0.1 mg total) by mouth 2 (two) times daily. 180 tablet 3   econazole nitrate  1 % cream Apply topically daily. (Patient not taking: Reported on 11/25/2023) 15 g 5   Empagliflozin -metFORMIN  HCl ER (SYNJARDY  XR) 12.05-998 MG TB24 Take 2 tablets by mouth daily. 180 tablet 4   fluticasone  (FLONASE ) 50 MCG/ACT nasal spray USE TWO SPRAY(S) IN EACH NOSTRIL ONCE DAILY 16 g 11   Lancets (FREESTYLE) lancets      losartan  (COZAAR ) 100 MG tablet TAKE 1 TABLET DAILY 90 tablet 3    nebivolol  (BYSTOLIC ) 10 MG tablet TAKE 1 TABLET DAILY 90 tablet 3   ONETOUCH VERIO test strip USE TO CHECK BLOOD SUGAR   ONCE DAILY 100 strip 2   No current facility-administered medications for this visit.    PHYSICAL EXAM: Vitals:   12/05/23 0849  BP: 122/74  Pulse: 61  Resp: 20  SpO2: 98%  Weight: 182 lb (82.6 kg)  Height: 5' 5.5 (1.664 m)     Body mass  index is 29.83 kg/m.  Wt Readings from Last 3 Encounters:  12/05/23 182 lb (82.6 kg)  11/25/23 179 lb 3.2 oz (81.3 kg)  10/13/23 182 lb (82.6 kg)    General: Well developed, well nourished male in no apparent distress.  HEENT: AT/[place], no external lesions.  Eyes: Conjunctiva clear and no icterus. Neck: Neck supple  Lungs: Respirations not labored Neurologic: Alert, oriented, normal speech Extremities / Skin: Dry.  Psychiatric: Does not appear depressed or anxious  Diabetic Foot Exam - Simple   No data filed    LABS Reviewed Lab Results  Component Value Date   HGBA1C 6.3 (H) 10/07/2023   HGBA1C 7.0 (H) 05/30/2023   HGBA1C 7.1 (H) 04/07/2023   Lab Results  Component Value Date   FRUCTOSAMINE 249 06/01/2018   FRUCTOSAMINE 259 09/19/2015   Lab Results  Component Value Date   CHOL 118 10/07/2023   HDL 46 10/07/2023   LDLCALC 57 10/07/2023   TRIG 72 10/07/2023   CHOLHDL 2.6 10/07/2023   Lab Results  Component Value Date   MICRALBCREAT 7 12/02/2023   MICRALBCREAT 8 10/07/2023   Lab Results  Component Value Date   CREATININE 0.97 12/02/2023   Lab Results  Component Value Date   GFR 75.93 12/02/2022    ASSESSMENT / PLAN  1. Controlled type 2 diabetes mellitus without complication, without long-term current use of insulin  (HCC)    Diabetes Mellitus type 2, complicated by no known complication. - Diabetic status / severity: Controlled  Lab Results  Component Value Date   HGBA1C 6.3 (H) 10/07/2023    - Hemoglobin A1c goal : <6.5%  Improvement in diabetes control.  Acceptable glucometer  data.  - Medications: No change.  I) Synjardy  12.05/998 mg 2 tablets daily.  - Home glucose testing: Few times a week at different times of the day.  Advised to check in the morning fasting as well.  - Discussed/ Gave Hypoglycemia treatment plan.  # Consult : not required at this time.   # Annual urine for microalbuminuria/ creatinine ratio, no microalbuminuria currently, continue ACE/ARB /losartan . Last  Lab Results  Component Value Date   MICRALBCREAT 7 12/02/2023    # Foot check nightly.  # Annual dilated diabetic eye exams.   - Diet: Make healthy diabetic food choices - Life style / activity / exercise: Discussed.  Patient has planned to walk indoors for exercise.  2. Blood pressure  -  BP Readings from Last 1 Encounters:  12/05/23 122/74    - Control is in target.  - No change in current plans.  3. Lipid status / Hyperlipidemia - Last  Lab Results  Component Value Date   LDLCALC 57 10/07/2023   - Continue atorvastatin  10 mg daily.  Managed by primary care provider.  Diagnoses and all orders for this visit:  Controlled type 2 diabetes mellitus without complication, without long-term current use of insulin  (HCC) -     Basic metabolic panel with GFR -     Hemoglobin A1c     DISPOSITION Follow up in clinic in 6  months suggested.  Labs prior to follow-up visit as ordered.   All questions answered and patient verbalized understanding of the plan.  Sylar Voong, MD [facility] Endocrinology [facility] [address] [place], [address] [place], [place] [address] Phone # [phone]  At least part of this note was generated using voice recognition software. Inadvertent word errors may have occurred, which were not recognized during the proofreading process.

## 2023-12-30 ENCOUNTER — Ambulatory Visit (HOSPITAL_COMMUNITY): Admission: RE | Admit: 2023-12-30 | Source: Ambulatory Visit

## 2024-01-07 DIAGNOSIS — R296 Repeated falls: Secondary | ICD-10-CM | POA: Diagnosis not present

## 2024-01-07 DIAGNOSIS — H9201 Otalgia, right ear: Secondary | ICD-10-CM | POA: Diagnosis not present

## 2024-01-21 ENCOUNTER — Ambulatory Visit: Admitting: Internal Medicine

## 2024-02-04 LAB — HM DIABETES EYE EXAM

## 2024-02-05 ENCOUNTER — Encounter: Payer: Self-pay | Admitting: Internal Medicine

## 2024-02-06 ENCOUNTER — Encounter: Payer: Self-pay | Admitting: Internal Medicine

## 2024-03-17 ENCOUNTER — Ambulatory Visit (HOSPITAL_COMMUNITY)

## 2024-04-12 ENCOUNTER — Other Ambulatory Visit: Payer: Self-pay

## 2024-04-15 ENCOUNTER — Ambulatory Visit: Payer: Self-pay | Admitting: Internal Medicine

## 2024-05-25 ENCOUNTER — Other Ambulatory Visit

## 2024-06-01 ENCOUNTER — Ambulatory Visit: Admitting: Endocrinology
